# Patient Record
Sex: Female | Born: 1960 | Race: White | Hispanic: No | Smoking: Former smoker
Health system: Southern US, Community
[De-identification: ages and names within clinical notes are randomized; demographics above are authoritative.]

## PROBLEM LIST (undated history)

## (undated) DIAGNOSIS — F419 Anxiety disorder, unspecified: Secondary | ICD-10-CM

## (undated) DIAGNOSIS — E7800 Pure hypercholesterolemia, unspecified: Secondary | ICD-10-CM

## (undated) DIAGNOSIS — I10 Essential (primary) hypertension: Secondary | ICD-10-CM

## (undated) DIAGNOSIS — C801 Malignant (primary) neoplasm, unspecified: Secondary | ICD-10-CM

## (undated) HISTORY — PX: ABDOMINAL HYSTERECTOMY: SHX81

---

## 2001-09-23 ENCOUNTER — Other Ambulatory Visit: Admission: RE | Admit: 2001-09-23 | Discharge: 2001-09-23 | Payer: Self-pay | Admitting: Obstetrics and Gynecology

## 2003-06-29 ENCOUNTER — Encounter: Payer: Self-pay | Admitting: Family Medicine

## 2003-06-29 ENCOUNTER — Encounter: Admission: RE | Admit: 2003-06-29 | Discharge: 2003-06-29 | Payer: Self-pay | Admitting: Family Medicine

## 2004-08-22 ENCOUNTER — Other Ambulatory Visit: Admission: RE | Admit: 2004-08-22 | Discharge: 2004-08-22 | Payer: Self-pay | Admitting: Family Medicine

## 2005-02-26 ENCOUNTER — Ambulatory Visit (HOSPITAL_COMMUNITY): Admission: RE | Admit: 2005-02-26 | Discharge: 2005-02-26 | Payer: Self-pay | Admitting: Family Medicine

## 2005-09-06 ENCOUNTER — Other Ambulatory Visit: Admission: RE | Admit: 2005-09-06 | Discharge: 2005-09-06 | Payer: Self-pay | Admitting: Family Medicine

## 2007-01-08 ENCOUNTER — Other Ambulatory Visit: Admission: RE | Admit: 2007-01-08 | Discharge: 2007-01-08 | Payer: Self-pay | Admitting: Family Medicine

## 2007-12-18 ENCOUNTER — Other Ambulatory Visit: Admission: RE | Admit: 2007-12-18 | Discharge: 2007-12-18 | Payer: Self-pay | Admitting: Family Medicine

## 2008-12-26 ENCOUNTER — Other Ambulatory Visit: Admission: RE | Admit: 2008-12-26 | Discharge: 2008-12-26 | Payer: Self-pay | Admitting: Family Medicine

## 2008-12-27 ENCOUNTER — Encounter: Admission: RE | Admit: 2008-12-27 | Discharge: 2008-12-27 | Payer: Self-pay | Admitting: Family Medicine

## 2010-01-08 ENCOUNTER — Other Ambulatory Visit: Admission: RE | Admit: 2010-01-08 | Discharge: 2010-01-08 | Payer: Self-pay | Admitting: Family Medicine

## 2010-05-22 ENCOUNTER — Encounter: Admission: RE | Admit: 2010-05-22 | Discharge: 2010-05-22 | Payer: Self-pay | Admitting: Family Medicine

## 2011-01-11 ENCOUNTER — Other Ambulatory Visit: Payer: Self-pay | Admitting: Family Medicine

## 2011-01-11 ENCOUNTER — Other Ambulatory Visit (HOSPITAL_COMMUNITY)
Admission: RE | Admit: 2011-01-11 | Discharge: 2011-01-11 | Disposition: A | Discharge status: Home / Self Care | Payer: BC Managed Care – PPO | Source: Ambulatory Visit | Attending: Family Medicine | Admitting: Family Medicine

## 2011-01-11 DIAGNOSIS — Z124 Encounter for screening for malignant neoplasm of cervix: Secondary | ICD-10-CM | POA: Insufficient documentation

## 2011-01-14 ENCOUNTER — Other Ambulatory Visit: Payer: Self-pay | Admitting: Family Medicine

## 2011-01-24 ENCOUNTER — Other Ambulatory Visit: Payer: Self-pay | Admitting: Family Medicine

## 2011-11-21 ENCOUNTER — Emergency Department (HOSPITAL_COMMUNITY)
Admission: EM | Admit: 2011-11-21 | Discharge: 2011-11-21 | Disposition: A | Discharge status: Home / Self Care | Payer: BC Managed Care – PPO | Source: Home / Self Care

## 2011-11-21 ENCOUNTER — Encounter: Payer: Self-pay | Admitting: Emergency Medicine

## 2011-11-21 DIAGNOSIS — B9789 Other viral agents as the cause of diseases classified elsewhere: Secondary | ICD-10-CM

## 2011-11-21 DIAGNOSIS — B349 Viral infection, unspecified: Secondary | ICD-10-CM

## 2011-11-21 HISTORY — DX: Essential (primary) hypertension: I10

## 2011-11-21 HISTORY — DX: Pure hypercholesterolemia, unspecified: E78.00

## 2011-11-21 MED ORDER — GUAIFENESIN-CODEINE 100-10 MG/5ML PO SYRP: ORAL_SOLUTION | ORAL | Status: AC

## 2011-11-21 NOTE — ED Provider Notes (Signed)
Medical screening examination/treatment/procedure(s) were performed by non-physician practitioner and as supervising physician I was immediately available for consultation/collaboration.  Luiz Blare MD   Luiz Blare, MD 11/21/11 [phone]

## 2011-11-21 NOTE — ED Provider Notes (Signed)
History     CSN: [id number] Arrival date & time: 11/21/2011  8:31 AM   None     Chief Complaint  Patient presents with  . Fever    (Consider location/radiation/quality/duration/timing/severity/associated sxs/prior treatment) HPI Comments: Onset of symptoms 3 days ago. C/o body aches, chills, nasal congestion, cough, nausea and decreased appetite. Does not have a thermometer at home to check temp but has felt feverish. Has been taking Alka Seltzer Cold for her symptoms - "does not help."  Patient is a 50 y.o. female presenting with fever. The history is provided by the patient.  Fever Primary symptoms of the febrile illness include fever, fatigue, headaches, cough, nausea and myalgias. Primary symptoms do not include wheezing, shortness of breath, abdominal pain, vomiting, diarrhea or dysuria. The current episode started 3 to 5 days ago. This is a new problem. The problem has been gradually worsening.  The fever began 3 to 5 days ago. The fever has been unchanged since its onset. The maximum temperature recorded prior to her arrival was unknown.  The headache began more than 2 days ago. The headache developed suddenly. Headache is a new problem. The headache is present continuously. The headache is not associated with weakness.  The cough began 3 to 5 days ago. The cough is new. The cough is non-productive.  Myalgias began 3 to 5 days ago. The myalgias have been unchanged since their onset. The myalgias are generalized. The myalgias are aching. The myalgias are not associated with weakness, tenderness or swelling.    Past Medical History  Diagnosis Date  . Hypertension   . Elevated cholesterol     Past Surgical History  Procedure Date  . Abdominal hysterectomy     History reviewed. No pertinent family history.  History  Substance Use Topics  . Smoking status: Current Everyday Smoker -- 0.5 packs/day  . Smokeless tobacco: Not on file  . Alcohol Use: Yes    OB History    Grav Para Term Preterm Abortions TAB SAB Ect Mult Living                  Review of Systems  Constitutional: Positive for fever, chills, appetite change and fatigue.  HENT: Positive for congestion. Negative for ear pain, sore throat, rhinorrhea, postnasal drip and sinus pressure.   Respiratory: Positive for cough. Negative for shortness of breath and wheezing.   Gastrointestinal: Positive for nausea. Negative for vomiting, abdominal pain and diarrhea.  Genitourinary: Negative for dysuria, frequency and decreased urine volume.  Musculoskeletal: Positive for myalgias.  Neurological: Positive for headaches. Negative for weakness.    Allergies  Review of patient's allergies indicates no known allergies.  Home Medications   Current Outpatient Rx  Name Route Sig Dispense Refill  . PRAVASTATIN SODIUM 20 MG PO TABS Oral Take 20 mg by mouth daily.      . TRIAMTERENE-HCTZ 37.5-25 MG PO TABS Oral Take 1 tablet by mouth daily.      . GUAIFENESIN-CODEINE 100-10 MG/5ML PO SYRP  1-2 tsp every 6 hrs prn cough 120 mL 0    BP 137/92  Pulse 109  Temp(Src) 99.1 F (37.3 C) (Oral)  Resp 18  SpO2 96%  Physical Exam  Nursing note and vitals reviewed. Constitutional: She appears well-developed and well-nourished. No distress.  HENT:  Head: Normocephalic and atraumatic.  Right Ear: Tympanic membrane, external ear and ear canal normal.  Left Ear: Tympanic membrane, external ear and ear canal normal.  Nose: Nose normal.  Mouth/Throat: Uvula is  midline, oropharynx is clear and moist and mucous membranes are normal. No oropharyngeal exudate, posterior oropharyngeal edema or posterior oropharyngeal erythema.  Neck: Neck supple.  Cardiovascular: Normal rate, regular rhythm and normal heart sounds.   Pulmonary/Chest: Effort normal and breath sounds normal. No respiratory distress.  Abdominal: Soft. Bowel sounds are normal. She exhibits no distension and no mass. There is no tenderness. There is no  guarding.  Lymphadenopathy:    She has no cervical adenopathy.  Neurological: She is alert.  Skin: Skin is warm and dry.  Psychiatric: She has a normal mood and affect.    ED Course  Procedures (including critical care time)  Labs Reviewed - No data to display No results found.   1. Viral infection       MDM  Flu-like-symptoms w/o documented fever of 100.4 or higher. Discussed with pt viral illness.        Melody Comas, [place] 11/21/11 [phone]

## 2011-11-21 NOTE — ED Notes (Signed)
C/o fever, unsure or degree, not checked at home.  Reports chest congestion, generalized aching, denies n/v, but does not have an appetite.  Denies sore throat, denies ear pain.  C/o headache.  Onset Monday of symptoms.

## 2012-09-07 ENCOUNTER — Other Ambulatory Visit (HOSPITAL_COMMUNITY): Payer: Self-pay | Admitting: Family Medicine

## 2012-09-07 DIAGNOSIS — R0602 Shortness of breath: Secondary | ICD-10-CM

## 2012-09-14 ENCOUNTER — Ambulatory Visit (HOSPITAL_COMMUNITY)
Admission: RE | Admit: 2012-09-14 | Discharge: 2012-09-14 | Disposition: A | Discharge status: Home / Self Care | Payer: BC Managed Care – PPO | Source: Ambulatory Visit | Attending: Family Medicine | Admitting: Family Medicine

## 2012-09-14 DIAGNOSIS — R0602 Shortness of breath: Secondary | ICD-10-CM | POA: Insufficient documentation

## 2012-09-14 MED ORDER — ALBUTEROL SULFATE (5 MG/ML) 0.5% IN NEBU
2.5000 mg | INHALATION_SOLUTION | Freq: Once | RESPIRATORY_TRACT | Status: AC
  Administered 2012-09-14: 2.5 mg via RESPIRATORY_TRACT

## 2014-09-28 ENCOUNTER — Other Ambulatory Visit (HOSPITAL_COMMUNITY)
Admission: RE | Admit: 2014-09-28 | Discharge: 2014-09-28 | Disposition: A | Discharge status: Home / Self Care | Payer: BC Managed Care – PPO | Source: Ambulatory Visit | Attending: Family Medicine | Admitting: Family Medicine

## 2014-09-28 ENCOUNTER — Other Ambulatory Visit: Payer: Self-pay | Admitting: Family Medicine

## 2014-09-28 DIAGNOSIS — Z1272 Encounter for screening for malignant neoplasm of vagina: Secondary | ICD-10-CM | POA: Diagnosis present

## 2014-09-30 LAB — CYTOLOGY - PAP

## 2015-05-30 ENCOUNTER — Other Ambulatory Visit: Payer: Self-pay | Admitting: Radiology

## 2018-09-02 ENCOUNTER — Other Ambulatory Visit: Payer: Self-pay | Admitting: Family Medicine

## 2018-09-02 ENCOUNTER — Other Ambulatory Visit (HOSPITAL_COMMUNITY)
Admission: RE | Admit: 2018-09-02 | Discharge: 2018-09-02 | Disposition: A | Discharge status: Home / Self Care | Payer: 59 | Source: Ambulatory Visit | Attending: Family Medicine | Admitting: Family Medicine

## 2018-09-02 DIAGNOSIS — Z01411 Encounter for gynecological examination (general) (routine) with abnormal findings: Secondary | ICD-10-CM | POA: Diagnosis not present

## 2018-09-04 LAB — CYTOLOGY - PAP
DIAGNOSIS: NEGATIVE
HPV: NOT DETECTED

## 2019-10-20 ENCOUNTER — Other Ambulatory Visit: Payer: Self-pay

## 2019-10-20 DIAGNOSIS — Z20822 Contact with and (suspected) exposure to covid-19: Secondary | ICD-10-CM

## 2019-10-22 LAB — NOVEL CORONAVIRUS, NAA: SARS-CoV-2, NAA: NOT DETECTED

## 2020-02-24 ENCOUNTER — Ambulatory Visit: Payer: 59 | Attending: Internal Medicine

## 2020-02-24 DIAGNOSIS — Z23 Encounter for immunization: Secondary | ICD-10-CM

## 2020-02-24 NOTE — Progress Notes (Signed)
   Covid-19 Vaccination Clinic  Name:  ANZAL BARTNICK    MRN: [id number] DOB: Mar 13, 1961  02/24/2020  Ms. Mathes was observed post Covid-19 immunization for 15 minutes without incident. She was provided with Vaccine Information Sheet and instruction to access the V-Safe system.   Ms. Panella was instructed to call 911 with any severe reactions post vaccine: Marland Kitchen Difficulty breathing  . Swelling of face and throat  . A fast heartbeat  . A bad rash all over body  . Dizziness and weakness   Immunizations Administered    Name Date Dose VIS Date Route   Pfizer COVID-19 Vaccine 02/24/2020  4:42 PM 0.3 mL 11/19/2019 Intramuscular   Manufacturer: [employer], [employer]   Lot: [id number]   NDC: [address]-2

## 2020-03-21 ENCOUNTER — Ambulatory Visit: Payer: 59 | Attending: Internal Medicine

## 2020-03-21 DIAGNOSIS — Z23 Encounter for immunization: Secondary | ICD-10-CM

## 2020-03-21 NOTE — Progress Notes (Signed)
   Covid-19 Vaccination Clinic  Name:  Kristina Howe    MRN: [id number] DOB: [birth year]  03/21/2020  [name] was observed post Covid-19 immunization for 15 minutes without incident. She was provided with Vaccine Information Sheet and instruction to access the V-Safe system.   [name] was instructed to call 911 with any severe reactions post vaccine: Marland Kitchen Difficulty breathing  . Swelling of face and throat  . A fast heartbeat  . A bad rash all over body  . Dizziness and weakness   Immunizations Administered    Name Date Dose VIS Date Route   Pfizer COVID-19 Vaccine 03/21/2020  8:27 AM 0.3 mL 11/19/2019 Intramuscular   Manufacturer: [employer], [employer]   Lot: [id number]   NDC: [address]-4

## 2020-09-07 ENCOUNTER — Other Ambulatory Visit: Payer: Self-pay | Admitting: Family Medicine

## 2020-09-07 ENCOUNTER — Other Ambulatory Visit: Payer: 59

## 2020-09-07 DIAGNOSIS — R1084 Generalized abdominal pain: Secondary | ICD-10-CM

## 2020-09-08 ENCOUNTER — Other Ambulatory Visit: Payer: Self-pay | Admitting: Family Medicine

## 2020-09-08 ENCOUNTER — Ambulatory Visit
Admission: RE | Admit: 2020-09-08 | Discharge: 2020-09-08 | Disposition: A | Discharge status: Home / Self Care | Payer: BC Managed Care – PPO | Source: Ambulatory Visit | Attending: Family Medicine | Admitting: Family Medicine

## 2020-09-08 DIAGNOSIS — N281 Cyst of kidney, acquired: Secondary | ICD-10-CM | POA: Diagnosis not present

## 2020-09-08 DIAGNOSIS — I700 Atherosclerosis of aorta: Secondary | ICD-10-CM | POA: Diagnosis not present

## 2020-09-08 DIAGNOSIS — R1084 Generalized abdominal pain: Secondary | ICD-10-CM

## 2020-09-08 DIAGNOSIS — J439 Emphysema, unspecified: Secondary | ICD-10-CM | POA: Diagnosis not present

## 2020-09-08 DIAGNOSIS — Z90710 Acquired absence of both cervix and uterus: Secondary | ICD-10-CM | POA: Diagnosis not present

## 2020-09-08 MED ORDER — IOPAMIDOL (ISOVUE-300) INJECTION 61%
100.0000 mL | Freq: Once | INTRAVENOUS | Status: AC | PRN
  Administered 2020-09-08: 100 mL via INTRAVENOUS

## 2020-10-05 DIAGNOSIS — Z0000 Encounter for general adult medical examination without abnormal findings: Secondary | ICD-10-CM | POA: Diagnosis not present

## 2020-10-05 DIAGNOSIS — E7800 Pure hypercholesterolemia, unspecified: Secondary | ICD-10-CM | POA: Diagnosis not present

## 2020-10-05 DIAGNOSIS — Z23 Encounter for immunization: Secondary | ICD-10-CM | POA: Diagnosis not present

## 2020-10-05 DIAGNOSIS — I10 Essential (primary) hypertension: Secondary | ICD-10-CM | POA: Diagnosis not present

## 2020-10-05 DIAGNOSIS — M859 Disorder of bone density and structure, unspecified: Secondary | ICD-10-CM | POA: Diagnosis not present

## 2020-12-09 HISTORY — PX: UPPER GI ENDOSCOPY: SHX6162

## 2021-03-01 DIAGNOSIS — H52203 Unspecified astigmatism, bilateral: Secondary | ICD-10-CM | POA: Diagnosis not present

## 2021-03-01 DIAGNOSIS — H524 Presbyopia: Secondary | ICD-10-CM | POA: Diagnosis not present

## 2021-03-01 DIAGNOSIS — H2513 Age-related nuclear cataract, bilateral: Secondary | ICD-10-CM | POA: Diagnosis not present

## 2021-04-05 DIAGNOSIS — E7800 Pure hypercholesterolemia, unspecified: Secondary | ICD-10-CM | POA: Diagnosis not present

## 2021-04-05 DIAGNOSIS — R454 Irritability and anger: Secondary | ICD-10-CM | POA: Diagnosis not present

## 2021-04-05 DIAGNOSIS — F411 Generalized anxiety disorder: Secondary | ICD-10-CM | POA: Diagnosis not present

## 2021-04-05 DIAGNOSIS — E559 Vitamin D deficiency, unspecified: Secondary | ICD-10-CM | POA: Diagnosis not present

## 2021-04-05 DIAGNOSIS — I10 Essential (primary) hypertension: Secondary | ICD-10-CM | POA: Diagnosis not present

## 2021-04-13 DIAGNOSIS — Z23 Encounter for immunization: Secondary | ICD-10-CM | POA: Diagnosis not present

## 2021-06-15 DIAGNOSIS — Z1231 Encounter for screening mammogram for malignant neoplasm of breast: Secondary | ICD-10-CM | POA: Diagnosis not present

## 2021-06-15 DIAGNOSIS — Z780 Asymptomatic menopausal state: Secondary | ICD-10-CM | POA: Diagnosis not present

## 2021-06-15 DIAGNOSIS — M8589 Other specified disorders of bone density and structure, multiple sites: Secondary | ICD-10-CM | POA: Diagnosis not present

## 2021-12-14 ENCOUNTER — Other Ambulatory Visit: Payer: Self-pay | Admitting: Family Medicine

## 2021-12-14 ENCOUNTER — Ambulatory Visit
Admission: RE | Admit: 2021-12-14 | Discharge: 2021-12-14 | Disposition: A | Discharge status: Home / Self Care | Payer: Self-pay | Source: Ambulatory Visit | Attending: Family Medicine | Admitting: Family Medicine

## 2021-12-14 DIAGNOSIS — R079 Chest pain, unspecified: Secondary | ICD-10-CM

## 2021-12-14 DIAGNOSIS — R059 Cough, unspecified: Secondary | ICD-10-CM

## 2022-04-12 ENCOUNTER — Emergency Department (HOSPITAL_BASED_OUTPATIENT_CLINIC_OR_DEPARTMENT_OTHER): Payer: No Typology Code available for payment source

## 2022-04-12 ENCOUNTER — Encounter (HOSPITAL_BASED_OUTPATIENT_CLINIC_OR_DEPARTMENT_OTHER): Payer: Self-pay

## 2022-04-12 ENCOUNTER — Emergency Department (HOSPITAL_BASED_OUTPATIENT_CLINIC_OR_DEPARTMENT_OTHER)
Admission: EM | Admit: 2022-04-12 | Discharge: 2022-04-12 | Disposition: A | Discharge status: Home / Self Care | Payer: No Typology Code available for payment source | Attending: Emergency Medicine | Admitting: Emergency Medicine

## 2022-04-12 ENCOUNTER — Other Ambulatory Visit: Payer: Self-pay

## 2022-04-12 DIAGNOSIS — R7989 Other specified abnormal findings of blood chemistry: Secondary | ICD-10-CM | POA: Insufficient documentation

## 2022-04-12 DIAGNOSIS — R1084 Generalized abdominal pain: Secondary | ICD-10-CM | POA: Insufficient documentation

## 2022-04-12 DIAGNOSIS — R110 Nausea: Secondary | ICD-10-CM | POA: Insufficient documentation

## 2022-04-12 DIAGNOSIS — R1032 Left lower quadrant pain: Secondary | ICD-10-CM | POA: Diagnosis not present

## 2022-04-12 LAB — COMPREHENSIVE METABOLIC PANEL
ALT: 8 U/L (ref 0–44)
AST: 15 U/L (ref 15–41)
Albumin: 4.8 g/dL (ref 3.5–5.0)
Alkaline Phosphatase: 62 U/L (ref 38–126)
Anion gap: 11 (ref 5–15)
BUN: 21 mg/dL — ABNORMAL HIGH (ref 6–20)
CO2: 27 mmol/L (ref 22–32)
Calcium: 10.6 mg/dL — ABNORMAL HIGH (ref 8.9–10.3)
Chloride: 102 mmol/L (ref 98–111)
Creatinine, Ser: 1.21 mg/dL — ABNORMAL HIGH (ref 0.44–1.00)
GFR, Estimated: 52 mL/min — ABNORMAL LOW (ref >60)
Glucose, Bld: 85 mg/dL (ref 70–99)
Potassium: 3.2 mmol/L — ABNORMAL LOW (ref 3.5–5.1)
Sodium: 140 mmol/L (ref 135–145)
Total Bilirubin: 1.1 mg/dL (ref 0.3–1.2)
Total Protein: 7.3 g/dL (ref 6.5–8.1)

## 2022-04-12 LAB — URINALYSIS, ROUTINE W REFLEX MICROSCOPIC
Bilirubin Urine: NEGATIVE
Glucose, UA: NEGATIVE mg/dL
Hgb urine dipstick: NEGATIVE
Ketones, ur: NEGATIVE mg/dL
Leukocytes,Ua: NEGATIVE
Nitrite: NEGATIVE
Protein, ur: NEGATIVE mg/dL
Specific Gravity, Urine: 1.011 (ref 1.005–1.030)
pH: 7 (ref 5.0–8.0)

## 2022-04-12 LAB — LIPASE, BLOOD: Lipase: 11 U/L (ref 11–51)

## 2022-04-12 LAB — CBC
HCT: 38.8 % (ref 36.0–46.0)
Hemoglobin: 13.1 g/dL (ref 12.0–15.0)
MCH: 32.2 pg (ref 26.0–34.0)
MCHC: 33.8 g/dL (ref 30.0–36.0)
MCV: 95.3 fL (ref 80.0–100.0)
Platelets: 290 10*3/uL (ref 150–400)
RBC: 4.07 MIL/uL (ref 3.87–5.11)
RDW: 13.2 % (ref 11.5–15.5)
WBC: 6.5 10*3/uL (ref 4.0–10.5)
nRBC: 0 % (ref 0.0–0.2)

## 2022-04-12 MED ORDER — IOHEXOL 300 MG/ML  SOLN
100.0000 mL | Freq: Once | INTRAMUSCULAR | Status: AC | PRN
  Administered 2022-04-12: 100 mL via INTRAVENOUS

## 2022-04-12 MED ORDER — POTASSIUM CHLORIDE CRYS ER 20 MEQ PO TBCR
40.0000 meq | EXTENDED_RELEASE_TABLET | Freq: Once | ORAL | Status: AC
  Administered 2022-04-12: 40 meq via ORAL
  Filled 2022-04-12: qty 2

## 2022-04-12 MED ORDER — ALUM & MAG HYDROXIDE-SIMETH 200-200-20 MG/5ML PO SUSP
30.0000 mL | Freq: Once | ORAL | Status: AC
  Administered 2022-04-12: 30 mL via ORAL
  Filled 2022-04-12: qty 30

## 2022-04-12 MED ORDER — LIDOCAINE VISCOUS HCL 2 % MT SOLN
15.0000 mL | Freq: Once | OROMUCOSAL | Status: AC
  Administered 2022-04-12: 15 mL via ORAL
  Filled 2022-04-12: qty 15

## 2022-04-12 MED ORDER — OMEPRAZOLE 20 MG PO CPDR: 20.0000 mg | DELAYED_RELEASE_CAPSULE | Freq: Every day | ORAL | 0 refills | Status: DC

## 2022-04-12 NOTE — ED Triage Notes (Signed)
Patient here POV from Home. ? ?Endorses ABD Pain for approximately 1 Month Intermittently. Seen by PCP approximately 1 Week PTA and Lab Studies were normal. Instructed to Seek ED Evaluation if worsened.  ? ?Pain is Mostly oriented to Left ABD. Moderate Nausea with Pain. No Emesis. No Changes in BM. No Known Fevers. No Urinary Symptoms.  ? ?NAD Noted during Triage. A&Ox4. GCS 15. Ambulatory.  ?

## 2022-04-12 NOTE — ED Provider Notes (Signed)
?[facility] ?Provider Note ? ? ?CSN: [id number] ?Arrival date & time: 04/12/22  1045 ? ?  ? ?History ? ?Chief Complaint  ?Patient presents with  ? Abdominal Pain  ? ? ?Kristina Howe is a 61 y.o. female. ? ?Patient with history of melanoma and hypertension presents today with complaints of abdominal pain. She states that same has been present over the past few months but has worsened in the past week. She saw her PCP for same last week and had labs drawn which were normal, was told her pain could be stress related. States that since then her pain has become more frequent and severe. Pain is localized to the LLQ and is associated with nausea and 'feeling a need to vomit' without being able to do so. She denies any diarrhea, is having regular nonbloody bowel movements. Also denies hematuria, dysuria, hematochezia, or melena. Does state that her appetite has been poor recently due to pain. States that she has had a hysterectomy in the past, no other history of abdominal surgeries.  ? ?The history is provided by the patient. No language interpreter was used.  ?Abdominal Pain ?Associated symptoms: nausea   ?Associated symptoms: no chills, no constipation, no diarrhea, no dysuria, no fever and no vomiting   ? ?  ? ?Home Medications ?Prior to Admission medications   ?Medication Sig Start Date End Date Taking? Authorizing Provider  ?pravastatin (PRAVACHOL) 20 MG tablet Take 20 mg by mouth daily.      [provider]  ?triamterene-hydrochlorothiazide (MAXZIDE-25) 37.5-25 MG per tablet Take 1 tablet by mouth daily.      [provider]  ?   ? ?Allergies    ?Patient has no known allergies.   ? ?Review of Systems   ?Review of Systems  ?Constitutional:  Negative for chills and fever.  ?Gastrointestinal:  Positive for abdominal pain and nausea. Negative for constipation, diarrhea and vomiting.  ?Genitourinary:  Negative for dysuria.  ?All other systems reviewed and are  negative. ? ?Physical Exam ?Updated Vital Signs ?BP 137/81   Pulse (!) 59   Temp 97.9 ?F (36.6 ?C) (Oral)   Resp 17   Ht 5\' 9"  (1.753 m)   Wt 52.2 kg   SpO2 99%   BMI 16.98 kg/m?  ?Physical Exam ?Vitals and nursing note reviewed.  ?Constitutional:   ?   General: She is not in acute distress. ?   Appearance: Normal appearance. She is well-developed and normal weight. She is not ill-appearing, toxic-appearing or diaphoretic.  ?HENT:  ?   Head: Normocephalic and atraumatic.  ?Cardiovascular:  ?   Rate and Rhythm: Normal rate.  ?Pulmonary:  ?   Effort: Pulmonary effort is normal. No respiratory distress.  ?Abdominal:  ?   General: Abdomen is flat.  ?   Palpations: Abdomen is soft.  ?   Tenderness: There is abdominal tenderness in the left lower quadrant.  ?Musculoskeletal:     ?   General: Normal range of motion.  ?   Cervical back: Normal range of motion.  ?Skin: ?   General: Skin is warm and dry.  ?Neurological:  ?   General: No focal deficit present.  ?   Mental Status: She is alert.  ?Psychiatric:     ?   Mood and Affect: Mood normal.     ?   Behavior: Behavior normal.  ? ? ?ED Results / Procedures / Treatments   ?Labs ?(all labs ordered are listed, but only abnormal results are  displayed) ?Labs Reviewed  ?COMPREHENSIVE METABOLIC PANEL - Abnormal; Notable for the following components:  ?    Result Value  ? Potassium 3.2 (*)   ? BUN 21 (*)   ? Creatinine, Ser 1.21 (*)   ? Calcium 10.6 (*)   ? GFR, Estimated 52 (*)   ? All other components within normal limits  ?LIPASE, BLOOD  ?CBC  ?URINALYSIS, ROUTINE W REFLEX MICROSCOPIC  ? ? ?EKG ?None ? ?Radiology ?CT ABDOMEN PELVIS W CONTRAST ? ?Result Date: 04/12/2022 ?CLINICAL DATA:  Left lower quadrant pain for several days. EXAM: CT ABDOMEN AND PELVIS WITH CONTRAST TECHNIQUE: Multidetector CT imaging of the abdomen and pelvis was performed using the standard protocol following bolus administration of intravenous contrast. RADIATION DOSE REDUCTION: This exam was  performed according to the departmental dose-optimization program which includes automated exposure control, adjustment of the mA and/or kV according to patient size and/or use of iterative reconstruction technique. CONTRAST:  OMNIPAQUE IOHEXOL 300 MG/ML  SOLN COMPARISON:  09/08/2020 FINDINGS: Lower Chest: No acute findings. Hepatobiliary: A 1.5 cm low-attenuation lesion is seen in the anterior dome of the left hepatic lobe which was not seen on prior study. This has nonspecific characteristics. No other liver lesions identified. Gallbladder is unremarkable. No evidence of biliary ductal dilatation. Pancreas:  No mass or inflammatory changes. Spleen: Within normal limits in size and appearance. Adrenals/Urinary Tract: No masses identified. Tiny benign left renal cysts again noted (no followup imaging is recommended). No evidence of ureteral calculi or hydronephrosis. Stomach/Bowel: Mild gastric wall thickening is seen with a focal outpouching from the lesser curvature of the proximal gastric body measuring approximately 1 cm on image 51/5, which could represent a small ulcer or diverticulum. No evidence of bowel obstruction or other inflammatory process. No abnormal fluid collections. Vascular/Lymphatic: No pathologically enlarged lymph nodes. No acute vascular findings. Aortic atherosclerotic calcification noted. Reproductive: Prior hysterectomy noted. Adnexal regions are unremarkable in appearance. Other:  None. Musculoskeletal:  No suspicious bone lesions identified. IMPRESSION: 1 cm focal outpouching from the lesser curvature of the proximal gastric body, which could represent a gastric ulcer or diverticulum. Consider upper endoscopy for further evaluation. 1.5 cm indeterminate low-attenuation lesion in the anterior dome of the left hepatic lobe, not seen on prior exam. Abdomen MRI without and with contrast is recommended for further characterization. Aortic Atherosclerosis (ICD10-I70.0). Electronically  Signed   By: Danae Orleans M.D.   On: 04/12/2022 13:18   ? ?Procedures ?Procedures  ? ? ?Medications Ordered in ED ?Medications  ?potassium chloride SA (KLOR-CON M) CR tablet 40 mEq (has no administration in time range)  ? ? ?ED Course/ Medical Decision Making/ A&P ?  ?                        ?Medical Decision Making ?Amount and/or Complexity of Data Reviewed ?Labs: ordered. ?Radiology: ordered. ? ?Risk ?OTC drugs. ?Prescription drug management. ? ? ?This patient presents to the ED for concern of abdominal pain, this involves an extensive number of treatment options, and is a complaint that carries with it a high risk of complications and morbidity. ? ? ?Co morbidities that complicate the patient evaluation ? ?Hypertension, hyperlipidemia ? ? ?Lab Tests: ? ?I Ordered, and personally interpreted labs.  The pertinent results include:  K 3.2, mild creatinine elevation at 1.21. No leukocytosis or anemia. No other acute laboratory findings ? ?Imaging Studies ordered: ? ?I ordered imaging studies including CT abdomen pelvis with contrast  ?I  independently visualized and interpreted imaging which showed  ?1 cm focal outpouching from the lesser curvature of the proximal gastric body, which could represent a gastric ulcer or diverticulum. Consider upper endoscopy for further evaluation. ?1.5 cm indeterminate low-attenuation lesion in the anterior dome of the left hepatic lobe, not seen on prior exam. Abdomen MRI without and with contrast is recommended for further characterization.  ?I agree with the radiologist interpretation ? ? ?Problem List / ED Course / Critical interventions / Medication management ? ?I ordered medication including GI cocktail and oral potassium  for reflux and hypokalemia  ?Reevaluation of the patient after these medicines showed that the patient improved ?I have reviewed the patients home medicines and have made adjustments as needed ? ?Patient presents today with complaints of abdominal pain and  nausea for the past several months. She is afebrile, non-toxic appearing, and in no acute distress with reassuring vital signs. Patient's pain and other symptoms adequately managed in emergency department. Labs, imaging and vitals review

## 2022-04-12 NOTE — Discharge Instructions (Addendum)
As we discussed, your work-up in the ER today was reassuring for acute abnormalities.  Given that you did have some relief with the liquid suspension that you were given in the ER today I have some suspicion that your symptoms may be related to reflux.  I have therefore given you a prescription for Prilosec which is an acid reducing medication for you to take as prescribed 30 minutes before your biggest meal of the day.  I have also given you a referral to a gastroenterologist with a number to call for you to schedule an appointment with for further evaluation and management. ? ?Return if development of any new or worsening symptoms. ?

## 2022-12-09 HISTORY — PX: COLONOSCOPY: SHX174

## 2023-02-17 DIAGNOSIS — Z1211 Encounter for screening for malignant neoplasm of colon: Secondary | ICD-10-CM | POA: Diagnosis not present

## 2023-02-17 DIAGNOSIS — K648 Other hemorrhoids: Secondary | ICD-10-CM | POA: Diagnosis not present

## 2023-03-22 DIAGNOSIS — D485 Neoplasm of uncertain behavior of skin: Secondary | ICD-10-CM | POA: Diagnosis not present

## 2023-03-22 DIAGNOSIS — C44529 Squamous cell carcinoma of skin of other part of trunk: Secondary | ICD-10-CM | POA: Diagnosis not present

## 2023-03-22 DIAGNOSIS — Z08 Encounter for follow-up examination after completed treatment for malignant neoplasm: Secondary | ICD-10-CM | POA: Diagnosis not present

## 2023-03-22 DIAGNOSIS — C44729 Squamous cell carcinoma of skin of left lower limb, including hip: Secondary | ICD-10-CM | POA: Diagnosis not present

## 2023-03-22 DIAGNOSIS — R229 Localized swelling, mass and lump, unspecified: Secondary | ICD-10-CM | POA: Diagnosis not present

## 2023-03-22 DIAGNOSIS — D229 Melanocytic nevi, unspecified: Secondary | ICD-10-CM | POA: Diagnosis not present

## 2023-03-22 DIAGNOSIS — Z85828 Personal history of other malignant neoplasm of skin: Secondary | ICD-10-CM | POA: Diagnosis not present

## 2023-03-22 DIAGNOSIS — L821 Other seborrheic keratosis: Secondary | ICD-10-CM | POA: Diagnosis not present

## 2023-03-22 DIAGNOSIS — C44519 Basal cell carcinoma of skin of other part of trunk: Secondary | ICD-10-CM | POA: Diagnosis not present

## 2023-03-22 DIAGNOSIS — C44521 Squamous cell carcinoma of skin of breast: Secondary | ICD-10-CM | POA: Diagnosis not present

## 2023-03-22 DIAGNOSIS — L814 Other melanin hyperpigmentation: Secondary | ICD-10-CM | POA: Diagnosis not present

## 2023-04-29 DIAGNOSIS — F17200 Nicotine dependence, unspecified, uncomplicated: Secondary | ICD-10-CM | POA: Diagnosis not present

## 2023-04-29 DIAGNOSIS — E7800 Pure hypercholesterolemia, unspecified: Secondary | ICD-10-CM | POA: Diagnosis not present

## 2023-04-29 DIAGNOSIS — R454 Irritability and anger: Secondary | ICD-10-CM | POA: Diagnosis not present

## 2023-04-29 DIAGNOSIS — F411 Generalized anxiety disorder: Secondary | ICD-10-CM | POA: Diagnosis not present

## 2023-04-29 DIAGNOSIS — M8580 Other specified disorders of bone density and structure, unspecified site: Secondary | ICD-10-CM | POA: Diagnosis not present

## 2023-04-29 DIAGNOSIS — I10 Essential (primary) hypertension: Secondary | ICD-10-CM | POA: Diagnosis not present

## 2023-04-29 DIAGNOSIS — E559 Vitamin D deficiency, unspecified: Secondary | ICD-10-CM | POA: Diagnosis not present

## 2023-05-16 DIAGNOSIS — L905 Scar conditions and fibrosis of skin: Secondary | ICD-10-CM | POA: Diagnosis not present

## 2023-05-16 DIAGNOSIS — C44519 Basal cell carcinoma of skin of other part of trunk: Secondary | ICD-10-CM | POA: Diagnosis not present

## 2023-05-16 DIAGNOSIS — D045 Carcinoma in situ of skin of trunk: Secondary | ICD-10-CM | POA: Diagnosis not present

## 2023-05-16 DIAGNOSIS — D0472 Carcinoma in situ of skin of left lower limb, including hip: Secondary | ICD-10-CM | POA: Diagnosis not present

## 2023-06-20 IMAGING — CT CT ABD-PELV W/ CM
2 of 5 series · 16 of 46 positions shown, 18 images · IV contrast (APPLIED)
Comparison: 09/08/2020

CLINICAL DATA: Left lower quadrant pain for several days.

EXAM:
CT ABDOMEN AND PELVIS WITH CONTRAST
TECHNIQUE: Multidetector CT imaging of the abdomen and pelvis was performed
using the standard protocol following bolus administration of
intravenous contrast.

[Series 2: abd pel w · axial · 0.64mm/px · z∈[+656,+1026]mm · 13 of 84 slices shown, 15 images]
[im 5/84  soft-tissue]
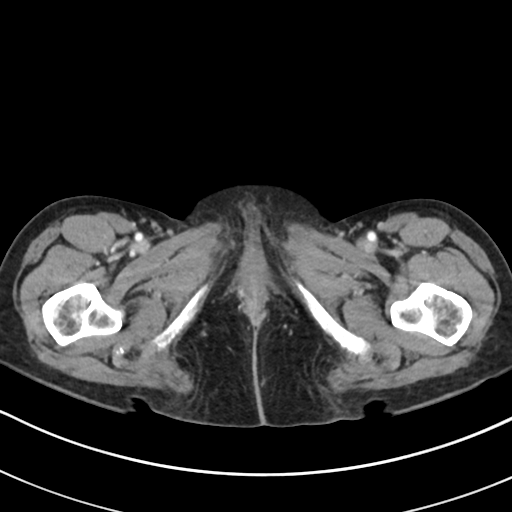
[im 5/84  bone]
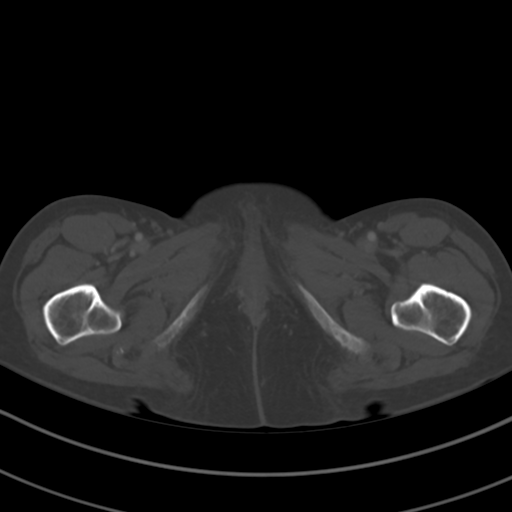
[im 10/84  soft-tissue]
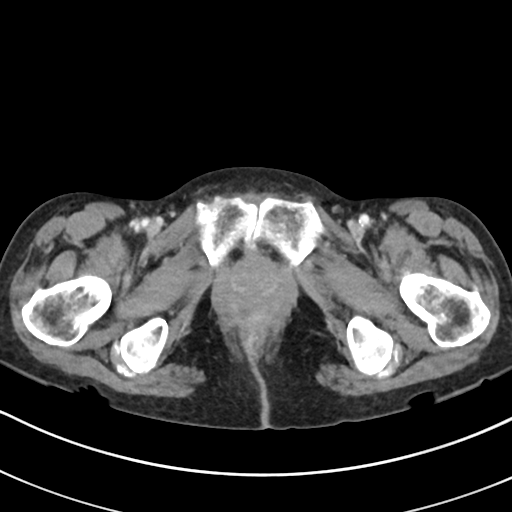
[im 19/84  soft-tissue]
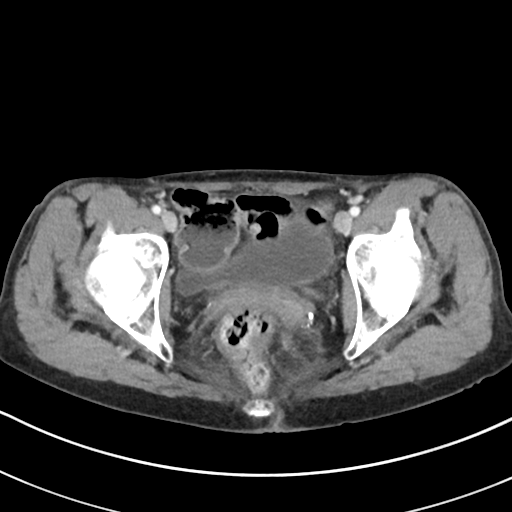
[im 24/84  soft-tissue]
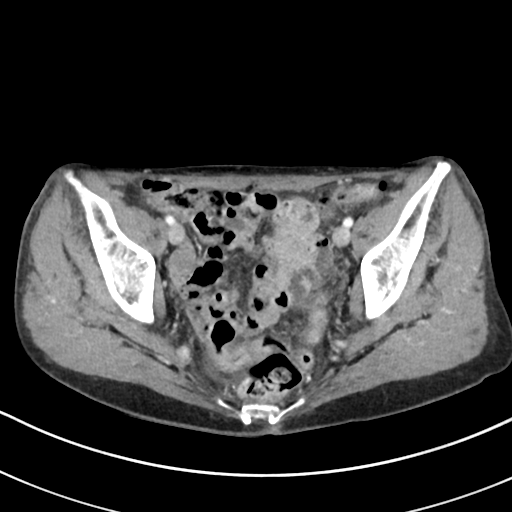
[im 28/84  soft-tissue]
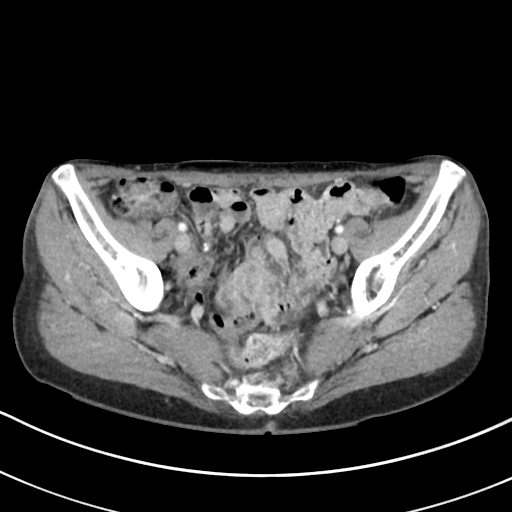
[im 37/84  soft-tissue]
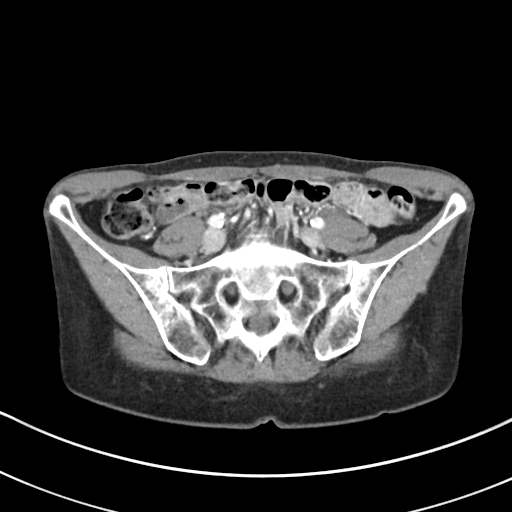
[im 42/84  soft-tissue]
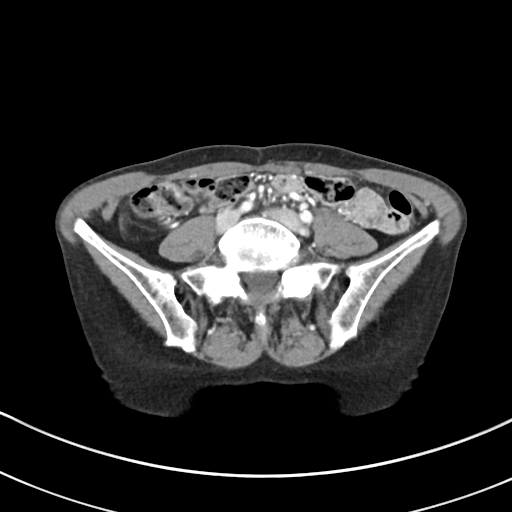
[im 47/84  soft-tissue]
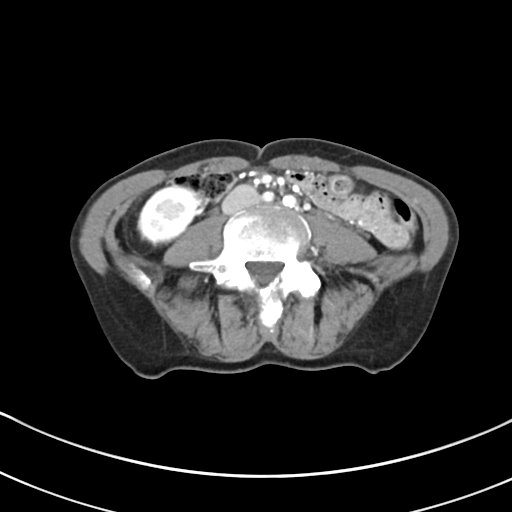
[im 56/84  soft-tissue]
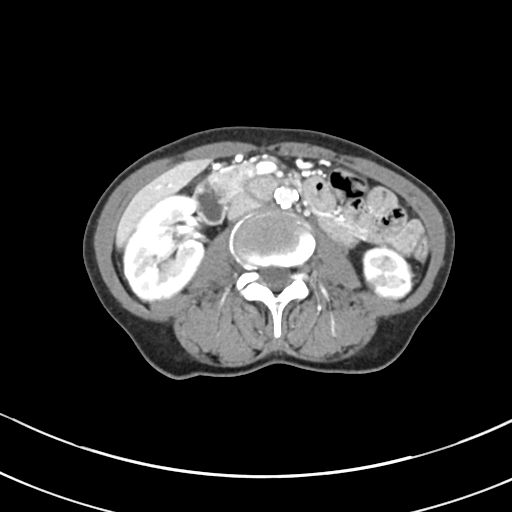
[im 56/84  bone]
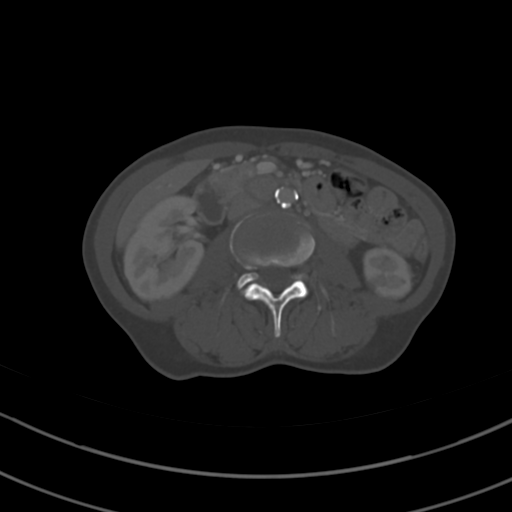
[im 60/84  soft-tissue]
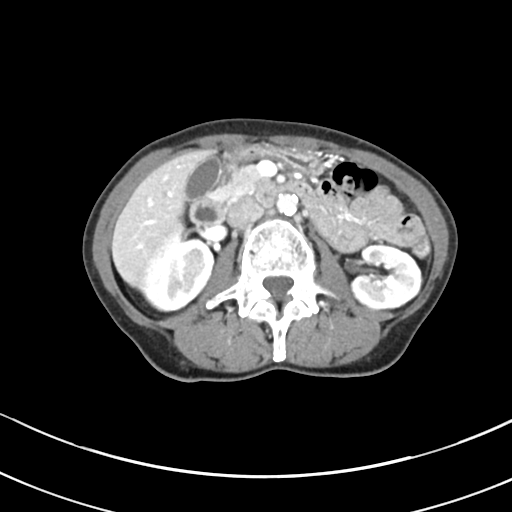
[im 65/84  soft-tissue]
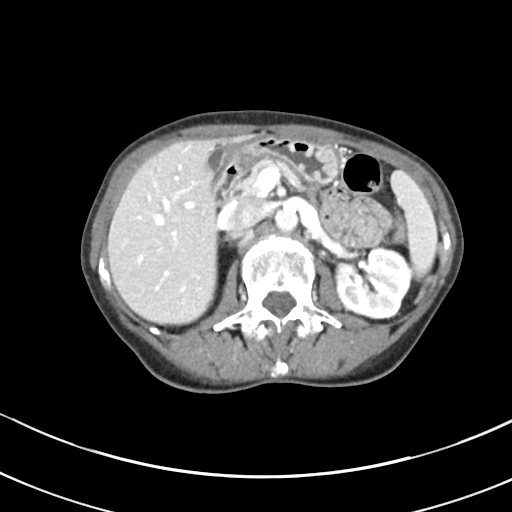
[im 74/84  soft-tissue]
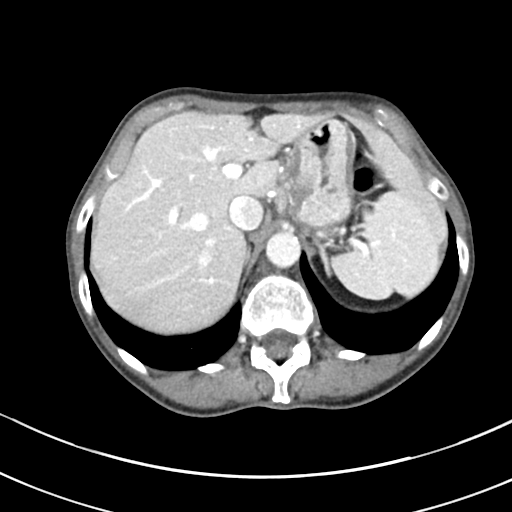
[im 79/84  soft-tissue]
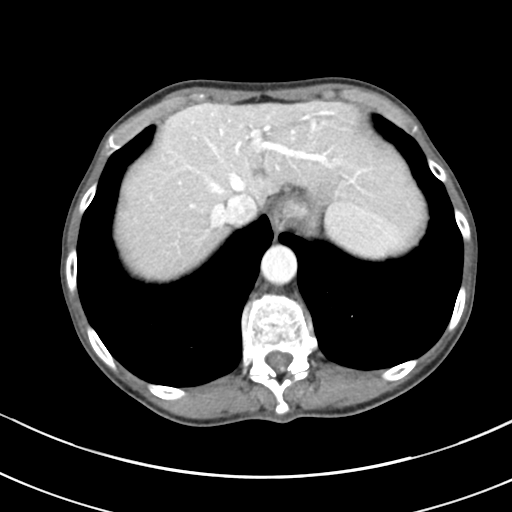

[Series 5: coronal · coronal · 0.70mm/px · 3 of 68 slices shown]
[im 23/68  soft-tissue]
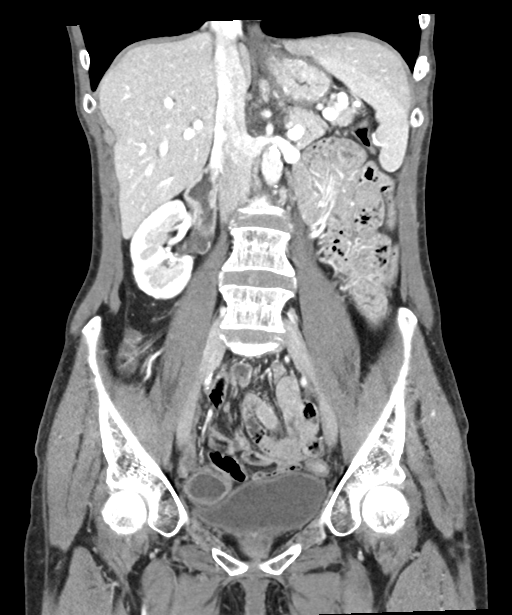
[im 30/68  soft-tissue]
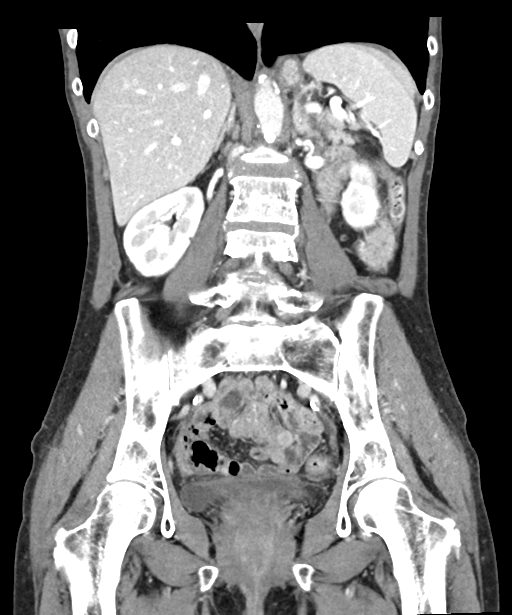
[im 38/68  soft-tissue]
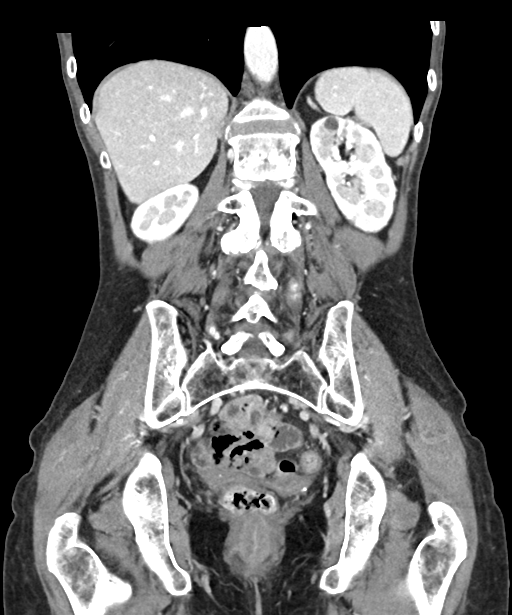

[16 of 46 positions shown; findings below may reference images not displayed]

RADIATION DOSE REDUCTION: This exam was performed according to the
departmental dose-optimization program which includes automated
exposure control, adjustment of the mA and/or kV according to
patient size and/or use of iterative reconstruction technique.

CONTRAST:  100mL OMNIPAQUE IOHEXOL 300 MG/ML  SOLN
FINDINGS: Lower Chest: No acute findings.

Hepatobiliary: A 1.5 cm low-attenuation lesion is seen in the
anterior dome of the left hepatic lobe which was not seen on prior
study. This has nonspecific characteristics. No other liver lesions
identified. Gallbladder is unremarkable. No evidence of biliary
ductal dilatation.

Pancreas:  No mass or inflammatory changes.

Spleen: Within normal limits in size and appearance.

Adrenals/Urinary Tract: No masses identified. Tiny benign left renal
cysts again noted (no followup imaging is recommended). No evidence
of ureteral calculi or hydronephrosis.

Stomach/Bowel: Mild gastric wall thickening is seen with a focal
outpouching from the lesser curvature of the proximal gastric body
measuring approximately 1 cm on image 51/5, which could represent a
small ulcer or diverticulum. No evidence of bowel obstruction or
other inflammatory process. No abnormal fluid collections.

Vascular/Lymphatic: No pathologically enlarged lymph nodes. No acute
vascular findings. Aortic atherosclerotic calcification noted.

Reproductive: Prior hysterectomy noted. Adnexal regions are
unremarkable in appearance.

Other:  None.

Musculoskeletal:  No suspicious bone lesions identified.
IMPRESSION: 1 cm focal outpouching from the lesser curvature of the proximal
gastric body, which could represent a gastric ulcer or diverticulum.
Consider upper endoscopy for further evaluation.

1.5 cm indeterminate low-attenuation lesion in the anterior dome of
the left hepatic lobe, not seen on prior exam. Abdomen MRI without
and with contrast is recommended for further characterization.

Aortic Atherosclerosis ([id number]-TN4.4).

## 2023-07-04 DIAGNOSIS — Z1231 Encounter for screening mammogram for malignant neoplasm of breast: Secondary | ICD-10-CM | POA: Diagnosis not present

## 2023-07-10 DIAGNOSIS — L821 Other seborrheic keratosis: Secondary | ICD-10-CM | POA: Diagnosis not present

## 2023-07-10 DIAGNOSIS — R229 Localized swelling, mass and lump, unspecified: Secondary | ICD-10-CM | POA: Diagnosis not present

## 2023-07-10 DIAGNOSIS — C44519 Basal cell carcinoma of skin of other part of trunk: Secondary | ICD-10-CM | POA: Diagnosis not present

## 2023-07-10 DIAGNOSIS — D2372 Other benign neoplasm of skin of left lower limb, including hip: Secondary | ICD-10-CM | POA: Diagnosis not present

## 2023-07-10 DIAGNOSIS — Z08 Encounter for follow-up examination after completed treatment for malignant neoplasm: Secondary | ICD-10-CM | POA: Diagnosis not present

## 2023-07-10 DIAGNOSIS — D485 Neoplasm of uncertain behavior of skin: Secondary | ICD-10-CM | POA: Diagnosis not present

## 2023-07-10 DIAGNOSIS — Z85828 Personal history of other malignant neoplasm of skin: Secondary | ICD-10-CM | POA: Diagnosis not present

## 2023-07-10 DIAGNOSIS — L570 Actinic keratosis: Secondary | ICD-10-CM | POA: Diagnosis not present

## 2023-07-10 DIAGNOSIS — C44529 Squamous cell carcinoma of skin of other part of trunk: Secondary | ICD-10-CM | POA: Diagnosis not present

## 2023-07-10 DIAGNOSIS — C44521 Squamous cell carcinoma of skin of breast: Secondary | ICD-10-CM | POA: Diagnosis not present

## 2023-08-08 DIAGNOSIS — D045 Carcinoma in situ of skin of trunk: Secondary | ICD-10-CM | POA: Diagnosis not present

## 2023-08-22 DIAGNOSIS — D045 Carcinoma in situ of skin of trunk: Secondary | ICD-10-CM | POA: Diagnosis not present

## 2023-10-16 DIAGNOSIS — M62830 Muscle spasm of back: Secondary | ICD-10-CM | POA: Diagnosis not present

## 2023-11-04 DIAGNOSIS — E118 Type 2 diabetes mellitus with unspecified complications: Secondary | ICD-10-CM | POA: Diagnosis not present

## 2023-11-04 DIAGNOSIS — N1831 Chronic kidney disease, stage 3a: Secondary | ICD-10-CM | POA: Diagnosis not present

## 2023-11-04 DIAGNOSIS — F411 Generalized anxiety disorder: Secondary | ICD-10-CM | POA: Diagnosis not present

## 2023-11-20 DIAGNOSIS — L821 Other seborrheic keratosis: Secondary | ICD-10-CM | POA: Diagnosis not present

## 2023-11-20 DIAGNOSIS — D2372 Other benign neoplasm of skin of left lower limb, including hip: Secondary | ICD-10-CM | POA: Diagnosis not present

## 2023-11-20 DIAGNOSIS — D0471 Carcinoma in situ of skin of right lower limb, including hip: Secondary | ICD-10-CM | POA: Diagnosis not present

## 2023-11-20 DIAGNOSIS — R229 Localized swelling, mass and lump, unspecified: Secondary | ICD-10-CM | POA: Diagnosis not present

## 2023-11-20 DIAGNOSIS — Z85828 Personal history of other malignant neoplasm of skin: Secondary | ICD-10-CM | POA: Diagnosis not present

## 2023-11-20 DIAGNOSIS — Z08 Encounter for follow-up examination after completed treatment for malignant neoplasm: Secondary | ICD-10-CM | POA: Diagnosis not present

## 2023-11-20 DIAGNOSIS — C44519 Basal cell carcinoma of skin of other part of trunk: Secondary | ICD-10-CM | POA: Diagnosis not present

## 2023-11-20 DIAGNOSIS — D485 Neoplasm of uncertain behavior of skin: Secondary | ICD-10-CM | POA: Diagnosis not present

## 2023-11-20 DIAGNOSIS — C44529 Squamous cell carcinoma of skin of other part of trunk: Secondary | ICD-10-CM | POA: Diagnosis not present

## 2023-11-20 DIAGNOSIS — D0472 Carcinoma in situ of skin of left lower limb, including hip: Secondary | ICD-10-CM | POA: Diagnosis not present

## 2023-11-20 DIAGNOSIS — D0461 Carcinoma in situ of skin of right upper limb, including shoulder: Secondary | ICD-10-CM | POA: Diagnosis not present

## 2023-11-20 DIAGNOSIS — L570 Actinic keratosis: Secondary | ICD-10-CM | POA: Diagnosis not present

## 2023-12-11 DIAGNOSIS — R1033 Periumbilical pain: Secondary | ICD-10-CM | POA: Diagnosis not present

## 2023-12-16 DIAGNOSIS — D0461 Carcinoma in situ of skin of right upper limb, including shoulder: Secondary | ICD-10-CM | POA: Diagnosis not present

## 2023-12-19 ENCOUNTER — Other Ambulatory Visit (HOSPITAL_BASED_OUTPATIENT_CLINIC_OR_DEPARTMENT_OTHER): Payer: Self-pay | Admitting: Gastroenterology

## 2023-12-19 DIAGNOSIS — R1084 Generalized abdominal pain: Secondary | ICD-10-CM | POA: Diagnosis not present

## 2023-12-19 DIAGNOSIS — R634 Abnormal weight loss: Secondary | ICD-10-CM | POA: Diagnosis not present

## 2023-12-23 ENCOUNTER — Other Ambulatory Visit: Payer: Self-pay | Admitting: Gastroenterology

## 2023-12-23 DIAGNOSIS — R634 Abnormal weight loss: Secondary | ICD-10-CM

## 2023-12-23 DIAGNOSIS — D0472 Carcinoma in situ of skin of left lower limb, including hip: Secondary | ICD-10-CM | POA: Diagnosis not present

## 2023-12-23 DIAGNOSIS — R1084 Generalized abdominal pain: Secondary | ICD-10-CM

## 2023-12-24 ENCOUNTER — Ambulatory Visit (HOSPITAL_BASED_OUTPATIENT_CLINIC_OR_DEPARTMENT_OTHER): Payer: No Typology Code available for payment source

## 2023-12-24 ENCOUNTER — Encounter: Payer: Self-pay | Admitting: Gastroenterology

## 2023-12-24 ENCOUNTER — Encounter (HOSPITAL_BASED_OUTPATIENT_CLINIC_OR_DEPARTMENT_OTHER): Payer: Self-pay

## 2023-12-25 ENCOUNTER — Ambulatory Visit
Admission: RE | Admit: 2023-12-25 | Discharge: 2023-12-25 | Disposition: A | Discharge status: Home / Self Care | Payer: 59 | Source: Ambulatory Visit | Attending: Gastroenterology | Admitting: Gastroenterology

## 2023-12-25 DIAGNOSIS — R634 Abnormal weight loss: Secondary | ICD-10-CM

## 2023-12-25 DIAGNOSIS — R109 Unspecified abdominal pain: Secondary | ICD-10-CM | POA: Diagnosis not present

## 2023-12-25 DIAGNOSIS — N2889 Other specified disorders of kidney and ureter: Secondary | ICD-10-CM | POA: Diagnosis not present

## 2023-12-25 DIAGNOSIS — R1084 Generalized abdominal pain: Secondary | ICD-10-CM

## 2023-12-25 DIAGNOSIS — I700 Atherosclerosis of aorta: Secondary | ICD-10-CM | POA: Diagnosis not present

## 2023-12-25 MED ORDER — IOPAMIDOL (ISOVUE-300) INJECTION 61%
100.0000 mL | Freq: Once | INTRAVENOUS | Status: AC | PRN
  Administered 2023-12-25: 100 mL via INTRAVENOUS

## 2023-12-31 ENCOUNTER — Other Ambulatory Visit: Payer: 59

## 2024-01-16 DIAGNOSIS — Z85828 Personal history of other malignant neoplasm of skin: Secondary | ICD-10-CM | POA: Diagnosis not present

## 2024-01-16 DIAGNOSIS — Z634 Disappearance and death of family member: Secondary | ICD-10-CM | POA: Diagnosis not present

## 2024-01-16 DIAGNOSIS — R634 Abnormal weight loss: Secondary | ICD-10-CM | POA: Diagnosis not present

## 2024-01-16 DIAGNOSIS — Z79899 Other long term (current) drug therapy: Secondary | ICD-10-CM | POA: Diagnosis not present

## 2024-01-16 DIAGNOSIS — F419 Anxiety disorder, unspecified: Secondary | ICD-10-CM | POA: Diagnosis not present

## 2024-01-16 DIAGNOSIS — N289 Disorder of kidney and ureter, unspecified: Secondary | ICD-10-CM | POA: Diagnosis not present

## 2024-01-16 DIAGNOSIS — M545 Low back pain, unspecified: Secondary | ICD-10-CM | POA: Diagnosis not present

## 2024-01-16 DIAGNOSIS — G8929 Other chronic pain: Secondary | ICD-10-CM | POA: Diagnosis not present

## 2024-01-16 DIAGNOSIS — E785 Hyperlipidemia, unspecified: Secondary | ICD-10-CM | POA: Diagnosis not present

## 2024-01-16 DIAGNOSIS — I10 Essential (primary) hypertension: Secondary | ICD-10-CM | POA: Diagnosis not present

## 2024-01-19 ENCOUNTER — Other Ambulatory Visit: Payer: Self-pay | Admitting: Nurse Practitioner

## 2024-01-19 DIAGNOSIS — N289 Disorder of kidney and ureter, unspecified: Secondary | ICD-10-CM

## 2024-01-28 DIAGNOSIS — D0471 Carcinoma in situ of skin of right lower limb, including hip: Secondary | ICD-10-CM | POA: Diagnosis not present

## 2024-02-04 ENCOUNTER — Other Ambulatory Visit: Payer: 59

## 2024-02-04 ENCOUNTER — Ambulatory Visit
Admission: RE | Admit: 2024-02-04 | Discharge: 2024-02-04 | Disposition: A | Discharge status: Home / Self Care | Payer: 59 | Source: Ambulatory Visit | Attending: Nurse Practitioner | Admitting: Nurse Practitioner

## 2024-02-04 DIAGNOSIS — N281 Cyst of kidney, acquired: Secondary | ICD-10-CM | POA: Diagnosis not present

## 2024-02-04 DIAGNOSIS — N289 Disorder of kidney and ureter, unspecified: Secondary | ICD-10-CM

## 2024-02-04 MED ORDER — GADOPICLENOL 0.5 MMOL/ML IV SOLN
5.0000 mL | Freq: Once | INTRAVENOUS | Status: AC | PRN
  Administered 2024-02-04: 5 mL via INTRAVENOUS

## 2024-02-20 DIAGNOSIS — M62830 Muscle spasm of back: Secondary | ICD-10-CM | POA: Diagnosis not present

## 2024-03-07 ENCOUNTER — Inpatient Hospital Stay (HOSPITAL_BASED_OUTPATIENT_CLINIC_OR_DEPARTMENT_OTHER)
Admission: EM | Admit: 2024-03-07 | Discharge: 2024-03-12 | DRG: 252 | Disposition: A | Discharge status: Home / Self Care | Attending: Internal Medicine | Admitting: Internal Medicine

## 2024-03-07 ENCOUNTER — Encounter (HOSPITAL_BASED_OUTPATIENT_CLINIC_OR_DEPARTMENT_OTHER): Payer: Self-pay

## 2024-03-07 ENCOUNTER — Emergency Department (HOSPITAL_BASED_OUTPATIENT_CLINIC_OR_DEPARTMENT_OTHER)

## 2024-03-07 DIAGNOSIS — I708 Atherosclerosis of other arteries: Secondary | ICD-10-CM | POA: Diagnosis not present

## 2024-03-07 DIAGNOSIS — E7800 Pure hypercholesterolemia, unspecified: Secondary | ICD-10-CM | POA: Diagnosis present

## 2024-03-07 DIAGNOSIS — M79644 Pain in right finger(s): Secondary | ICD-10-CM | POA: Diagnosis not present

## 2024-03-07 DIAGNOSIS — I10 Essential (primary) hypertension: Secondary | ICD-10-CM | POA: Diagnosis not present

## 2024-03-07 DIAGNOSIS — R230 Cyanosis: Secondary | ICD-10-CM | POA: Diagnosis present

## 2024-03-07 DIAGNOSIS — R531 Weakness: Secondary | ICD-10-CM | POA: Diagnosis present

## 2024-03-07 DIAGNOSIS — I700 Atherosclerosis of aorta: Secondary | ICD-10-CM | POA: Diagnosis not present

## 2024-03-07 DIAGNOSIS — Z90710 Acquired absence of both cervix and uterus: Secondary | ICD-10-CM

## 2024-03-07 DIAGNOSIS — D62 Acute posthemorrhagic anemia: Secondary | ICD-10-CM | POA: Diagnosis not present

## 2024-03-07 DIAGNOSIS — I2699 Other pulmonary embolism without acute cor pulmonale: Secondary | ICD-10-CM | POA: Diagnosis present

## 2024-03-07 DIAGNOSIS — R202 Paresthesia of skin: Secondary | ICD-10-CM | POA: Diagnosis present

## 2024-03-07 DIAGNOSIS — F419 Anxiety disorder, unspecified: Secondary | ICD-10-CM | POA: Diagnosis not present

## 2024-03-07 DIAGNOSIS — R590 Localized enlarged lymph nodes: Secondary | ICD-10-CM | POA: Diagnosis not present

## 2024-03-07 DIAGNOSIS — I6501 Occlusion and stenosis of right vertebral artery: Secondary | ICD-10-CM | POA: Diagnosis present

## 2024-03-07 DIAGNOSIS — R6252 Short stature (child): Secondary | ICD-10-CM | POA: Diagnosis present

## 2024-03-07 DIAGNOSIS — R918 Other nonspecific abnormal finding of lung field: Secondary | ICD-10-CM | POA: Diagnosis not present

## 2024-03-07 DIAGNOSIS — F17210 Nicotine dependence, cigarettes, uncomplicated: Secondary | ICD-10-CM | POA: Diagnosis not present

## 2024-03-07 DIAGNOSIS — J439 Emphysema, unspecified: Secondary | ICD-10-CM | POA: Diagnosis not present

## 2024-03-07 DIAGNOSIS — M7989 Other specified soft tissue disorders: Secondary | ICD-10-CM | POA: Diagnosis not present

## 2024-03-07 DIAGNOSIS — Z79899 Other long term (current) drug therapy: Secondary | ICD-10-CM | POA: Diagnosis not present

## 2024-03-07 DIAGNOSIS — I998 Other disorder of circulatory system: Secondary | ICD-10-CM | POA: Diagnosis not present

## 2024-03-07 DIAGNOSIS — Z716 Tobacco abuse counseling: Secondary | ICD-10-CM | POA: Diagnosis not present

## 2024-03-07 DIAGNOSIS — L819 Disorder of pigmentation, unspecified: Secondary | ICD-10-CM

## 2024-03-07 LAB — COMPREHENSIVE METABOLIC PANEL WITH GFR
ALT: 11 U/L (ref 0–44)
AST: 19 U/L (ref 15–41)
Albumin: 4.1 g/dL (ref 3.5–5.0)
Alkaline Phosphatase: 64 U/L (ref 38–126)
Anion gap: 11 (ref 5–15)
BUN: 29 mg/dL — ABNORMAL HIGH (ref 8–23)
CO2: 23 mmol/L (ref 22–32)
Calcium: 9.7 mg/dL (ref 8.9–10.3)
Chloride: 105 mmol/L (ref 98–111)
Creatinine, Ser: 0.71 mg/dL (ref 0.44–1.00)
GFR, Estimated: >60 mL/min (ref >60)
Glucose, Bld: 81 mg/dL (ref 70–99)
Potassium: 3.5 mmol/L (ref 3.5–5.1)
Sodium: 139 mmol/L (ref 135–145)
Total Bilirubin: 0.3 mg/dL (ref 0.0–1.2)
Total Protein: 7 g/dL (ref 6.5–8.1)

## 2024-03-07 LAB — CBC WITH DIFFERENTIAL/PLATELET
Abs Immature Granulocytes: 0.02 10*3/uL (ref 0.00–0.07)
Basophils Absolute: 0 10*3/uL (ref 0.0–0.1)
Basophils Relative: 1 %
Eosinophils Absolute: 0.1 10*3/uL (ref 0.0–0.5)
Eosinophils Relative: 2 %
HCT: 35.7 % — ABNORMAL LOW (ref 36.0–46.0)
Hemoglobin: 11.7 g/dL — ABNORMAL LOW (ref 12.0–15.0)
Immature Granulocytes: 0 %
Lymphocytes Relative: 15 %
Lymphs Abs: 1 10*3/uL (ref 0.7–4.0)
MCH: 30.6 pg (ref 26.0–34.0)
MCHC: 32.8 g/dL (ref 30.0–36.0)
MCV: 93.5 fL (ref 80.0–100.0)
Monocytes Absolute: 0.4 10*3/uL (ref 0.1–1.0)
Monocytes Relative: 6 %
Neutro Abs: 4.9 10*3/uL (ref 1.7–7.7)
Neutrophils Relative %: 76 %
Platelets: 241 10*3/uL (ref 150–400)
RBC: 3.82 MIL/uL — ABNORMAL LOW (ref 3.87–5.11)
RDW: 14.6 % (ref 11.5–15.5)
WBC: 6.5 10*3/uL (ref 4.0–10.5)
nRBC: 0 % (ref 0.0–0.2)

## 2024-03-07 LAB — LACTIC ACID, PLASMA
Lactic Acid, Venous: 0.7 mmol/L (ref 0.5–1.9)
Lactic Acid, Venous: 0.9 mmol/L (ref 0.5–1.9)

## 2024-03-07 LAB — PROTIME-INR
INR: 1.2 (ref 0.8–1.2)
Prothrombin Time: 15.4 s — ABNORMAL HIGH (ref 11.4–15.2)

## 2024-03-07 LAB — SEDIMENTATION RATE: Sed Rate: 27 mm/h — ABNORMAL HIGH (ref 0–22)

## 2024-03-07 LAB — C-REACTIVE PROTEIN: CRP: 0.9 mg/dL (ref <1.0)

## 2024-03-07 LAB — APTT: aPTT: 27 s (ref 24–36)

## 2024-03-07 MED ORDER — OXYCODONE HCL 5 MG PO TABS
5.0000 mg | ORAL_TABLET | ORAL | Status: DC | PRN
  Administered 2024-03-08 – 2024-03-09 (×6): 5 mg via ORAL
  Filled 2024-03-07 (×6): qty 1

## 2024-03-07 MED ORDER — ONDANSETRON HCL 4 MG/2ML IJ SOLN
4.0000 mg | Freq: Once | INTRAMUSCULAR | Status: AC
  Administered 2024-03-07: 4 mg via INTRAVENOUS
  Filled 2024-03-07: qty 2

## 2024-03-07 MED ORDER — POLYETHYLENE GLYCOL 3350 17 G PO PACK: 17.0000 g | PACK | Freq: Every day | ORAL | Status: DC | PRN

## 2024-03-07 MED ORDER — IOHEXOL 350 MG/ML SOLN
75.0000 mL | Freq: Once | INTRAVENOUS | Status: AC | PRN
  Administered 2024-03-07: 75 mL via INTRAVENOUS

## 2024-03-07 MED ORDER — ATORVASTATIN CALCIUM 40 MG PO TABS
40.0000 mg | ORAL_TABLET | Freq: Every day | ORAL | Status: DC
  Administered 2024-03-08 – 2024-03-12 (×4): 40 mg via ORAL
  Filled 2024-03-07 (×4): qty 1

## 2024-03-07 MED ORDER — MELATONIN 3 MG PO TABS
6.0000 mg | ORAL_TABLET | Freq: Every evening | ORAL | Status: DC | PRN
  Filled 2024-03-07: qty 2

## 2024-03-07 MED ORDER — ENOXAPARIN SODIUM 40 MG/0.4ML IJ SOSY
40.0000 mg | PREFILLED_SYRINGE | INTRAMUSCULAR | Status: DC
  Administered 2024-03-07 – 2024-03-09 (×3): 40 mg via SUBCUTANEOUS
  Filled 2024-03-07 (×3): qty 0.4

## 2024-03-07 MED ORDER — HYDROMORPHONE HCL 1 MG/ML IJ SOLN
1.0000 mg | Freq: Once | INTRAMUSCULAR | Status: AC
  Administered 2024-03-07: 1 mg via INTRAVENOUS
  Filled 2024-03-07: qty 1

## 2024-03-07 MED ORDER — NIFEDIPINE 10 MG PO CAPS
10.0000 mg | ORAL_CAPSULE | Freq: Three times a day (TID) | ORAL | Status: DC
  Administered 2024-03-07: 10 mg via ORAL
  Filled 2024-03-07 (×3): qty 1

## 2024-03-07 MED ORDER — ACETAMINOPHEN 500 MG PO TABS
1000.0000 mg | ORAL_TABLET | Freq: Four times a day (QID) | ORAL | Status: DC | PRN
  Administered 2024-03-09 – 2024-03-11 (×2): 1000 mg via ORAL
  Filled 2024-03-07 (×2): qty 2

## 2024-03-07 MED ORDER — CITALOPRAM HYDROBROMIDE 20 MG PO TABS: 20.0000 mg | ORAL_TABLET | Freq: Every day | ORAL | Status: DC

## 2024-03-07 MED ORDER — ALBUTEROL SULFATE (2.5 MG/3ML) 0.083% IN NEBU: 2.5000 mg | INHALATION_SOLUTION | RESPIRATORY_TRACT | Status: DC | PRN

## 2024-03-07 MED ORDER — NITROGLYCERIN 2 % TD OINT
0.7500 [in_us] | TOPICAL_OINTMENT | Freq: Four times a day (QID) | TRANSDERMAL | Status: DC
  Administered 2024-03-07 – 2024-03-12 (×17): 0.75 [in_us] via TOPICAL
  Filled 2024-03-07 (×2): qty 30

## 2024-03-07 MED ORDER — OXYCODONE HCL 5 MG PO TABS
2.5000 mg | ORAL_TABLET | ORAL | Status: DC | PRN
  Administered 2024-03-07: 2.5 mg via ORAL
  Filled 2024-03-07: qty 1

## 2024-03-07 MED ORDER — ONDANSETRON HCL 4 MG/2ML IJ SOLN
4.0000 mg | Freq: Four times a day (QID) | INTRAMUSCULAR | Status: DC | PRN
  Administered 2024-03-08: 4 mg via INTRAVENOUS
  Filled 2024-03-07: qty 2

## 2024-03-07 MED ORDER — IRBESARTAN 150 MG PO TABS: 150.0000 mg | ORAL_TABLET | Freq: Every day | ORAL | Status: DC

## 2024-03-07 MED ORDER — HYDROMORPHONE HCL 1 MG/ML IJ SOLN
0.5000 mg | INTRAMUSCULAR | Status: DC | PRN
  Administered 2024-03-08 – 2024-03-12 (×13): 0.5 mg via INTRAVENOUS
  Filled 2024-03-07 (×13): qty 0.5

## 2024-03-07 NOTE — ED Triage Notes (Signed)
 She reports acute cyanosis of right index finger with edema of same. She tells [place] that she "noticed a couple of paper cuts on my finger 4 or 5 days ago". She denies fever, and is in no distress.

## 2024-03-07 NOTE — ED Provider Notes (Signed)
 [place] EMERGENCY DEPARTMENT AT [facility] Provider Note   CSN: [id number] Arrival date & time: 03/07/24  1157     History  Chief Complaint  Patient presents with   Hand Pain    Kristina Howe is a 63 y.o. female.  The history is provided by the patient and medical records. No language interpreter was used.  Hand Pain This is a new problem. The current episode started more than 2 days ago. The problem occurs constantly. The problem has been gradually worsening. Pertinent negatives include no chest pain, no abdominal pain, no headaches and no shortness of breath. Nothing aggravates the symptoms. Nothing relieves the symptoms. She has tried nothing for the symptoms. The treatment provided no relief.       Home Medications Prior to Admission medications   Medication Sig Start Date End Date Taking? Authorizing Provider  omeprazole (PRILOSEC) 20 MG capsule Take 1 capsule (20 mg total) by mouth daily. 04/12/22 05/12/22  Smoot, Shawn Route, PA-C  pravastatin (PRAVACHOL) 20 MG tablet Take 20 mg by mouth daily.      [provider]  triamterene-hydrochlorothiazide (MAXZIDE-25) 37.5-25 MG per tablet Take 1 tablet by mouth daily.      [provider]      Allergies    Patient has no known allergies.    Review of Systems   Review of Systems  Constitutional:  Negative for chills, fatigue and fever.  HENT:  Negative for congestion.   Respiratory:  Negative for cough, chest tightness, shortness of breath and wheezing.   Cardiovascular:  Negative for chest pain.  Gastrointestinal:  Negative for abdominal pain, constipation, diarrhea, nausea and vomiting.  Genitourinary:  Negative for dysuria, flank pain and frequency.  Musculoskeletal:  Negative for back pain, neck pain and neck stiffness.  Skin:  Positive for color change. Negative for wound.  Neurological:  Positive for numbness. Negative for weakness and headaches.  Psychiatric/Behavioral:  Negative for  agitation.   All other systems reviewed and are negative.   Physical Exam Updated Vital Signs BP (!) 147/85 (BP Location: Right Arm)   Pulse 73   Temp 97.8 F (36.6 C)   Resp 18   Ht 5\' 9"  (1.753 m)   Wt 49 kg   SpO2 98%   BMI 15.95 kg/m  Physical Exam Vitals and nursing note reviewed.  Constitutional:      General: She is not in acute distress.    Appearance: She is well-developed. She is not ill-appearing, toxic-appearing or diaphoretic.  HENT:     Head: Normocephalic and atraumatic.     Nose: No congestion or rhinorrhea.     Mouth/Throat:     Pharynx: No oropharyngeal exudate or posterior oropharyngeal erythema.  Eyes:     Extraocular Movements: Extraocular movements intact.     Conjunctiva/sclera: Conjunctivae normal.     Pupils: Pupils are equal, round, and reactive to light.  Cardiovascular:     Rate and Rhythm: Normal rate and regular rhythm.     Heart sounds: No murmur heard. Pulmonary:     Effort: Pulmonary effort is normal. No respiratory distress.     Breath sounds: Normal breath sounds.  Abdominal:     Palpations: Abdomen is soft.     Tenderness: There is no abdominal tenderness.  Musculoskeletal:        General: No swelling.     Cervical back: Neck supple.  Skin:    General: Skin is cool.     Capillary Refill: Capillary refill  takes less than 2 seconds.     Coloration: Skin is cyanotic.     Findings: No laceration.     Comments: Patient has cyanosis and dusky appearance of index finger, middle finger, and to a small degree the right hand has ring finger.  Increased capillary refill in all fingers on the right hand and absent capillary refill in the index and middle finger.  She did have palpable radial and ulnar pulse on my exam in the right arm.  Her right hand was slightly cooler to the touch than the left.  She is right-handed.  Neurological:     General: No focal deficit present.     Mental Status: She is alert.  Psychiatric:        Mood and  Affect: Mood normal.            ED Results / Procedures / Treatments   Labs (all labs ordered are listed, but only abnormal results are displayed) Labs Reviewed  CBC WITH DIFFERENTIAL/PLATELET - Abnormal; Notable for the following components:      Result Value   RBC 3.82 (*)    Hemoglobin 11.7 (*)    HCT 35.7 (*)    All other components within normal limits  COMPREHENSIVE METABOLIC PANEL WITH GFR - Abnormal; Notable for the following components:   BUN 29 (*)    All other components within normal limits  CULTURE, BLOOD (ROUTINE X 2)  CULTURE, BLOOD (ROUTINE X 2)  LACTIC ACID, PLASMA  LACTIC ACID, PLASMA    EKG EKG Interpretation Date/Time:  Sunday March 07 2024 15:34:26 EDT Ventricular Rate:  54 PR Interval:  171 QRS Duration:  91 QT Interval:  420 QTC Calculation: 398 R Axis:   -25  Text Interpretation: Sinus rhythm Probable left atrial enlargement Borderline left axis deviation Anteroseptal infarct, age indeterminate ST elevation, consider inferior injury no prior ECG for comparison No STEMI Confirmed by Theda Belfast ([id number]) on 03/07/2024 3:48:55 PM  Radiology No results found.  Procedures Procedures    Medications Ordered in ED Medications  HYDROmorphone (DILAUDID) injection 1 mg (1 mg Intravenous Given 03/07/24 1447)  iohexol (OMNIPAQUE) 350 MG/ML injection 75 mL (75 mLs Intravenous Contrast Given 03/07/24 1517)    ED Course/ Medical Decision Making/ A&P                                 Medical Decision Making   Kristina Howe is a 63 y.o. female with a past medical history significant for hypertension hypercholesterolemia who presents with right hand problem.  According to patient, for the last week or so she has had pain developing in her right hand specifically in her index middle and ring fingers.  She also has some pain in her thumb but is not discolored like the other fingers have been changing.  She reports the last few days they have turned blue  and dark and are dusky.  She reports no history of blood clots and does have a family history of Raynaud's but is never had that in the past herself.  She is a smoker but has cut back on her smoking down to 10 cigarettes a day she reports.  She denies any fevers or chills but does think that her hand is cool to the touch compared to the left side.  No history of other clotting issues.  No history of arm trauma.  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  Denies any new lacerations but says she may have had a small paper cut on her index finger recently.  Is not open now.  Denies any other fevers, chills, congestion, cough, nausea, vomiting, constipation, diarrhea, or urinary changes.  On exam, lungs clear.  Chest nontender.  Abdomen nontender.  EKG shows no A-fib or arrhythmia.  No STEMI.  Her right arm had intact radial and ulnar pulse and she had intact sensation and strength in the arm and forearm and elbow.  Her right hand did have some tingling at the fingers but was very tender to the touch.  She could not move her finger in all directions with her index and middle finger.  There was no capillary refill and was quite dusky.  There was no crepitance or fluctuance appreciated initially and there was no laceration or drainage seen.  Grooves were cool to the touch compared to left side.  Otherwise exam unremarkable.  Clinically I am concerned about ischemia to the fingers.  We discussed possibly being Buerger's syndrome given her smoking history.  I called and spoke with vascular surgery who did recommend getting a CTA of the arm to look for obstruction.  Anticipate patient may end up needing admission for pain control although I am uncertain if there will be a blockage that vascular could intervene on or not.  Care be transferred oncoming team to wait for CT to return and then discussion with vascular surgery about disposition.  Care transferred in stable  condition.         Final Clinical Impression(s) / ED Diagnoses Final diagnoses:  Finger pain, right  Discoloration of skin of finger    Clinical Impression: 1. Finger pain, right   2. Discoloration of skin of finger     Disposition: Care transferred oncoming team to await CT results and reassessment and discussion with vascular surgery.  This note was prepared with assistance of Conservation officer, historic buildings. Occasional wrong-word or sound-a-like substitutions may have occurred due to the inherent limitations of voice recognition software.      Lyndall Bellot, Canary Brim, MD 03/07/24 [phone]

## 2024-03-07 NOTE — ED Notes (Signed)
 Called [facility] for transport, pt bed assignment is ready

## 2024-03-07 NOTE — ED Notes (Signed)
 Transported to ct

## 2024-03-07 NOTE — ED Provider Notes (Signed)
 Care was taken over from Dr. Stephanie Coup.  Patient has some ischemic fingers of her right hand that are painful.  She did get some pain relief with the pain medication that was administered.  CTA of the extremity does not show any obvious vascular abnormality.  I did reconsult Dr. Hetty Blend with vascular surgery who reviewed the images and feels that they look normal.  Discussed with him admission for echocardiogram to rule out an embolic etiology.  I spoke with Dr. Jerral Ralph with the hospitalist service he will admit the patient for further treatment.  Dr. Hetty Blend also recommended possibly starting some nifedipine for symptomatic control given this is possibly resulting from vasospasm.  Will give a dose of nifedipine now.   Rolan Bucco, MD 03/07/24 [phone]

## 2024-03-08 ENCOUNTER — Observation Stay (HOSPITAL_BASED_OUTPATIENT_CLINIC_OR_DEPARTMENT_OTHER)

## 2024-03-08 DIAGNOSIS — Z716 Tobacco abuse counseling: Secondary | ICD-10-CM | POA: Diagnosis not present

## 2024-03-08 DIAGNOSIS — I2699 Other pulmonary embolism without acute cor pulmonale: Secondary | ICD-10-CM | POA: Diagnosis not present

## 2024-03-08 DIAGNOSIS — R6252 Short stature (child): Secondary | ICD-10-CM | POA: Diagnosis not present

## 2024-03-08 DIAGNOSIS — R918 Other nonspecific abnormal finding of lung field: Secondary | ICD-10-CM | POA: Diagnosis not present

## 2024-03-08 DIAGNOSIS — I2510 Atherosclerotic heart disease of native coronary artery without angina pectoris: Secondary | ICD-10-CM | POA: Diagnosis not present

## 2024-03-08 DIAGNOSIS — I700 Atherosclerosis of aorta: Secondary | ICD-10-CM | POA: Diagnosis not present

## 2024-03-08 DIAGNOSIS — I70228 Atherosclerosis of native arteries of extremities with rest pain, other extremity: Secondary | ICD-10-CM | POA: Diagnosis not present

## 2024-03-08 DIAGNOSIS — I38 Endocarditis, valve unspecified: Secondary | ICD-10-CM | POA: Diagnosis not present

## 2024-03-08 DIAGNOSIS — R222 Localized swelling, mass and lump, trunk: Secondary | ICD-10-CM | POA: Diagnosis not present

## 2024-03-08 DIAGNOSIS — M79644 Pain in right finger(s): Secondary | ICD-10-CM | POA: Diagnosis not present

## 2024-03-08 DIAGNOSIS — E7800 Pure hypercholesterolemia, unspecified: Secondary | ICD-10-CM | POA: Diagnosis not present

## 2024-03-08 DIAGNOSIS — I708 Atherosclerosis of other arteries: Secondary | ICD-10-CM | POA: Diagnosis not present

## 2024-03-08 DIAGNOSIS — R590 Localized enlarged lymph nodes: Secondary | ICD-10-CM | POA: Diagnosis not present

## 2024-03-08 DIAGNOSIS — I10 Essential (primary) hypertension: Secondary | ICD-10-CM | POA: Diagnosis not present

## 2024-03-08 DIAGNOSIS — F419 Anxiety disorder, unspecified: Secondary | ICD-10-CM | POA: Diagnosis not present

## 2024-03-08 DIAGNOSIS — J439 Emphysema, unspecified: Secondary | ICD-10-CM | POA: Diagnosis not present

## 2024-03-08 DIAGNOSIS — I998 Other disorder of circulatory system: Secondary | ICD-10-CM | POA: Diagnosis not present

## 2024-03-08 DIAGNOSIS — Z79899 Other long term (current) drug therapy: Secondary | ICD-10-CM | POA: Diagnosis not present

## 2024-03-08 DIAGNOSIS — R531 Weakness: Secondary | ICD-10-CM | POA: Diagnosis not present

## 2024-03-08 DIAGNOSIS — D62 Acute posthemorrhagic anemia: Secondary | ICD-10-CM | POA: Diagnosis not present

## 2024-03-08 DIAGNOSIS — R202 Paresthesia of skin: Secondary | ICD-10-CM | POA: Diagnosis not present

## 2024-03-08 DIAGNOSIS — I771 Stricture of artery: Secondary | ICD-10-CM | POA: Diagnosis not present

## 2024-03-08 DIAGNOSIS — Z90710 Acquired absence of both cervix and uterus: Secondary | ICD-10-CM | POA: Diagnosis not present

## 2024-03-08 DIAGNOSIS — I748 Embolism and thrombosis of other arteries: Secondary | ICD-10-CM | POA: Diagnosis not present

## 2024-03-08 DIAGNOSIS — F17210 Nicotine dependence, cigarettes, uncomplicated: Secondary | ICD-10-CM | POA: Diagnosis not present

## 2024-03-08 DIAGNOSIS — R230 Cyanosis: Secondary | ICD-10-CM | POA: Diagnosis not present

## 2024-03-08 DIAGNOSIS — I6501 Occlusion and stenosis of right vertebral artery: Secondary | ICD-10-CM | POA: Diagnosis not present

## 2024-03-08 LAB — CBC
HCT: 34.8 % — ABNORMAL LOW (ref 36.0–46.0)
Hemoglobin: 11.3 g/dL — ABNORMAL LOW (ref 12.0–15.0)
MCH: 30.2 pg (ref 26.0–34.0)
MCHC: 32.5 g/dL (ref 30.0–36.0)
MCV: 93 fL (ref 80.0–100.0)
Platelets: 253 10*3/uL (ref 150–400)
RBC: 3.74 MIL/uL — ABNORMAL LOW (ref 3.87–5.11)
RDW: 14.5 % (ref 11.5–15.5)
WBC: 6.2 10*3/uL (ref 4.0–10.5)
nRBC: 0 % (ref 0.0–0.2)

## 2024-03-08 LAB — LIPID PANEL
Cholesterol: 181 mg/dL (ref 0–200)
HDL: 46 mg/dL (ref >40)
LDL Cholesterol: 105 mg/dL — ABNORMAL HIGH (ref 0–99)
Total CHOL/HDL Ratio: 3.9 ratio
Triglycerides: 151 mg/dL — ABNORMAL HIGH (ref <150)
VLDL: 30 mg/dL (ref 0–40)

## 2024-03-08 LAB — BASIC METABOLIC PANEL WITH GFR
Anion gap: 13 (ref 5–15)
BUN: 21 mg/dL (ref 8–23)
CO2: 22 mmol/L (ref 22–32)
Calcium: 9.7 mg/dL (ref 8.9–10.3)
Chloride: 102 mmol/L (ref 98–111)
Creatinine, Ser: 0.77 mg/dL (ref 0.44–1.00)
GFR, Estimated: >60 mL/min (ref >60)
Glucose, Bld: 79 mg/dL (ref 70–99)
Potassium: 3.6 mmol/L (ref 3.5–5.1)
Sodium: 137 mmol/L (ref 135–145)

## 2024-03-08 LAB — RAPID URINE DRUG SCREEN, HOSP PERFORMED
Amphetamines: NOT DETECTED
Barbiturates: NOT DETECTED
Benzodiazepines: NOT DETECTED
Cocaine: NOT DETECTED
Opiates: POSITIVE — AB
Tetrahydrocannabinol: POSITIVE — AB

## 2024-03-08 LAB — HEMOGLOBIN A1C
Hgb A1c MFr Bld: 6.1 % — ABNORMAL HIGH (ref 4.8–5.6)
Mean Plasma Glucose: 128.37 mg/dL

## 2024-03-08 LAB — ECHOCARDIOGRAM COMPLETE
Area-P 1/2: 3.17 cm2
Height: 69 in
S' Lateral: 3.1 cm
Weight: 1728 [oz_av]

## 2024-03-08 LAB — PHOSPHORUS: Phosphorus: 2.6 mg/dL (ref 2.5–4.6)

## 2024-03-08 LAB — MAGNESIUM: Magnesium: 1.9 mg/dL (ref 1.7–2.4)

## 2024-03-08 LAB — HIV ANTIBODY (ROUTINE TESTING W REFLEX): HIV Screen 4th Generation wRfx: NONREACTIVE

## 2024-03-08 MED ORDER — NIFEDIPINE ER OSMOTIC RELEASE 30 MG PO TB24
30.0000 mg | ORAL_TABLET | Freq: Every day | ORAL | Status: DC
  Administered 2024-03-08 – 2024-03-12 (×4): 30 mg via ORAL
  Filled 2024-03-08 (×5): qty 1

## 2024-03-08 MED ORDER — ALPRAZOLAM 0.5 MG PO TABS: 0.5000 mg | ORAL_TABLET | Freq: Every evening | ORAL | Status: DC | PRN

## 2024-03-08 NOTE — Progress Notes (Signed)
 PROGRESS NOTE  Kristina Howe ZOX:[id number] DOB: 11-Dec-1960 DOA: 03/07/2024 PCP: Blair Heys, MD (Inactive)  HPI/Recap of past 24 hours: Kristina Howe is a 63 y.o. female with hx of HTN, HLD, anxiety, current smoker, who was transferred from [facility] ED for digital ischemia X 5 days. Pt noted aching in her right 2nd through 4th fingers and then the fingers became progressively blue discolored and cold.  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  She denies any numbness in the affected fingers.  Does have weakness with flexion especially at the right second digit/index finger.  No history of similar episodes in the past.  No other affected limbs.  No prior history of clots or clots in the family.  Denies any history of Raynaud's. No known history of autoimmune disease.  No IV drug use.     Today, patient reports some slight improvement, right index finger still cold/blue, unable to flex with intermittent significant pain.  Denies any other new complaints.  Once again advised to quit smoking cigarettes, verbalized understanding, reports no cravings.    Assessment/Plan: Principal Problem:   Ischemic finger   Digital ischemia of right 2nd through 4th digits Improving, still persistent on the right index finger, denies any sensory loss Unclear etiology at this time, possibly Buerger's disease/Raynaud's, versus cardioembolic/hypercoagulability, history of smoking cigarettes Acute onset with pain followed by cyanotic changes since 3/26 sensory loss CTA with patent vasculature through the pulm artery branches, digital branches not well-visualized possibly technical Lab evaluation including ESR, CRP, ANA, C3, C4, cryoglobulins, SCL 70, anticentromere, coags, lupus anticoagulant panel, tox screen Echo with no evidence of thrombus or valvular vegetations, EF 60 to 65%, no regional wall motion abnormality, left  ventricular diastolic parameters were normal EDP and admitting physician discussed with vascular surgery Dr. Hetty Blend recommending for CTA reported as above and starting on nifedipine, no planned intervention from vascular surgery, suspect vasospastic cause of her symptoms. Does not recommend for empiric anticoagulation unless embolic source is identified and no plan for angiography Continue nifedipine, Nitroglycerin paste 0.25 inches on the right second, third, fourth digit Heat to the right hand Smoking cessation per below  Hypertension BP soft Continue only nifedipine for now, continue to hold triamterene-hydrochlorothiazide, valsartan   Hyperlipidemia LDL 105 Continue statins   Incidental findings on imaging Lung nodules right upper lobe up to 7 mm (imaged on CTA of right upper extremity) Recommend outpatient CT chest in the short term to evaluate for additional lung nodule and follow-up CT depending on findings on the initial study in 3-6 mo for the RUL lesions PCP to follow-up   Emphysema by imaging Denies any shortness of breath, on room air PCP follow-up Advised to quit tobacco   Anxiety Xanax nightly as needed  Smoking cessation Current smoker 1/2 pack/day, advised to quit, verbalized understanding Hold off on nicotine replacement if possible Would offer for bupropion if she is interested       Estimated body mass index is 15.95 kg/m as calculated from the following:   Height as of this encounter: 5\' 9"  (1.753 m).   Weight as of this encounter: 49 kg.     Code Status: Full  Family Communication: None at bedside  Disposition Plan: Status is: Observation The patient will require care spanning > 2 midnights and should be moved to inpatient because: Level of care      Consultants:  None  Procedures: None  Antimicrobials: None  DVT prophylaxis: Lovenox   Objective: Vitals:   03/08/24 0135 03/08/24 0211 03/08/24 0428 03/08/24 0749  BP: 107/65  105/69 108/65 97/69  Pulse: 77  61 72  Resp:   16 17  Temp:   98.4 F (36.9 C) 98.7 F (37.1 C)  TempSrc:    Oral  SpO2: 96%  96% 96%  Weight:      Height:        Intake/Output Summary (Last 24 hours) at 03/08/2024 1458 Last data filed at 03/08/2024 0900 Gross per 24 hour  Intake 240 ml  Output --  Net 240 ml   Filed Weights   03/07/24 1206  Weight: 49 kg    Exam: General: NAD  Cardiovascular: S1, S2 present Respiratory: CTAB Abdomen: Soft, nontender, nondistended, bowel sounds present Musculoskeletal: No bilateral pedal edema noted, noted right index finger cool, blue/dusky, sensation intact Skin: Noted as above Psychiatry: Normal mood     Data Reviewed: CBC: Recent Labs  Lab 03/07/24 1434 03/08/24 0754  WBC 6.5 6.2  NEUTROABS 4.9  --   HGB 11.7* 11.3*  HCT 35.7* 34.8*  MCV 93.5 93.0  PLT 241 253   Basic Metabolic Panel: Recent Labs  Lab 03/07/24 1434 03/08/24 0754  NA 139 137  K 3.5 3.6  CL 105 102  CO2 23 22  GLUCOSE 81 79  BUN 29* 21  CREATININE 0.71 0.77  CALCIUM 9.7 9.7  MG  --  1.9  PHOS  --  2.6   GFR: Estimated Creatinine Clearance: 57.1 mL/min (by C-G formula based on SCr of 0.77 mg/dL). Liver Function Tests: Recent Labs  Lab 03/07/24 1434  AST 19  ALT 11  ALKPHOS 64  BILITOT 0.3  PROT 7.0  ALBUMIN 4.1   No results for input(s): "LIPASE", "AMYLASE" in the last 168 hours. No results for input(s): "AMMONIA" in the last 168 hours. Coagulation Profile: Recent Labs  Lab 03/07/24 2153  INR 1.2   Cardiac Enzymes: No results for input(s): "CKTOTAL", "CKMB", "CKMBINDEX", "TROPONINI" in the last 168 hours. BNP (last 3 results) No results for input(s): "PROBNP" in the last 8760 hours. HbA1C: Recent Labs    03/08/24 0754  HGBA1C 6.1*   CBG: No results for input(s): "GLUCAP" in the last 168 hours. Lipid Profile: Recent Labs    03/08/24 0754  CHOL 181  HDL 46  LDLCALC 105*  TRIG 151*  CHOLHDL 3.9   Thyroid Function  Tests: No results for input(s): "TSH", "T4TOTAL", "FREET4", "T3FREE", "THYROIDAB" in the last 72 hours. Anemia Panel: No results for input(s): "VITAMINB12", "FOLATE", "FERRITIN", "TIBC", "IRON", "RETICCTPCT" in the last 72 hours. Urine analysis:    Component Value Date/Time   COLORURINE YELLOW 04/12/2022 1117   APPEARANCEUR CLEAR 04/12/2022 1117   LABSPEC 1.011 04/12/2022 1117   PHURINE 7.0 04/12/2022 1117   GLUCOSEU NEGATIVE 04/12/2022 1117   HGBUR NEGATIVE 04/12/2022 1117   BILIRUBINUR NEGATIVE 04/12/2022 1117   KETONESUR NEGATIVE 04/12/2022 1117   PROTEINUR NEGATIVE 04/12/2022 1117   NITRITE NEGATIVE 04/12/2022 1117   LEUKOCYTESUR NEGATIVE 04/12/2022 1117   Sepsis Labs: @LABRCNTIP (procalcitonin:4,lacticidven:4)  ) Recent Results (from the past 240 hours)  Blood culture (routine x 2)     Status: None (Preliminary result)   Collection Time: 03/07/24  2:33 PM   Specimen: BLOOD  Result Value Ref Range Status   Specimen Description   Final    BLOOD BLOOD LEFT FOREARM Performed at [facility], [address],  [place], [place] [address]    Special Requests   Final    BOTTLES DRAWN AEROBIC AND ANAEROBIC Blood Culture adequate volume Performed at [facility], [address], [place], [place] [address]    Culture   Final    NO GROWTH < 24 HOURS Performed at [facility], [address], [place], [place] [address]    Report Status PENDING  Incomplete  Blood culture (routine x 2)     Status: None (Preliminary result)   Collection Time: 03/07/24  2:46 PM   Specimen: BLOOD  Result Value Ref Range Status   Specimen Description   Final    BLOOD RIGHT ANTECUBITAL Performed at [facility], [address] [place], [place] [address]    Special Requests   Final    BOTTLES DRAWN AEROBIC AND ANAEROBIC Blood Culture adequate volume Performed at [facility], [address], [place], [place]  [address]    Culture   Final    NO GROWTH < 24 HOURS Performed at [facility], [address], [place], [place] [address]    Report Status PENDING  Incomplete      Studies: ECHOCARDIOGRAM COMPLETE Result Date: 03/08/2024    ECHOCARDIOGRAM REPORT   Patient Name:   Kristina Howe Date of Exam: 03/08/2024 Medical Rec #:  [id number]      Height:       69.0 in Accession #:    [id number]     Weight:       108.0 lb Date of Birth:  07-19-1961      BSA:          1.590 m Patient Age:    62 years       BP:           108/65 mmHg Patient Gender: F              HR:           55 bpm. Exam Location:  Inpatient Procedure: 2D Echo, Color Doppler and Cardiac Doppler (Both Spectral and Color            Flow Doppler were utilized during procedure). Indications:    Endocarditis I38  History:        Patient has no prior history of Echocardiogram examinations.  Sonographer:    Harriette Bouillon RDCS Referring Phys: [id number] Dolly Rias IMPRESSIONS  1. Left ventricular ejection fraction, by estimation, is 60 to 65%. The left ventricle has normal function. The left ventricle has no regional wall motion abnormalities. Left ventricular diastolic parameters were normal.  2. Right ventricular systolic function is normal. The right ventricular size is normal. There is normal pulmonary artery systolic pressure.  3. The mitral valve is normal in structure. No evidence of mitral valve regurgitation. No evidence of mitral stenosis.  4. The aortic valve is normal in structure. Aortic valve regurgitation is not visualized. No aortic stenosis is present.  5. The inferior vena cava is normal in size with greater than 50% respiratory variability, suggesting right atrial pressure of 3 mmHg. Conclusion(s)/Recommendation(s): No evidence of valvular vegetations on this transthoracic echocardiogram. Consider a transesophageal echocardiogram to exclude infective endocarditis if clinically indicated. FINDINGS  Left Ventricle: Left ventricular  ejection fraction, by estimation, is 60 to 65%. The left ventricle has normal function. The left ventricle has no regional wall motion abnormalities. The left ventricular internal cavity size was normal in size. There is  no left ventricular hypertrophy. Left ventricular diastolic parameters  were normal. Right Ventricle: The right ventricular size is normal. No increase in right ventricular wall thickness. Right ventricular systolic function is normal. There is normal pulmonary artery systolic pressure. The tricuspid regurgitant velocity is 2.30 m/s, and  with an assumed right atrial pressure of 3 mmHg, the estimated right ventricular systolic pressure is 24.2 mmHg. Left Atrium: Left atrial size was normal in size. Right Atrium: Right atrial size was normal in size. Pericardium: There is no evidence of pericardial effusion. Mitral Valve: The mitral valve is normal in structure. No evidence of mitral valve regurgitation. No evidence of mitral valve stenosis. Tricuspid Valve: The tricuspid valve is normal in structure. Tricuspid valve regurgitation is trivial. No evidence of tricuspid stenosis. Aortic Valve: The aortic valve is normal in structure. Aortic valve regurgitation is not visualized. No aortic stenosis is present. Pulmonic Valve: The pulmonic valve was normal in structure. Pulmonic valve regurgitation is not visualized. No evidence of pulmonic stenosis. Aorta: The aortic root is normal in size and structure. Venous: The inferior vena cava is normal in size with greater than 50% respiratory variability, suggesting right atrial pressure of 3 mmHg. IAS/Shunts: No atrial level shunt detected by color flow Doppler.  LEFT VENTRICLE PLAX 2D LVIDd:         4.20 cm   Diastology LVIDs:         3.10 cm   LV e' medial:    7.62 cm/s LV PW:         1.00 cm   LV E/e' medial:  13.1 LV IVS:        1.10 cm   LV e' lateral:   10.60 cm/s LVOT diam:     1.90 cm   LV E/e' lateral: 9.4 LV SV:         76 LV SV Index:   48 LVOT  Area:     2.84 cm  RIGHT VENTRICLE             IVC RV S prime:     11.40 cm/s  IVC diam: 1.40 cm TAPSE (M-mode): 2.4 cm LEFT ATRIUM             Index        RIGHT ATRIUM           Index LA diam:        3.00 cm 1.89 cm/m   RA Area:     11.50 cm LA Vol (A2C):   24.9 ml 15.66 ml/m  RA Volume:   22.80 ml  14.34 ml/m LA Vol (A4C):   38.9 ml 24.47 ml/m LA Biplane Vol: 32.9 ml 20.69 ml/m  AORTIC VALVE LVOT Vmax:   117.00 cm/s LVOT Vmean:  77.000 cm/s LVOT VTI:    0.267 m  AORTA Ao Root diam: 2.70 cm Ao Asc diam:  3.00 cm MITRAL VALVE                TRICUSPID VALVE MV Area (PHT): 3.17 cm     TR Peak grad:   21.2 mmHg MV Decel Time: 239 msec     TR Vmax:        230.00 cm/s MV E velocity: 100.00 cm/s MV A velocity: 77.60 cm/s   SHUNTS MV E/A ratio:  1.29         Systemic VTI:  0.27 m                             Systemic  Diam: 1.90 cm Thurmon Fair MD Electronically signed by Thurmon Fair MD Signature Date/Time: 03/08/2024/2:17:42 PM    Final    CT ANGIO UP EXTREM RIGHT W &/OR WO CONTRAST Result Date: 03/07/2024 CLINICAL DATA:  Acute onset right index finger discoloration and swelling. EXAM: CT ANGIOGRAPHY OF THE RIGHT UPPEREXTREMITY TECHNIQUE: Multidetector CT imaging of the right upperwas performed using the standard protocol during bolus administration of intravenous contrast. Multiplanar CT image reconstructions and MIPs were obtained to evaluate the vascular anatomy. RADIATION DOSE REDUCTION: This exam was performed according to the departmental dose-optimization program which includes automated exposure control, adjustment of the mA and/or kV according to patient size and/or use of iterative reconstruction technique. CONTRAST:  75mL OMNIPAQUE IOHEXOL 350 MG/ML SOLN COMPARISON:  None Available. FINDINGS: Vascular The right subclavian, axillary, brachial, radial, and ulnar arteries are unremarkable. The deep and superficial palmar arches are unremarkable. The digital arteries are poorly evaluated.  Bones/Joint/Cartilage No fracture or dislocation. Degenerative changes of the radiocarpal joint. No joint effusion. Ligaments Ligaments are suboptimally evaluated by CT. Muscles and Tendons Grossly intact. Soft tissue No fluid collection or subcutaneous emphysema.  No soft tissue mass. Mild lobular simple emphysema. Multiple nodules in the right upper lobe measuring up to 7 mm (series 4, image 76). Review of the MIP images confirms the above findings. IMPRESSION: 1. No acute vascular abnormality. Poor evaluation of the digital arteries is likely technical. Consider dedicated upper extremity angiogram for better evaluation as clinically indicated. 2. Multiple nodules in the right upper lobe measuring up to 7 mm. Non-contrast chest CT at 3-6 months is recommended. 3.  Emphysema (ICD10-J43.9). Electronically Signed   By: Obie Dredge M.D.   On: 03/07/2024 15:53    Scheduled Meds:  atorvastatin  40 mg Oral Daily   enoxaparin (LOVENOX) injection  40 mg Subcutaneous Q24H   NIFEdipine  30 mg Oral Daily   nitroGLYCERIN  0.75 inch Topical Q6H    Continuous Infusions:   LOS: 0 days     Briant Cedar, MD [facility]  If 7PM-7AM, please contact night-coverage [email or web address] 03/08/2024, 2:58 PM

## 2024-03-08 NOTE — H&P (Addendum)
 History and Physical    Kristina Howe DOB: 10-16-1961 DOA: 03/07/2024  PCP: Blair Heys, MD (Inactive)   Patient coming from: Home   Chief Complaint:  Chief Complaint  Patient presents with   Hand Pain    HPI:  Kristina Howe is a 63 y.o. female with hx of HTN, HLD, anxiety, current smoker, who was transferred from [facility] ED for digital ischemia.  She reports that on Wednesday she began to have aching in her right 2nd through 4th fingers and then the fingers became progressively blue discolored and cold.  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  She denies any numbness in the affected fingers.  Does have weakness with flexion especially at the right second digit.  No history of similar episodes in the past.  No other affected limbs.  No prior history of clots or clots in the family.  Denies any history of Raynaud's. No known history of autoimmune disease.  No IV drug use.    Review of Systems:  ROS complete and negative except as marked above   No Known Allergies  Prior to Admission medications   Medication Sig Start Date End Date Taking? Authorizing Provider  atorvastatin (LIPITOR) 40 MG tablet Take 40 mg by mouth daily. 02/15/24  Yes [provider]  tiZANidine (ZANAFLEX) 2 MG tablet Take 2 mg by mouth every 6 (six) hours as needed. 02/20/24  Yes [provider]  triamterene-hydrochlorothiazide (DYAZIDE) 37.5-25 MG capsule Take 1 capsule by mouth daily. 06/09/17  Yes [provider]  valsartan (DIOVAN) 320 MG tablet Take 320 mg by mouth daily. 12/31/23  Yes [provider]  citalopram (CELEXA) 20 MG tablet Take 20 mg by mouth daily.    [provider]  omeprazole (PRILOSEC) 20 MG capsule Take 1 capsule (20 mg total) by mouth daily. 04/12/22 05/12/22  Smoot, Shawn Route, PA-C  pravastatin (PRAVACHOL) 20 MG tablet Take 20 mg by mouth daily.       [provider]  triamterene-hydrochlorothiazide (MAXZIDE-25) 37.5-25 MG per tablet Take 1 tablet by mouth daily.      [provider]    Past Medical History:  Diagnosis Date   Elevated cholesterol    Hypertension     Past Surgical History:  Procedure Laterality Date   ABDOMINAL HYSTERECTOMY       reports that she has been smoking. She does not have any smokeless tobacco history on file. She reports current alcohol use. She reports that she does not use drugs.  History reviewed. No pertinent family history.   Physical Exam: Vitals:   03/07/24 1600 03/07/24 1800 03/07/24 2009 03/07/24 2134  BP: 135/60 (!) 156/75 124/69 128/62  Pulse: (!) 55 (!) 59 (!) 51 66  Resp: 15 16 16    Temp: 97.7 F (36.5 C) 97.9 F (36.6 C) (!) 97.4 F (36.3 C)   TempSrc: Oral Oral    SpO2: 99% 98% 99% 100%  Weight:      Height:        Gen: Awake, alert, NAD   CV: Regular, normal S1, S2, no murmurs, radial and ulnar pulses are 2+ by early. Resp: Normal WOB, slightly decreased air movement, rales in the right base which clear with deep breathing. Abd: Flat, normoactive, nontender MSK: Symmetric, no edema  Skin: The right 2nd through 4th digits have cyanotic changes distally, worst involving the right second  finger extending cyanotic changes from PIP distally.  Cool to the touch with decreased capillary refill.  There are no splinter hemorrhages or other lesions on the hands.  No other affected limbs.  No other rashes or lesions.  No tightening of the skin of the hands. Neuro: Alert and interactive  Psych: Anxious but appropriate    Data review:   Labs reviewed, notable for:   Lactate 0.9 Unremarkable chemistries and blood counts   Micro:  Results for orders placed or performed in visit on 10/20/19  Novel Coronavirus, NAA ([facility])     Status: None   Collection Time: 10/20/19 12:00 AM   Specimen: Nasopharyngeal(NP) swabs in vial transport medium   NASOPHARYNGE  TESTING   Result Value Ref Range Status   SARS-CoV-2, NAA Not Detected Not Detected Final    Comment: This nucleic acid amplification test was developed and its performance characteristics determined by [employer]. Nucleic acid amplification tests include PCR and TMA. This test has not been FDA cleared or approved. This test has been authorized by FDA under an Emergency Use Authorization (EUA). This test is only authorized for the duration of time the declaration that circumstances exist justifying the authorization of the emergency use of in vitro diagnostic tests for detection of SARS-CoV-2 virus and/or diagnosis of COVID-19 infection under section 564(b)(1) of the Act, 21 U.S.C. 098JXB-1(Y) (1), unless the authorization is terminated or revoked sooner. When diagnostic testing is negative, the possibility of a false negative result should be considered in the context of a patient's recent exposures and the presence of clinical signs and symptoms consistent with COVID-19. An individual without symptoms of COVID-19 and who is not shedding SARS-CoV-2 virus would  expect to have a negative (not detected) result in this assay.     Imaging reviewed:  CT ANGIO UP EXTREM RIGHT W &/OR WO CONTRAST Result Date: 03/07/2024 CLINICAL DATA:  Acute onset right index finger discoloration and swelling. EXAM: CT ANGIOGRAPHY OF THE RIGHT UPPEREXTREMITY TECHNIQUE: Multidetector CT imaging of the right upperwas performed using the standard protocol during bolus administration of intravenous contrast. Multiplanar CT image reconstructions and MIPs were obtained to evaluate the vascular anatomy. RADIATION DOSE REDUCTION: This exam was performed according to the departmental dose-optimization program which includes automated exposure control, adjustment of the mA and/or kV according to patient size and/or use of iterative reconstruction technique. CONTRAST:  75mL OMNIPAQUE IOHEXOL 350 MG/ML SOLN COMPARISON:  None  Available. FINDINGS: Vascular The right subclavian, axillary, brachial, radial, and ulnar arteries are unremarkable. The deep and superficial palmar arches are unremarkable. The digital arteries are poorly evaluated. Bones/Joint/Cartilage No fracture or dislocation. Degenerative changes of the radiocarpal joint. No joint effusion. Ligaments Ligaments are suboptimally evaluated by CT. Muscles and Tendons Grossly intact. Soft tissue No fluid collection or subcutaneous emphysema.  No soft tissue mass. Mild lobular simple emphysema. Multiple nodules in the right upper lobe measuring up to 7 mm (series 4, image 76). Review of the MIP images confirms the above findings. IMPRESSION: 1. No acute vascular abnormality. Poor evaluation of the digital arteries is likely technical. Consider dedicated upper extremity angiogram for better evaluation as clinically indicated. 2. Multiple nodules in the right upper lobe measuring up to 7 mm. Non-contrast chest CT at 3-6 months is recommended. 3.  Emphysema (ICD10-J43.9). Electronically Signed   By: Obie Dredge M.D.   On: 03/07/2024 15:53    EKG:  Personally reviewed sinus bradycardia 54, no acute ischemic changes.  ED Course:  EDP consulted with  vascular surgery who recommended CTA which shows normal vasculature through the palmar branches with digital branches not well-visualized.  Recommended for nifedipine.   Assessment/Plan:  63 y.o. female with hx HTN, HLD, anxiety, current smoker, who was transferred from [facility] ED for digital ischemia.  Digital ischemia of right 2nd through 4th digits Acute onset with pain followed by cyanotic changes since 3/26. VS wnl. Exam with R 2nd digit worse than 3-4; does have associated weakness with flexion in the right second digit but no sensory loss.  Labs unremarkable.  CTA with patent vasculature through the pulm artery branches, digital branches not well-visualized possibly technical.  No clear etiology at this time,  considerations vasospastic felt most likely, otherwise evaluated cardioembolic, hypercoagulability, autoimmune.  History of smoking as her strongest risk factor and will need to stop, see additional discussion cessation below. -EDP consulted with vascular surgery Dr. Hetty Blend recommending for CTA reported as above and starting on nifedipine.  I also discussed with vascular surgery, due to distal nature involving digital branches there is no planned intervention from vascular surgery. Suspect vasospastic cause of her symptoms. Do not recommend for empiric anticoagulation unless embolic source is identified and no plan for angiography.  -Continue nifedipine 10 mg every 8 hour for now, can convert to 30 mg daily as long as tolerating. May uptitrate further as outpatient in 1 week.  -Nitroglycerin paste 0.25 inches on the right second, third, fourth digit every 6 hour -Heat to the right hand q4hour -Lab evaluation including ESR, CRP, ANA, C3, C4, cryoglobulins, SCL 70, anticentromere, coags, lupus anticoagulant panel, tox screen. Consider heme input re: need for additional hypercoagulability workup.  -TTE to eval for cardioembolic source -Serial NV checks right hand -Smoking cessation per below  Smoking cessation Current smoker 1/2 pack/day.  Discussed the possible relationship between her smoking history and vasospasm leading to digital ischemia.  Discussed that there is no safe amount of cigarettes to smoke and that in this situation we would also want avoid nicotine replacement if at all possible.  Discussed that in the most severe cases of for ischemia is prolonged may result in tissue loss or require amputation. - Hold off on nicotine replacement if possible - Would offer for bupropion if she is interested - Continue discussions about smoking cessation  Incidental findings on imaging: Lung nodules right upper lobe up to 7 mm (imaged on CTA of right upper extremity): Should have outpatient CT chest  in the short term to evaluate for additional lung nodules; once this is completed will also need follow-up CT depending on findings on the initial study. Was recommended for 3-6 mo for the RUL lesions.  This finding will need to be discussed with the patient, majority of initial encounter discussing her digital ischemia and smoking cessation and due to her anxiety around these subjects we did not discuss this additional finding during my initial interview  Emphysema by imaging: Noted, outpatient PCP follow-up.  Smoking cessation above.  Chronic medical problems: Hypertension: Hold her home triamterene-HCTZ to allow for additional blood pressure rhythm with nifedipine.  Also reduce home valsartan equivalent irbesartan to 150 mg again to allow for blood pressure room. Hyperlipidemia: Continue home atorvastatin, check lipids Anxiety: Xanax nightly as needed   Body mass index is 15.95 kg/m.    DVT prophylaxis:  Lovenox Code Status:  Full Code Diet:  Diet Orders (From admission, onward)     Start     Ordered   03/07/24 1957  Diet Heart Room service appropriate?  Yes; Fluid consistency: Thin  Diet effective now       Question Answer Comment  Room service appropriate? Yes   Fluid consistency: Thin      03/07/24 2006           Family Communication:  No   Consults:  Vascular surgery   Admission status:   Observation, Telemetry bed  Severity of Illness: The appropriate patient status for this patient is OBSERVATION. Observation status is judged to be reasonable and necessary in order to provide the required intensity of service to ensure the patient's safety. The patient's presenting symptoms, physical exam findings, and initial radiographic and laboratory data in the context of their medical condition is felt to place them at decreased risk for further clinical deterioration. Furthermore, it is anticipated that the patient will be medically stable for discharge from the hospital within 2  midnights of admission.    Dolly Rias, MD [facility]  How to contact the [facility] Attending or Consulting provider 7A - 7P or covering provider during after hours 7P -7A, for this patient.  Check the care team in [facility] and look for a) attending/consulting TRH provider listed and b) the [facility] team listed Log into [email or web address] and use [facility] universal password to access. If you do not have the password, please contact the hospital operator. Locate the [facility] provider you are looking for under [facility] and page to a number that you can be directly reached. If you still have difficulty reaching the provider, please page the [facility] (Director on Call) for the Hospitalists listed on amion for assistance.  03/08/2024, 12:09 AM

## 2024-03-08 NOTE — TOC Initial Note (Signed)
 Transition of Care [facility]) - Initial/Assessment Note    Patient Details  Name: Kristina Howe MRN: [id number] Date of Birth: [birth year]  [facility]) CM/SW Contact:    Ronny Bacon, RN Phone Number: 03/08/2024, 1:27 PM  Clinical Narrative:  Patient from home with boyfriend. Patient does not have any DME at home and has not used [facility] services in the past. Boyfriend able to transport patient home at discharge.                  Expected Discharge Plan: Home/Self Care Barriers to Discharge: Continued Medical Work up   Patient Goals and CMS Choice            Expected Discharge Plan and Services       Living arrangements for the past 2 months: Single Family Home                                      Prior Living Arrangements/Services Living arrangements for the past 2 months: Single Family Home Lives with:: Significant Other (Boyfriend) Patient language and need for interpreter reviewed:: Yes Do you feel safe going back to the place where you live?: Yes      Need for Family Participation in Patient Care: No (Comment) Care giver support system in place?: Yes (comment)   Criminal Activity/Legal Involvement Pertinent to Current Situation/Hospitalization: No - Comment as needed  Activities of Daily Living      Permission Sought/Granted                  Emotional Assessment Appearance:: Appears stated age Attitude/Demeanor/Rapport: Engaged Affect (typically observed): Appropriate Orientation: : Oriented to Self, Oriented to Place, Oriented to  Time, Oriented to Situation Alcohol / Substance Use: Not Applicable Psych Involvement: No (comment)  Admission diagnosis:  Finger pain, right [M79.644] Discoloration of skin of finger [L81.9] Ischemic finger [I99.8] Patient Active Problem List   Diagnosis Date Noted   Ischemic finger 03/07/2024   PCP:  Blair Heys, MD (Inactive) Pharmacy:   [facility] [address] [place] [address] Phone: [phone] Fax: [phone]  [id number] - [place], [place] - [address] [place] [address] Phone: [phone] Fax: [phone]  [id number] - [place], [place] - [address] [place] [address] Phone: [phone] Fax: [phone]     Social Drivers of Health (SDOH) Social History: SDOH Screenings   Food Insecurity: No Food Insecurity (03/08/2024)  Housing: Low Risk  (03/08/2024)  Transportation Needs: No Transportation Needs (03/08/2024)  Utilities: Not At Risk (03/08/2024)  Tobacco Use: High Risk (03/07/2024)   SDOH Interventions:     Readmission Risk Interventions     No data to display

## 2024-03-08 NOTE — Progress Notes (Signed)
  Echocardiogram 2D Echocardiogram has been performed.  Leda Roys RDCS 03/08/2024, 10:42 AM

## 2024-03-09 ENCOUNTER — Other Ambulatory Visit: Payer: Self-pay

## 2024-03-09 ENCOUNTER — Inpatient Hospital Stay (HOSPITAL_COMMUNITY)

## 2024-03-09 DIAGNOSIS — I998 Other disorder of circulatory system: Secondary | ICD-10-CM

## 2024-03-09 DIAGNOSIS — F17210 Nicotine dependence, cigarettes, uncomplicated: Secondary | ICD-10-CM

## 2024-03-09 DIAGNOSIS — I748 Embolism and thrombosis of other arteries: Secondary | ICD-10-CM

## 2024-03-09 DIAGNOSIS — Z79899 Other long term (current) drug therapy: Secondary | ICD-10-CM

## 2024-03-09 DIAGNOSIS — I2699 Other pulmonary embolism without acute cor pulmonale: Secondary | ICD-10-CM

## 2024-03-09 LAB — CBC WITH DIFFERENTIAL/PLATELET
Abs Immature Granulocytes: 0.01 10*3/uL (ref 0.00–0.07)
Basophils Absolute: 0.1 10*3/uL (ref 0.0–0.1)
Basophils Relative: 1 %
Eosinophils Absolute: 0.2 10*3/uL (ref 0.0–0.5)
Eosinophils Relative: 3 %
HCT: 33 % — ABNORMAL LOW (ref 36.0–46.0)
Hemoglobin: 10.9 g/dL — ABNORMAL LOW (ref 12.0–15.0)
Immature Granulocytes: 0 %
Lymphocytes Relative: 19 %
Lymphs Abs: 0.9 10*3/uL (ref 0.7–4.0)
MCH: 30.8 pg (ref 26.0–34.0)
MCHC: 33 g/dL (ref 30.0–36.0)
MCV: 93.2 fL (ref 80.0–100.0)
Monocytes Absolute: 0.5 10*3/uL (ref 0.1–1.0)
Monocytes Relative: 11 %
Neutro Abs: 3.1 10*3/uL (ref 1.7–7.7)
Neutrophils Relative %: 66 %
Platelets: 218 10*3/uL (ref 150–400)
RBC: 3.54 MIL/uL — ABNORMAL LOW (ref 3.87–5.11)
RDW: 14.1 % (ref 11.5–15.5)
WBC: 4.7 10*3/uL (ref 4.0–10.5)
nRBC: 0 % (ref 0.0–0.2)

## 2024-03-09 LAB — BASIC METABOLIC PANEL WITH GFR
Anion gap: 13 (ref 5–15)
BUN: 16 mg/dL (ref 8–23)
CO2: 26 mmol/L (ref 22–32)
Calcium: 9.7 mg/dL (ref 8.9–10.3)
Chloride: 100 mmol/L (ref 98–111)
Creatinine, Ser: 0.86 mg/dL (ref 0.44–1.00)
GFR, Estimated: >60 mL/min (ref >60)
Glucose, Bld: 93 mg/dL (ref 70–99)
Potassium: 3.7 mmol/L (ref 3.5–5.1)
Sodium: 139 mmol/L (ref 135–145)

## 2024-03-09 LAB — CENTROMERE ANTIBODIES: Centromere Ab Screen: <0.2 AI (ref 0.0–0.9)

## 2024-03-09 LAB — LUPUS ANTICOAGULANT PANEL
DRVVT: 47.1 s — ABNORMAL HIGH (ref 0.0–47.0)
PTT Lupus Anticoagulant: 30.3 s (ref 0.0–43.5)

## 2024-03-09 LAB — DRVVT MIX: dRVVT Mix: 42.7 s — ABNORMAL HIGH (ref 0.0–40.4)

## 2024-03-09 LAB — ANA W/REFLEX IF POSITIVE: Anti Nuclear Antibody (ANA): NEGATIVE

## 2024-03-09 LAB — DRVVT CONFIRM: dRVVT Confirm: 1.1 ratio (ref 0.8–1.2)

## 2024-03-09 LAB — ANTI-SCLERODERMA ANTIBODY: Scleroderma (Scl-70) (ENA) Antibody, IgG: <0.2 AI (ref 0.0–0.9)

## 2024-03-09 MED ORDER — IOHEXOL 350 MG/ML SOLN
75.0000 mL | Freq: Once | INTRAVENOUS | Status: AC | PRN
  Administered 2024-03-09: 75 mL via INTRAVENOUS

## 2024-03-09 MED ORDER — OXYCODONE HCL 5 MG PO TABS
5.0000 mg | ORAL_TABLET | ORAL | Status: DC | PRN
  Administered 2024-03-09 – 2024-03-11 (×3): 10 mg via ORAL
  Filled 2024-03-09 (×3): qty 2

## 2024-03-09 MED ORDER — HEPARIN (PORCINE) 25000 UT/250ML-% IV SOLN
800.0000 [IU]/h | INTRAVENOUS | Status: DC
  Administered 2024-03-09: 800 [IU]/h via INTRAVENOUS
  Administered 2024-03-11: 700 [IU]/h via INTRAVENOUS
  Filled 2024-03-09 (×2): qty 250

## 2024-03-09 MED ORDER — HEPARIN BOLUS VIA INFUSION
3000.0000 [IU] | Freq: Once | INTRAVENOUS | Status: AC
  Administered 2024-03-09: 3000 [IU] via INTRAVENOUS
  Filled 2024-03-09: qty 3000

## 2024-03-09 MED ORDER — HEPARIN BOLUS VIA INFUSION
3500.0000 [IU] | Freq: Once | INTRAVENOUS | Status: DC
  Filled 2024-03-09: qty 3500

## 2024-03-09 NOTE — Progress Notes (Signed)
 PROGRESS NOTE  Kristina Howe ZOX:[id number] DOB: 11/23/1961 DOA: 03/07/2024 PCP: Blair Heys, MD (Inactive)  HPI/Recap of past 24 hours: Kristina Howe is a 63 y.o. female with hx of HTN, HLD, anxiety, current smoker, who was transferred from [facility] ED for digital ischemia X 5 days. Pt noted aching in her right 2nd through 4th fingers and then the fingers became progressively blue discolored and cold.  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  She denies any numbness in the affected fingers.  Does have weakness with flexion especially at the right second digit/index finger.  No history of similar episodes in the past.  No other affected limbs.  No prior history of clots or clots in the family.  Denies any history of Raynaud's. No known history of autoimmune disease.  No IV drug use.     Today, patient still complaining of pain in the index finger, still blue.  Vascular surgery consulted    Assessment/Plan: Principal Problem:   Ischemic finger   Digital ischemia of right 2nd through 4th digits Improving, still persistent on the right index finger, denies any sensory loss Unclear etiology at this time, possibly Buerger's disease/Raynaud's, versus cardioembolic/hypercoagulability, history of smoking cigarettes Acute onset with pain followed by cyanotic changes since 3/26 sensory loss CTA with patent vasculature through the pulm artery branches, digital branches not well-visualized possibly technical Lab evaluation including ESR, CRP, ANA, C3, C4, cryoglobulins, SCL 70, anticentromere, coags, lupus anticoagulant panel, tox screen Echo with no evidence of thrombus or valvular vegetations, EF 60 to 65%, no regional wall motion abnormality, left ventricular diastolic parameters were normal EDP and admitting physician discussed with vascular surgery Dr. Hetty Blend recommending for CTA reported as above and  starting on nifedipine, no planned intervention from vascular surgery, suspect vasospastic cause of her symptoms. Does not recommend for empiric anticoagulation unless embolic source is identified and no plan for angiography Due to persistent index finger pain and blue discoloration, vascular surgery Dr. Randie Heinz consulted on 03/09/2024, recommend CTA chest Continue nifedipine, Nitroglycerin paste 0.25 inches on the right second, third, fourth digit Pain management Heat to the right hand Smoking cessation per below  Hypertension BP soft Continue only nifedipine for now, continue to hold triamterene-hydrochlorothiazide, valsartan   Hyperlipidemia LDL 105 Continue statins   Incidental findings on imaging Lung nodules right upper lobe up to 7 mm (imaged on CTA of right upper extremity) Recommend outpatient CT chest in the short term to evaluate for additional lung nodule and follow-up CT depending on findings on the initial study in 3-6 mo for the RUL lesions PCP to follow-up   Emphysema by imaging Denies any shortness of breath, on room air PCP follow-up Advised to quit tobacco   Anxiety Xanax nightly as needed  Smoking cessation Current smoker 1/2 pack/day, advised to quit, verbalized understanding Hold off on nicotine replacement if possible Would offer for bupropion if she is interested       Estimated body mass index is 15.95 kg/m as calculated from the following:   Height as of this encounter: 5\' 9"  (1.753 m).   Weight as of this encounter: 49 kg.     Code Status: Full  Family Communication: None at bedside  Disposition Plan: Status is: Inpatient The patient will require care spanning > 2 midnights and should be moved to inpatient because: Level of care      Consultants:  Vascular surgery  Procedures: None  Antimicrobials: None  DVT prophylaxis: Lovenox   Objective: Vitals:   03/09/24 0551 03/09/24 0836 03/09/24 1233 03/09/24 1531  BP: 118/70 103/77  115/82 102/82  Pulse: 62 64 (!) 59 (!) 59  Resp: 18 17 17 17   Temp: 98.7 F (37.1 C) 98.7 F (37.1 C) 98.7 F (37.1 C) 98.5 F (36.9 C)  TempSrc: Oral Oral Oral   SpO2: 99% 97% 97% 98%  Weight:      Height:       No intake or output data in the 24 hours ending 03/09/24 1757  Filed Weights   03/07/24 1206  Weight: 49 kg    Exam: General: NAD  Cardiovascular: S1, S2 present Respiratory: CTAB Abdomen: Soft, nontender, nondistended, bowel sounds present Musculoskeletal: No bilateral pedal edema noted, noted right index finger cool, blue/dusky, sensation intact Skin: Noted as above Psychiatry: Normal mood     Data Reviewed: CBC: Recent Labs  Lab 03/07/24 1434 03/08/24 0754 03/09/24 0618  WBC 6.5 6.2 4.7  NEUTROABS 4.9  --  3.1  HGB 11.7* 11.3* 10.9*  HCT 35.7* 34.8* 33.0*  MCV 93.5 93.0 93.2  PLT 241 253 218   Basic Metabolic Panel: Recent Labs  Lab 03/07/24 1434 03/08/24 0754 03/09/24 0618  NA 139 137 139  K 3.5 3.6 3.7  CL 105 102 100  CO2 23 22 26   GLUCOSE 81 79 93  BUN 29* 21 16  CREATININE 0.71 0.77 0.86  CALCIUM 9.7 9.7 9.7  MG  --  1.9  --   PHOS  --  2.6  --    GFR: Estimated Creatinine Clearance: 53.1 mL/min (by C-G formula based on SCr of 0.86 mg/dL). Liver Function Tests: Recent Labs  Lab 03/07/24 1434  AST 19  ALT 11  ALKPHOS 64  BILITOT 0.3  PROT 7.0  ALBUMIN 4.1   No results for input(s): "LIPASE", "AMYLASE" in the last 168 hours. No results for input(s): "AMMONIA" in the last 168 hours. Coagulation Profile: Recent Labs  Lab 03/07/24 2153  INR 1.2   Cardiac Enzymes: No results for input(s): "CKTOTAL", "CKMB", "CKMBINDEX", "TROPONINI" in the last 168 hours. BNP (last 3 results) No results for input(s): "PROBNP" in the last 8760 hours. HbA1C: Recent Labs    03/08/24 0754  HGBA1C 6.1*   CBG: No results for input(s): "GLUCAP" in the last 168 hours. Lipid Profile: Recent Labs    03/08/24 0754  CHOL 181  HDL 46   LDLCALC 105*  TRIG 151*  CHOLHDL 3.9   Thyroid Function Tests: No results for input(s): "TSH", "T4TOTAL", "FREET4", "T3FREE", "THYROIDAB" in the last 72 hours. Anemia Panel: No results for input(s): "VITAMINB12", "FOLATE", "FERRITIN", "TIBC", "IRON", "RETICCTPCT" in the last 72 hours. Urine analysis:    Component Value Date/Time   COLORURINE YELLOW 04/12/2022 1117   APPEARANCEUR CLEAR 04/12/2022 1117   LABSPEC 1.011 04/12/2022 1117   PHURINE 7.0 04/12/2022 1117   GLUCOSEU NEGATIVE 04/12/2022 1117   HGBUR NEGATIVE 04/12/2022 1117   BILIRUBINUR NEGATIVE 04/12/2022 1117   KETONESUR NEGATIVE 04/12/2022 1117   PROTEINUR NEGATIVE 04/12/2022 1117   NITRITE NEGATIVE 04/12/2022 1117   LEUKOCYTESUR NEGATIVE 04/12/2022 1117   Sepsis Labs: @LABRCNTIP (procalcitonin:4,lacticidven:4)  ) Recent Results (from the past 240 hours)  Blood culture (routine x 2)     Status: None (Preliminary result)   Collection Time: 03/07/24  2:33 PM   Specimen: BLOOD  Result Value Ref Range Status   Specimen Description   Final  BLOOD BLOOD LEFT FOREARM Performed at [facility], [address], [place], [place] [address]    Special Requests   Final    BOTTLES DRAWN AEROBIC AND ANAEROBIC Blood Culture adequate volume Performed at [facility], [address], [place], [place] [address]    Culture   Final    NO GROWTH 2 DAYS Performed at [facility], [address] [place], [place] [address]    Report Status PENDING  Incomplete  Blood culture (routine x 2)     Status: None (Preliminary result)   Collection Time: 03/07/24  2:46 PM   Specimen: BLOOD  Result Value Ref Range Status   Specimen Description   Final    BLOOD RIGHT ANTECUBITAL Performed at [facility], [address], [place], [place] [address]    Special Requests   Final    BOTTLES DRAWN AEROBIC AND ANAEROBIC Blood Culture adequate volume Performed at [facility], [address] [place], [place] [address]    Culture   Final    NO GROWTH 2 DAYS Performed at [facility], [address], [place], [place] [address]    Report Status PENDING  Incomplete      Studies: No results found.   Scheduled Meds:  atorvastatin  40 mg Oral Daily   enoxaparin (LOVENOX) injection  40 mg Subcutaneous Q24H   NIFEdipine  30 mg Oral Daily   nitroGLYCERIN  0.75 inch Topical Q6H    Continuous Infusions:   LOS: 1 day     Briant Cedar, MD [facility]  If 7PM-7AM, please contact night-coverage [email or web address] 03/09/2024, 5:57 PM

## 2024-03-09 NOTE — Plan of Care (Signed)

## 2024-03-09 NOTE — Consult Note (Addendum)
 Addendum: CT angio reviewed.  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  She will ultimately require surgical intervention with stenting of the subclavian artery proximally to prevent further embolization but this will not improve the flow to the distal ischemic digit.  Lemar Livings, MD     Hospital Consult    Reason for Consult: Ischemic right index finger Referring Physician: Dr. Sharolyn Douglas MRN #:  [id number]  History of Present Illness This is a 63 y.o. female without significant history of vascular disease.  She does have elevated cholesterol as well as hypertension and she does take statin daily.  She has a longtime smoker and works with her hands.  She had now a week history of index finger pain that she thought was initially secondary to mild trauma.  As the finger became colder and blue discolored she ultimately presented to [facility] and was admitted to [facility].  She has undergone CT angio of the right upper extremity as well as echocardiogram which were negative for any underlying findings.  She states that the pain is extreme at times and other times is controlled since she has been in the hospital.  She denies any history of clotting disorder or other autoimmune diseases.  She had a minor trauma but no significant trauma to the finger that she remembers.  She has a sister with a brain aneurysm but no personal or family history of aortic or other peripheral vascular aneurysms.  Past Medical History:  Diagnosis Date   Elevated cholesterol    Hypertension     Past Surgical History:  Procedure Laterality Date   ABDOMINAL HYSTERECTOMY      No Known Allergies  Prior to Admission medications   Medication Sig Start Date End Date Taking? Authorizing Provider  atorvastatin (LIPITOR) 20 MG tablet Take 20 mg by mouth daily. 02/15/24  Yes [provider]  omega-3 acid ethyl esters (LOVAZA) 1  g capsule Take 2 g by mouth daily.   Yes [provider]  tiZANidine (ZANAFLEX) 2 MG tablet Take 2 mg by mouth every 6 (six) hours as needed. 02/20/24  Yes [provider]  triamterene-hydrochlorothiazide (MAXZIDE-25) 37.5-25 MG per tablet Take 1 tablet by mouth daily.     Yes [provider]  valsartan (DIOVAN) 320 MG tablet Take 320 mg by mouth daily. 12/31/23  Yes [provider]  sucralfate (CARAFATE) 1 g tablet Take 1 g by mouth 2 (two) times daily. Patient not taking: Reported on 03/08/2024 12/11/23   [provider]    Social History   Socioeconomic History   Marital status: Significant Other    Spouse name: Not on file   Number of children: Not on file   Years of education: Not on file   Highest education level: Not on file  Occupational History   Not on file  Tobacco Use   Smoking status: Every Day    Current packs/day: 0.50    Types: Cigarettes   Smokeless tobacco: Not on file  Substance and Sexual Activity   Alcohol use: Yes   Drug use: No   Sexual activity: Not on file  Other Topics Concern   Not on file  Social History Narrative   Not on file   Social Drivers of Health   Financial Resource Strain: Not on file  Food Insecurity: No Food Insecurity (03/08/2024)   Hunger Vital Sign    Worried About  Running Out of Food in the Last Year: Never true    Ran Out of Food in the Last Year: Never true  Transportation Needs: No Transportation Needs (03/08/2024)   PRAPARE - Administrator, Civil Service (Medical): No    Lack of Transportation (Non-Medical): No  Physical Activity: Not on file  Stress: Not on file  Social Connections: Not on file  Intimate Partner Violence: Not At Risk (03/08/2024)   Humiliation, Afraid, Rape, and Kick questionnaire    Fear of Current or Ex-Partner: No    Emotionally Abused: No    Physically Abused: No    Sexually Abused: No    History reviewed. No pertinent family history.  Review  of Systems  Constitutional: Negative.   HENT: Negative.    Eyes: Negative.   Respiratory: Negative.    Cardiovascular: Negative.   Gastrointestinal: Negative.   Musculoskeletal:        Right index finger pain  Skin: Negative.   Neurological: Negative.   Psychiatric/Behavioral: Negative.        Physical Examination  Vitals:   03/09/24 1233 03/09/24 1531  BP: 115/82 102/82  Pulse: (!) 59 (!) 59  Resp: 17 17  Temp: 98.7 F (37.1 C) 98.5 F (36.9 C)  SpO2: 97% 98%   Body mass index is 15.95 kg/m.  Physical Exam HENT:     Head: Normocephalic.     Nose: Nose normal.  Cardiovascular:     Pulses:          Radial pulses are 2+ on the right side and 2+ on the left side.       Popliteal pulses are 2+ on the right side and 2+ on the left side.       Dorsalis pedis pulses are 2+ on the right side and 2+ on the left side.       Posterior tibial pulses are 2+ on the right side and 2+ on the left side.     Comments: Very strongly palpable right radial and ulnar pulses Pulmonary:     Effort: Pulmonary effort is normal.  Abdominal:     General: Abdomen is flat.  Skin:    Capillary Refill: Capillary refill takes less than 2 seconds.     Comments: Right index finger is dusky from the PIP distally  Neurological:     Mental Status: She is alert.  Psychiatric:        Mood and Affect: Mood normal.        Thought Content: Thought content normal.      CBC    Component Value Date/Time   WBC 4.7 03/09/2024 0618   RBC 3.54 (L) 03/09/2024 0618   HGB 10.9 (L) 03/09/2024 0618   HCT 33.0 (L) 03/09/2024 0618   PLT 218 03/09/2024 0618   MCV 93.2 03/09/2024 0618   MCH 30.8 03/09/2024 0618   MCHC 33.0 03/09/2024 0618   RDW 14.1 03/09/2024 0618   LYMPHSABS 0.9 03/09/2024 0618   MONOABS 0.5 03/09/2024 0618   EOSABS 0.2 03/09/2024 0618   BASOSABS 0.1 03/09/2024 0618    BMET    Component Value Date/Time   NA 139 03/09/2024 0618   K 3.7 03/09/2024 0618   CL 100 03/09/2024 0618    CO2 26 03/09/2024 0618   GLUCOSE 93 03/09/2024 0618   BUN 16 03/09/2024 0618   CREATININE 0.86 03/09/2024 0618   CALCIUM 9.7 03/09/2024 0618   GFRNONAA >60 03/09/2024 0618    COAGS: Lab Results  Component Value Date   INR 1.2 03/07/2024     Non-Invasive Vascular Imaging:   CT IMPRESSION: 1. No acute vascular abnormality. Poor evaluation of the digital arteries is likely technical. Consider dedicated upper extremity angiogram for better evaluation as clinically indicated. 2. Multiple nodules in the right upper lobe measuring up to 7 mm. Non-contrast chest CT at 3-6 months is recommended. 3.  Emphysema (ICD10-J43.9).    ASSESSMENT/PLAN: This is a 63 y.o. female admitted with dusky painful right index finger unfortunately she is right-hand dominant and works with her hands.  This appears to be similar to a blue toe phenomenon although right upper extremity CT angiography and echo have been negative for underlying source.  Full workup would include CTA of the chest to rule out embolizing source.  Regardless of source she may benefit from anticoagulation and hand surgery evaluation.  I did discuss with patient that if pain does not improve she is at risk of requiring index finger amputation and she demonstrates good understanding of this.  I will follow-up after CTA of chest is performed.  Laporsha Grealish C. Randie Heinz, MD [facility] Office: [phone] Pager: [phone]

## 2024-03-09 NOTE — Progress Notes (Addendum)
 PHARMACY - ANTICOAGULATION CONSULT NOTE  Pharmacy Consult for heparin Indication: pulmonary embolus  No Known Allergies  Patient Measurements: Height: 5\' 9"  (175.3 cm) Weight: 49 kg (108 lb) IBW/kg (Calculated) : 66.2 HEPARIN DW (KG): 49  Vital Signs: Temp: 98.1 F (36.7 C) (04/01 2045) Temp Source: Oral (04/01 2045) BP: 96/62 (04/01 2045) Pulse Rate: 63 (04/01 2045)  Labs: Recent Labs    03/07/24 1434 03/07/24 2153 03/08/24 0754 03/09/24 0618  HGB 11.7*  --  11.3* 10.9*  HCT 35.7*  --  34.8* 33.0*  PLT 241  --  253 218  APTT  --  27  --   --   LABPROT  --  15.4*  --   --   INR  --  1.2  --   --   CREATININE 0.71  --  0.77 0.86    Estimated Creatinine Clearance: 53.1 mL/min (by C-G formula based on SCr of 0.86 mg/dL).   Medical History: Past Medical History:  Diagnosis Date   Elevated cholesterol    Hypertension     Medications:  -No PTA anticoagulation  Assessment: 58 yoF presented to the [facility] with digital ischemia for 5 days with Cta showing acute to subacute PE with no evidence of RHS and evidence of thrombus of the R. Subclavian artery. Pharmacy consulted to [facility] heparin.  -Hgb 11s, plts 218 -Previously on Lovenox for DVT ppx (LD 4/1 ~2100)  Goal of Therapy:  Heparin level 0.3-0.7 units/ml Monitor platelets by anticoagulation protocol: Yes   Plan:  Give 3000 units bolus x 1 Start heparin infusion at 800 units/hr Check anti-Xa level in 6 hours and daily while on heparin Continue to monitor H&H and platelets  Arabella Merles, PharmD. Clinical Pharmacist 03/09/2024 10:31 PM

## 2024-03-09 NOTE — Progress Notes (Addendum)
 This patient has been admitted for right hand digital ischemia secondary to fourth digit unknown etiology. Initially Dr. Hetty Blend has been evaluated patient concern for vasospastic cause recommended conservative management and obtain CTA.   Currently patient is on nifedipine and Nitropaste applied to the fingers for pain management.   CT has been obtained findings are complicated and following: IMPRESSION: Complex examination.   Acute to subacute pulmonary emboli with small embolic burden. No CT evidence of right heart strain. Left lower lobe subsegmental pulmonary infarct.   Suspected atrial septal defect. Given the above findings, a paradoxical embolus is possible, but considered less likely within the right upper extremity given the high-grade stenosis described below the origin of the subclavian artery.   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 Associated free-floating thrombus with this plaque noted and is favored to represent embolic source for the reported right upper extremity peripheral embolic disease.   Pathologic mediastinal adenopathy, favored to represent nodal metastatic disease given the associated findings within the right lower lobe and left upper lobe. PET CT examination may be helpful for complete staging and to direct biopsy strategy.   Multiple pulmonary nodules, indeterminate, but suspicious for synchronous primary bronchogenic neoplasm within the right lower lobe and left upper lobe. This could be further assessed with PET CT examination.   Moderate emphysema   Aortic Atherosclerosis (ICD10-I70.0) and Emphysema (ICD10-J43.9).    Pulmonary embolism 2nd-4th digit digital ischemia Right subclavian artery stenosis Mediastinal lymphadenopathy concern for metastatic disease Multiple pulmonary nodule concern for bronchogenic carcinoma  Plan: - Starting  IV heparin drip for the management of PE And right upper extremity digital ischemia. - For the atrial septal defect however echo has been already done on 4/1 which showed EF 60 to 65% without any other abnormality.   - High-grade stenosis of the right subclavian artery origin calcified plaque and free-floating thrombus from the plaque.   - Patient has mediastinal lymphadenopathy favored nodal metastatic disease of the right upper lobe and lower lobe. Radiology recommended PET/CT exam to complete staging and direct biopsy. For this need to consult IR and and oncology.  - Multiple pulmonary nodule suspicious for primary bronchogenic neoplasm.  For this patient will need PET/CT scan.  Update. -Discussed the case with Dr. Randie Heinz who reviewed the CT imaging as well. 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 She will ultimately require surgical intervention with stenting of the subclavian artery proximally to prevent further embolization but this will not improve the flow to the distal ischemic digit.  - Tentative plan to take to the OR on Thursday 03/11/2024. -Please consult oncology and IR in the daytime regarding discussion about PET scan and lymph node biopsy.   Tereasa Coop, MD [facility] 03/09/2024, 10:22 PM

## 2024-03-10 ENCOUNTER — Telehealth: Payer: Self-pay | Admitting: Pulmonary Disease

## 2024-03-10 DIAGNOSIS — I998 Other disorder of circulatory system: Secondary | ICD-10-CM | POA: Diagnosis not present

## 2024-03-10 DIAGNOSIS — R222 Localized swelling, mass and lump, trunk: Secondary | ICD-10-CM

## 2024-03-10 LAB — CBC WITH DIFFERENTIAL/PLATELET
Abs Immature Granulocytes: 0.01 10*3/uL (ref 0.00–0.07)
Basophils Absolute: 0 10*3/uL (ref 0.0–0.1)
Basophils Relative: 1 %
Eosinophils Absolute: 0.2 10*3/uL (ref 0.0–0.5)
Eosinophils Relative: 4 %
HCT: 33.8 % — ABNORMAL LOW (ref 36.0–46.0)
Hemoglobin: 11 g/dL — ABNORMAL LOW (ref 12.0–15.0)
Immature Granulocytes: 0 %
Lymphocytes Relative: 20 %
Lymphs Abs: 0.9 10*3/uL (ref 0.7–4.0)
MCH: 30.4 pg (ref 26.0–34.0)
MCHC: 32.5 g/dL (ref 30.0–36.0)
MCV: 93.4 fL (ref 80.0–100.0)
Monocytes Absolute: 0.5 10*3/uL (ref 0.1–1.0)
Monocytes Relative: 11 %
Neutro Abs: 3 10*3/uL (ref 1.7–7.7)
Neutrophils Relative %: 64 %
Platelets: 218 10*3/uL (ref 150–400)
RBC: 3.62 MIL/uL — ABNORMAL LOW (ref 3.87–5.11)
RDW: 14.2 % (ref 11.5–15.5)
WBC: 4.7 10*3/uL (ref 4.0–10.5)
nRBC: 0 % (ref 0.0–0.2)

## 2024-03-10 LAB — C4 COMPLEMENT: Complement C4, Body Fluid: 36 mg/dL (ref 12–38)

## 2024-03-10 LAB — HEPARIN LEVEL (UNFRACTIONATED)
Heparin Unfractionated: 0.96 [IU]/mL — ABNORMAL HIGH (ref 0.30–0.70)
Heparin Unfractionated: 0.35 [IU]/mL (ref 0.30–0.70)

## 2024-03-10 LAB — BASIC METABOLIC PANEL WITH GFR
Anion gap: 11 (ref 5–15)
BUN: 15 mg/dL (ref 8–23)
CO2: 23 mmol/L (ref 22–32)
Calcium: 9.3 mg/dL (ref 8.9–10.3)
Chloride: 103 mmol/L (ref 98–111)
Creatinine, Ser: 0.72 mg/dL (ref 0.44–1.00)
GFR, Estimated: >60 mL/min (ref >60)
Glucose, Bld: 92 mg/dL (ref 70–99)
Potassium: 3.6 mmol/L (ref 3.5–5.1)
Sodium: 137 mmol/L (ref 135–145)

## 2024-03-10 LAB — C3 COMPLEMENT: C3 Complement: 185 mg/dL — ABNORMAL HIGH (ref 82–167)

## 2024-03-10 NOTE — Progress Notes (Addendum)
 PHARMACY - ANTICOAGULATION CONSULT NOTE  Pharmacy Consult for heparin Indication: pulmonary embolus  No Known Allergies  Patient Measurements: Height: 5\' 9"  (175.3 cm) Weight: 49 kg (108 lb) IBW/kg (Calculated) : 66.2 HEPARIN DW (KG): 49  Vital Signs: Temp: 98.4 F (36.9 C) (04/02 0515) Temp Source: Oral (04/02 0515) BP: 120/79 (04/02 0515) Pulse Rate: 67 (04/02 0515)  Labs: Recent Labs    03/07/24 2153 03/08/24 0754 03/09/24 0618 03/10/24 0535  HGB  --  11.3* 10.9* 11.0*  HCT  --  34.8* 33.0* 33.8*  PLT  --  253 218 218  APTT 27  --   --   --   LABPROT 15.4*  --   --   --   INR 1.2  --   --   --   HEPARINUNFRC  --   --   --  0.96*  CREATININE  --  0.77 0.86 0.72    Estimated Creatinine Clearance: 57.1 mL/min (by C-G formula based on SCr of 0.72 mg/dL).   Assessment: 37 yoF presented to the [facility] with digital ischemia for 5 days with CTA showing acute to subacute PE with no evidence of RHS and evidence of thrombus of the R subclavian artery. Pharmacy consulted to dose heparin.  Heparin level supra-therapeutic.  Confirmed with RN/patient that lab was obtained appropriately; no bleeding reported.  Goal of Therapy:  Heparin level 0.3-0.7 units/ml Monitor platelets by anticoagulation protocol: Yes   Plan:  Reduce heparin gtt to 650 units/hr Check 6 hr heparin level Daily heparin level and CBC  Doris Gruhn D. Laney Potash, PharmD, BCPS, BCCCP 03/10/2024, 7:06 AM  ==========================  Addendum: Repeat heparin level therapeutic at 0.35 units/mL Increase heparin slightly to 700 units/hr F/u AM labs  Trevante Tennell D. Laney Potash, PharmD, BCPS, BCCCP 03/10/2024, 1:42 PM

## 2024-03-10 NOTE — Telephone Encounter (Signed)
 Please schedule patient for office consult with Dr. Delton Coombes for pulmonary emboli and mediastinal adenopathy with lung nodules for evaluation of bronchoscopy.   Thanks, JD

## 2024-03-10 NOTE — Progress Notes (Signed)
 Interventional Radiology Brief Note:  Patient with history of COPD/emphysema, vascular disease admitted with shortness of breath.  She is found to have DVT/PE with ischemic right index finger.    CTA Chest with multiple findings including: Acute to subacute pulmonary emboli with small embolic burden. No CT evidence of right heart strain. Left lower lobe subsegmental pulmonary infarct.   Suspected atrial septal defect. Given the above findings, a paradoxical embolus is possible, but considered less likely within the right upper extremity given the high-grade stenosis described below the origin of the subclavian artery.   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 Associated free-floating thrombus with this plaque noted and is favored to represent embolic source for the reported right upper extremity peripheral embolic disease.   Pathologic mediastinal adenopathy, favored to represent nodal metastatic disease given the associated findings within the right lower lobe and left upper lobe. PET CT examination may be helpful for complete staging and to direct biopsy strategy.   Multiple pulmonary nodules, indeterminate, but suspicious for synchronous primary bronchogenic neoplasm within the right lower lobe and left upper lobe. This could be further assessed with PET CT examination.   IR consulted for biopsy of the mediastinal adenopathy.  Case reviewed by Dr. Loreta Ave who notes there is no percutaneous window for biopsy.  Additional review of chest, abdomen, pelvic imaging through January does not reveal and additional target for percutaneous biopsy.   Recommend consulting PCCM for possible bronchial biopsy of one of her lung nodules.   Order canceled.  No plans for procedure in IR at this time.   Loyce Dys, MS RD PA-C

## 2024-03-10 NOTE — Plan of Care (Signed)
   Problem: Education: Goal: Knowledge of General Education information will improve Description Including pain rating scale, medication(s)/side effects and non-pharmacologic comfort measures Outcome: Progressing

## 2024-03-10 NOTE — Progress Notes (Signed)
 PROGRESS NOTE    DELYLA SANDEEN  ZOX:[id number] DOB: 1961/10/22 DOA: 03/07/2024 PCP: Blair Heys, MD (Inactive)   Brief Narrative:  TANEKIA RYANS is a 63 y.o. female with hx of HTN, HLD, anxiety, current smoker, who was transferred from [facility] ED for digital ischemia X 5 days. Pt noted aching in her right 2nd through 4th fingers and then the fingers became progressively blue discolored and cold.  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  She denies any numbness in the affected fingers.  Does have weakness with flexion especially at the right second digit/index finger.  No history of similar episodes in the past.  No other affected limbs.  No prior history of clots or clots in the family.  Denies any history of Raynaud's. No known history of autoimmune disease.  No IV drug use.   Assessment & Plan:   Principal Problem:   Ischemic finger  Digital ischemia of right 2nd through 4th digits Acute versus subacute pulmonary emboli, POA Right subclavian artery high-grade stenosis, POA -Imaging overnight confirmed 90% stenosis of the right subclavian artery with free-floating thrombus as well as acute to subacute PE -Heparin drip initiated overnight - continue in the interim -Vascular surgery following, tentative plan for intervention 03/11/2024 -Smoking cessation per below   Incidental findings on imaging Lung nodules right upper lobe up to 7 mm (imaged on CTA of right upper extremity) IR asked to evaluate for possible biopsy- unfortunately there are no avenues for biopsy Recommendation for pulmonology evaluation and bronch/biopsy of the mediastinum - discussed with pulm patient will need 2 weeks of anticoagulation before bronch can be scheduled. Pulm office notified and will follow up with patient after discharge.  Hypertension Within normal limits on nifedipine only Hold HCTZ/triamterene/valsartan    Hyperlipidemia LDL 105 Continue statins   Anxiety Xanax nightly as needed   Tobacco abuse Rule out emphysema Discussed smoking cessation Current smoker 1/2 pack/day - patient wants to quit based on above Imaging consistent with emphysema but remains asymptomatic  DVT prophylaxis: Heparin drip Code Status:   Code Status: Full Code Family Communication: None present  Status is: Inpatient  Dispo: The patient is from: Home              Anticipated d/c is to: Home              Anticipated d/c date is: 48 to 72 hours              Patient currently not medically stable for discharge  Consultants:  Vascular surgery, IR, pulmonology  Procedures:  None, potential intervention 03/11/24 with vascular surgery  Antimicrobials:  None  Subjective: No acute issues or events overnight, reports hand is still somewhat painful but improved on current regimen.  Otherwise denies nausea vomiting diarrhea constipation headache fevers chills or shortness of breath  Objective: Vitals:   03/09/24 1233 03/09/24 1531 03/09/24 2045 03/10/24 0515  BP: 115/82 102/82 96/62 120/79  Pulse: (!) 59 (!) 59 63 67  Resp: 17 17 17 17   Temp: 98.7 F (37.1 C) 98.5 F (36.9 C) 98.1 F (36.7 C) 98.4 F (36.9 C)  TempSrc: Oral  Oral Oral  SpO2: 97% 98% 98% 95%  Weight:      Height:        Intake/Output Summary (Last 24 hours) at 03/10/2024 0751 Last data filed at 03/10/2024 0100 Gross per 24 hour  Intake 164.34 ml  Output --  Net 164.34 ml   Filed Weights   03/07/24 1206  Weight: 49 kg    Examination:  General:  Pleasantly resting in bed, No acute distress. HEENT:  Normocephalic atraumatic.  Sclerae nonicteric, noninjected.  Extraocular movements intact bilaterally. Neck:  Without mass or deformity.  Trachea is midline. Lungs:  Clear to auscultate bilaterally without rhonchi, wheeze, or rales. Heart:  Regular rate and rhythm.  Without murmurs, rubs, or gallops. Abdomen:  Soft, nontender,  nondistended.  Without guarding or rebound. Extremities: Palpable pulses bilaterally, right index finger and middle finger cool to touch, dusky Skin:  Warm and dry, no erythema.   Data Reviewed: I have personally reviewed following labs and imaging studies  CBC: Recent Labs  Lab 03/07/24 1434 03/08/24 0754 03/09/24 0618 03/10/24 0535  WBC 6.5 6.2 4.7 4.7  NEUTROABS 4.9  --  3.1 3.0  HGB 11.7* 11.3* 10.9* 11.0*  HCT 35.7* 34.8* 33.0* 33.8*  MCV 93.5 93.0 93.2 93.4  PLT 241 253 218 218   Basic Metabolic Panel: Recent Labs  Lab 03/07/24 1434 03/08/24 0754 03/09/24 0618 03/10/24 0535  NA 139 137 139 137  K 3.5 3.6 3.7 3.6  CL 105 102 100 103  CO2 23 22 26 23   GLUCOSE 81 79 93 92  BUN 29* 21 16 15   CREATININE 0.71 0.77 0.86 0.72  CALCIUM 9.7 9.7 9.7 9.3  MG  --  1.9  --   --   PHOS  --  2.6  --   --    GFR: Estimated Creatinine Clearance: 57.1 mL/min (by C-G formula based on SCr of 0.72 mg/dL). Liver Function Tests: Recent Labs  Lab 03/07/24 1434  AST 19  ALT 11  ALKPHOS 64  BILITOT 0.3  PROT 7.0  ALBUMIN 4.1   Coagulation Profile: Recent Labs  Lab 03/07/24 2153  INR 1.2   HbA1C: Recent Labs    03/08/24 0754  HGBA1C 6.1*   Lipid Profile: Recent Labs    03/08/24 0754  CHOL 181  HDL 46  LDLCALC 105*  TRIG 151*  CHOLHDL 3.9     Recent Results (from the past 240 hours)  Blood culture (routine x 2)     Status: None (Preliminary result)   Collection Time: 03/07/24  2:33 PM   Specimen: BLOOD  Result Value Ref Range Status   Specimen Description   Final    BLOOD BLOOD LEFT FOREARM Performed at [facility], [address], [place], [place] [address]    Special Requests   Final    BOTTLES DRAWN AEROBIC AND ANAEROBIC Blood Culture adequate volume Performed at [facility], [address] [place], [place] [address]    Culture   Final    NO GROWTH 2 DAYS Performed at [facility], [address] [place], [place] [address]    Report Status PENDING  Incomplete  Blood culture (routine x 2)     Status: None (Preliminary result)   Collection Time: 03/07/24  2:46 PM   Specimen: BLOOD  Result Value Ref Range Status   Specimen Description   Final    BLOOD RIGHT ANTECUBITAL Performed at [facility], [address] [place], [place] [address]    Special Requests   Final    BOTTLES DRAWN AEROBIC AND ANAEROBIC Blood Culture adequate volume Performed at [facility], [address], [place], [place] [address]    Culture   Final  NO GROWTH 2 DAYS Performed at [facility], [address], [place], [place] [address]    Report Status PENDING  Incomplete         Radiology Studies: CT ANGIO CHEST AORTA W/CM & OR WO/CM Result Date: 03/09/2024 CLINICAL DATA:  Upper extremity arterial embolization. EXAM: CT ANGIOGRAPHY CHEST WITH CONTRAST TECHNIQUE: Multidetector CT imaging of the chest was performed using the standard protocol during bolus administration of intravenous contrast. Multiplanar CT image reconstructions and MIPs were obtained to evaluate the vascular anatomy. RADIATION DOSE REDUCTION: This exam was performed according to the departmental dose-optimization program which includes automated exposure control, adjustment of the mA and/or kV according to patient size and/or use of iterative reconstruction technique. CONTRAST:  75mL OMNIPAQUE IOHEXOL 350 MG/ML SOLN COMPARISON:  None Available. FINDINGS: Cardiovascular: There is preferential opacification of the pulmonary arterial tree opacification of the systemic arterial structures is adequate. There are multiple branching intraluminal filling defects identified, some of which appears eccentric, within the segmental pulmonary arteries of the lower lobes bilaterally in keeping with acute to subacute pulmonary emboli. The embolic burden is small. The central pulmonary arteries are enlarged in  keeping with changes of pulmonary arterial hypertension. There is no CT evidence of right heart strain. Mild coronary artery calcification. Global cardiac size is within normal limits. Possible atrial septal defect passage of contrast across the interatrial septum at axial image # 93/10. No pericardial effusion. The thoracic aorta is of normal caliber. Mild atherosclerotic calcification within the thoracic aorta. 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 There is, however, bulky calcified irregular atherosclerotic plaque at the origin of the right subclavian artery which results in a 85-95% stenosis of the right subclavian artery at its origin. There is a small amount of associated free-floating intraluminal thrombus with this plaque best seen on image # 21/10, 79/13 and 77/14. Mediastinum/Nodes: There is pathologic right paratracheal and subcarinal adenopathy with the index lymph node measuring 3.8 x 4.0 cm at axial image # 30/10. This likely represents nodal metastatic disease unknown primary a or a lymphoproliferative process such as lymphoma. Visualized thyroid is unremarkable. The esophagus demonstrates circumferential wall thickening in its mid segment, not well characterized on this examination, possibly related to inflammatory process such as esophagitis, or infiltrative disease. Lungs/Pleura: There is moderate emphysema with marked pulmonary hyperinflation. A spiculated nodule is seen within the right lower lobe measuring 9 mm in mean diameter at axial image # 95/11, possibly representing a primary lesion as be seen in the setting small cell lung cancer. 5 mm indeterminate pulmonary nodule is seen within the right upper lobe, axial image # 71/11. A elongate peribronchial nodule is seen within the left upper lobe with a mean diameter 15 mm, indeterminate but also suspicious for a possible synchronous primary bronchogenic  neoplasm. More focal consolidation within the subpleural left lower lobe is in keeping with a subacute pulmonary infarct. No pneumothorax or pleural effusion. Upper Abdomen: No acute abnormality. Musculoskeletal: No acute bone abnormality. No suspicious lytic or blastic bone lesion. Remote superior endplate fracture of L1 with mild loss of height anteriorly. Review of the MIP images confirms the above findings. IMPRESSION: Complex examination. Acute to subacute pulmonary emboli with small embolic burden. No CT evidence of right heart strain. Left lower lobe subsegmental pulmonary infarct. Suspected atrial septal defect. Given the above findings, a paradoxical embolus is possible, but considered less likely within the right upper extremity given  the high-grade stenosis described below the origin of the subclavian artery. 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 Associated free-floating thrombus with this plaque noted and is favored to represent embolic source for the reported right upper extremity peripheral embolic disease. Pathologic mediastinal adenopathy, favored to represent nodal metastatic disease given the associated findings within the right lower lobe and left upper lobe. PET CT examination may be helpful for complete staging and to direct biopsy strategy. Multiple pulmonary nodules, indeterminate, but suspicious for synchronous primary bronchogenic neoplasm within the right lower lobe and left upper lobe. This could be further assessed with PET CT examination. Moderate emphysema Aortic Atherosclerosis (ICD10-I70.0) and Emphysema (ICD10-J43.9). Electronically Signed   By: Helyn Numbers M.D.   On: 03/09/2024 20:57   ECHOCARDIOGRAM COMPLETE Result Date: 03/08/2024    ECHOCARDIOGRAM REPORT   Patient Name:   MADGE THERRIEN Date of Exam: 03/08/2024 Medical Rec #:  [id number]      Height:       69.0 in Accession  #:    [id number]     Weight:       108.0 lb Date of Birth:  Jun 27, 1961      BSA:          1.590 m Patient Age:    62 years       BP:           108/65 mmHg Patient Gender: F              HR:           55 bpm. Exam Location:  Inpatient Procedure: 2D Echo, Color Doppler and Cardiac Doppler (Both Spectral and Color            Flow Doppler were utilized during procedure). Indications:    Endocarditis I38  History:        Patient has no prior history of Echocardiogram examinations.  Sonographer:    Harriette Bouillon RDCS Referring Phys: [id number] Dolly Rias IMPRESSIONS  1. Left ventricular ejection fraction, by estimation, is 60 to 65%. The left ventricle has normal function. The left ventricle has no regional wall motion abnormalities. Left ventricular diastolic parameters were normal.  2. Right ventricular systolic function is normal. The right ventricular size is normal. There is normal pulmonary artery systolic pressure.  3. The mitral valve is normal in structure. No evidence of mitral valve regurgitation. No evidence of mitral stenosis.  4. The aortic valve is normal in structure. Aortic valve regurgitation is not visualized. No aortic stenosis is present.  5. The inferior vena cava is normal in size with greater than 50% respiratory variability, suggesting right atrial pressure of 3 mmHg. Conclusion(s)/Recommendation(s): No evidence of valvular vegetations on this transthoracic echocardiogram. Consider a transesophageal echocardiogram to exclude infective endocarditis if clinically indicated. FINDINGS  Left Ventricle: Left ventricular ejection fraction, by estimation, is 60 to 65%. The left ventricle has normal function. The left ventricle has no regional wall motion abnormalities. The left ventricular internal cavity size was normal in size. There is  no left ventricular hypertrophy. Left ventricular diastolic parameters were normal. Right Ventricle: The right ventricular size is normal. No increase in right  ventricular wall thickness. Right ventricular systolic function is normal. There is normal pulmonary artery systolic pressure. The tricuspid regurgitant velocity is 2.30 m/s, and  with an assumed right atrial pressure of 3 mmHg, the estimated right ventricular systolic pressure is 24.2 mmHg. Left Atrium: Left atrial  size was normal in size. Right Atrium: Right atrial size was normal in size. Pericardium: There is no evidence of pericardial effusion. Mitral Valve: The mitral valve is normal in structure. No evidence of mitral valve regurgitation. No evidence of mitral valve stenosis. Tricuspid Valve: The tricuspid valve is normal in structure. Tricuspid valve regurgitation is trivial. No evidence of tricuspid stenosis. Aortic Valve: The aortic valve is normal in structure. Aortic valve regurgitation is not visualized. No aortic stenosis is present. Pulmonic Valve: The pulmonic valve was normal in structure. Pulmonic valve regurgitation is not visualized. No evidence of pulmonic stenosis. Aorta: The aortic root is normal in size and structure. Venous: The inferior vena cava is normal in size with greater than 50% respiratory variability, suggesting right atrial pressure of 3 mmHg. IAS/Shunts: No atrial level shunt detected by color flow Doppler.  LEFT VENTRICLE PLAX 2D LVIDd:         4.20 cm   Diastology LVIDs:         3.10 cm   LV e' medial:    7.62 cm/s LV PW:         1.00 cm   LV E/e' medial:  13.1 LV IVS:        1.10 cm   LV e' lateral:   10.60 cm/s LVOT diam:     1.90 cm   LV E/e' lateral: 9.4 LV SV:         76 LV SV Index:   48 LVOT Area:     2.84 cm  RIGHT VENTRICLE             IVC RV S prime:     11.40 cm/s  IVC diam: 1.40 cm TAPSE (M-mode): 2.4 cm LEFT ATRIUM             Index        RIGHT ATRIUM           Index LA diam:        3.00 cm 1.89 cm/m   RA Area:     11.50 cm LA Vol (A2C):   24.9 ml 15.66 ml/m  RA Volume:   22.80 ml  14.34 ml/m LA Vol (A4C):   38.9 ml 24.47 ml/m LA Biplane Vol: 32.9 ml 20.69  ml/m  AORTIC VALVE LVOT Vmax:   117.00 cm/s LVOT Vmean:  77.000 cm/s LVOT VTI:    0.267 m  AORTA Ao Root diam: 2.70 cm Ao Asc diam:  3.00 cm MITRAL VALVE                TRICUSPID VALVE MV Area (PHT): 3.17 cm     TR Peak grad:   21.2 mmHg MV Decel Time: 239 msec     TR Vmax:        230.00 cm/s MV E velocity: 100.00 cm/s MV A velocity: 77.60 cm/s   SHUNTS MV E/A ratio:  1.29         Systemic VTI:  0.27 m                             Systemic Diam: 1.90 cm Mihai Croitoru MD Electronically signed by Thurmon Fair MD Signature Date/Time: 03/08/2024/2:17:42 PM    Final         Scheduled Meds:  atorvastatin  40 mg Oral Daily   NIFEdipine  30 mg Oral Daily   nitroGLYCERIN  0.75 inch Topical Q6H   Continuous Infusions:  heparin 650 Units/hr (  03/10/24 0705)     LOS: 2 days   Time spent:  Azucena Fallen, DO [facility]  If 7PM-7AM, please contact night-coverage [email or web address]  03/10/2024, 7:51 AM

## 2024-03-10 NOTE — Progress Notes (Addendum)
  Progress Note    03/10/2024 8:39 AM Hospital Day 2  Subjective:  says her right index finger is still very painful.  afebrile  Vitals:   03/09/24 2045 03/10/24 0515  BP: 96/62 120/79  Pulse: 63 67  Resp: 17 17  Temp: 98.1 F (36.7 C) 98.4 F (36.9 C)  SpO2: 98% 95%    Physical Exam: General:  no distress Lungs:  non labored Extremities:  palpable bilateral radial and DP pulses  CBC    Component Value Date/Time   WBC 4.7 03/10/2024 0535   RBC 3.62 (L) 03/10/2024 0535   HGB 11.0 (L) 03/10/2024 0535   HCT 33.8 (L) 03/10/2024 0535   PLT 218 03/10/2024 0535   MCV 93.4 03/10/2024 0535   MCH 30.4 03/10/2024 0535   MCHC 32.5 03/10/2024 0535   RDW 14.2 03/10/2024 0535   LYMPHSABS 0.9 03/10/2024 0535   MONOABS 0.5 03/10/2024 0535   EOSABS 0.2 03/10/2024 0535   BASOSABS 0.0 03/10/2024 0535    BMET    Component Value Date/Time   NA 137 03/10/2024 0535   K 3.6 03/10/2024 0535   CL 103 03/10/2024 0535   CO2 23 03/10/2024 0535   GLUCOSE 92 03/10/2024 0535   BUN 15 03/10/2024 0535   CREATININE 0.72 03/10/2024 0535   CALCIUM 9.3 03/10/2024 0535   GFRNONAA >60 03/10/2024 0535    INR    Component Value Date/Time   INR 1.2 03/07/2024 2153     Intake/Output Summary (Last 24 hours) at 03/10/2024 0839 Last data filed at 03/10/2024 0100 Gross per 24 hour  Intake 164.34 ml  Output --  Net 164.34 ml     Assessment/Plan:  63 y.o. female with ischemic right index finger  Hospital Day 2  -CTA obtained and pt has been started on heparin gtt for PE.  There is evidence of thrombus at the takeoff of the right subclavian artery. She will ultimately require surgical intervention with stenting of the subclavian artery proximally to prevent further embolization but this will not improve the flow to the distal ischemic digit.  -Dr. Randie Howe to determine timing of the above.    Kristina Massed, PA-C [facility] [phone] 03/10/2024 8:39 AM  I have  independently interviewed and examined patient and agree with PA assessment and plan above.  Right index finger remains painful despite heparin.  She is now found to have chest mass and pulmonary emboli.  Plan will be for stent of right subclavian artery in OR tomorrow.  Okay to continue heparin she will be n.p.o. past midnight.  Kristina Schaffer C. Randie Heinz, MD [facility] [place] Office: [phone] Pager: [phone]

## 2024-03-11 ENCOUNTER — Encounter (HOSPITAL_COMMUNITY): Payer: Self-pay | Admitting: Internal Medicine

## 2024-03-11 ENCOUNTER — Encounter (HOSPITAL_COMMUNITY)
Admission: EM | Disposition: A | Discharge status: Home / Self Care | Payer: Self-pay | Source: Home / Self Care | Attending: Internal Medicine

## 2024-03-11 ENCOUNTER — Inpatient Hospital Stay (HOSPITAL_COMMUNITY): Payer: Self-pay | Admitting: Certified Registered"

## 2024-03-11 ENCOUNTER — Other Ambulatory Visit: Payer: Self-pay

## 2024-03-11 ENCOUNTER — Inpatient Hospital Stay (HOSPITAL_COMMUNITY)

## 2024-03-11 DIAGNOSIS — I10 Essential (primary) hypertension: Secondary | ICD-10-CM

## 2024-03-11 DIAGNOSIS — I998 Other disorder of circulatory system: Secondary | ICD-10-CM

## 2024-03-11 DIAGNOSIS — I70228 Atherosclerosis of native arteries of extremities with rest pain, other extremity: Secondary | ICD-10-CM

## 2024-03-11 DIAGNOSIS — F17210 Nicotine dependence, cigarettes, uncomplicated: Secondary | ICD-10-CM | POA: Diagnosis not present

## 2024-03-11 DIAGNOSIS — I6501 Occlusion and stenosis of right vertebral artery: Secondary | ICD-10-CM

## 2024-03-11 HISTORY — PX: THORACIC EXPOSURE: SHX6114

## 2024-03-11 HISTORY — PX: AORTOGRAM: SHX6300

## 2024-03-11 HISTORY — PX: INSERTION OF RETROGRADE CAROTID STENT: SHX5869

## 2024-03-11 HISTORY — PX: UPPER EXTREMITY ANGIOGRAM: SHX6310

## 2024-03-11 HISTORY — PX: ULTRASOUND GUIDANCE FOR VASCULAR ACCESS: SHX6516

## 2024-03-11 LAB — HEPARIN LEVEL (UNFRACTIONATED): Heparin Unfractionated: 0.2 [IU]/mL — ABNORMAL LOW (ref 0.30–0.70)

## 2024-03-11 LAB — BASIC METABOLIC PANEL WITH GFR
Anion gap: 9 (ref 5–15)
BUN: 19 mg/dL (ref 8–23)
CO2: 25 mmol/L (ref 22–32)
Calcium: 9.7 mg/dL (ref 8.9–10.3)
Chloride: 104 mmol/L (ref 98–111)
Creatinine, Ser: 0.8 mg/dL (ref 0.44–1.00)
GFR, Estimated: >60 mL/min (ref >60)
Glucose, Bld: 89 mg/dL (ref 70–99)
Potassium: 4 mmol/L (ref 3.5–5.1)
Sodium: 138 mmol/L (ref 135–145)

## 2024-03-11 LAB — POCT I-STAT 7, (LYTES, BLD GAS, ICA,H+H)
Acid-base deficit: 1 mmol/L (ref 0.0–2.0)
Bicarbonate: 25.5 mmol/L (ref 20.0–28.0)
Calcium, Ion: 1.29 mmol/L (ref 1.15–1.40)
HCT: 31 % — ABNORMAL LOW (ref 36.0–46.0)
Hemoglobin: 10.5 g/dL — ABNORMAL LOW (ref 12.0–15.0)
O2 Saturation: 100 %
Potassium: 4.1 mmol/L (ref 3.5–5.1)
Sodium: 138 mmol/L (ref 135–145)
TCO2: 27 mmol/L (ref 22–32)
pCO2 arterial: 47.1 mmHg (ref 32–48)
pH, Arterial: 7.341 — ABNORMAL LOW (ref 7.35–7.45)
pO2, Arterial: 195 mmHg — ABNORMAL HIGH (ref 83–108)

## 2024-03-11 LAB — ABO/RH: ABO/RH(D): O POS

## 2024-03-11 LAB — CBC
HCT: 34.1 % — ABNORMAL LOW (ref 36.0–46.0)
Hemoglobin: 11.2 g/dL — ABNORMAL LOW (ref 12.0–15.0)
MCH: 30.7 pg (ref 26.0–34.0)
MCHC: 32.8 g/dL (ref 30.0–36.0)
MCV: 93.4 fL (ref 80.0–100.0)
Platelets: 211 10*3/uL (ref 150–400)
RBC: 3.65 MIL/uL — ABNORMAL LOW (ref 3.87–5.11)
RDW: 14.1 % (ref 11.5–15.5)
WBC: 4.3 10*3/uL (ref 4.0–10.5)
nRBC: 0 % (ref 0.0–0.2)

## 2024-03-11 LAB — MRSA NEXT GEN BY PCR, NASAL: MRSA by PCR Next Gen: NOT DETECTED

## 2024-03-11 LAB — POCT ACTIVATED CLOTTING TIME
Activated Clotting Time: 204 s
Activated Clotting Time: 239 s
Activated Clotting Time: 256 s
Activated Clotting Time: 216 s
Activated Clotting Time: 239 s

## 2024-03-11 LAB — TYPE AND SCREEN
ABO/RH(D): O POS
Antibody Screen: NEGATIVE

## 2024-03-11 SURGERY — INSERTION, STENT, ARTERY, CAROTID, RETROGRADE
Anesthesia: General | Site: Neck | Laterality: Right

## 2024-03-11 MED ORDER — FENTANYL CITRATE (PF) 100 MCG/2ML IJ SOLN
INTRAMUSCULAR | Status: AC
  Filled 2024-03-11: qty 2

## 2024-03-11 MED ORDER — PROPOFOL 10 MG/ML IV BOLUS
INTRAVENOUS | Status: AC
  Filled 2024-03-11: qty 20

## 2024-03-11 MED ORDER — DOCUSATE SODIUM 100 MG PO CAPS
100.0000 mg | ORAL_CAPSULE | Freq: Every day | ORAL | Status: DC
  Administered 2024-03-12: 100 mg via ORAL
  Filled 2024-03-11: qty 1

## 2024-03-11 MED ORDER — ONDANSETRON HCL 4 MG/2ML IJ SOLN
INTRAMUSCULAR | Status: AC
  Filled 2024-03-11: qty 2

## 2024-03-11 MED ORDER — OXYCODONE-ACETAMINOPHEN 5-325 MG PO TABS
1.0000 | ORAL_TABLET | ORAL | Status: DC | PRN
  Administered 2024-03-12 (×2): 2 via ORAL
  Filled 2024-03-11 (×2): qty 2

## 2024-03-11 MED ORDER — MEPERIDINE HCL 25 MG/ML IJ SOLN: 6.2500 mg | INTRAMUSCULAR | Status: DC | PRN

## 2024-03-11 MED ORDER — CHLORHEXIDINE GLUCONATE 0.12 % MT SOLN: 15.0000 mL | Freq: Once | OROMUCOSAL | Status: AC

## 2024-03-11 MED ORDER — HEPARIN SODIUM (PORCINE) 1000 UNIT/ML IJ SOLN
INTRAMUSCULAR | Status: DC | PRN
  Administered 2024-03-11: 3000 [IU] via INTRAVENOUS
  Administered 2024-03-11: 2000 [IU] via INTRAVENOUS
  Administered 2024-03-11: 5000 [IU] via INTRAVENOUS
  Administered 2024-03-11: 2000 [IU] via INTRAVENOUS

## 2024-03-11 MED ORDER — PHENYLEPHRINE HCL-NACL 20-0.9 MG/250ML-% IV SOLN
INTRAVENOUS | Status: DC | PRN
  Administered 2024-03-11: 25 ug/min via INTRAVENOUS

## 2024-03-11 MED ORDER — PANTOPRAZOLE SODIUM 40 MG PO TBEC
40.0000 mg | DELAYED_RELEASE_TABLET | Freq: Every day | ORAL | Status: DC
  Administered 2024-03-11 – 2024-03-12 (×2): 40 mg via ORAL
  Filled 2024-03-11 (×2): qty 1

## 2024-03-11 MED ORDER — DEXAMETHASONE SODIUM PHOSPHATE 10 MG/ML IJ SOLN
INTRAMUSCULAR | Status: AC
  Filled 2024-03-11: qty 1

## 2024-03-11 MED ORDER — GUAIFENESIN-DM 100-10 MG/5ML PO SYRP: 15.0000 mL | ORAL_SOLUTION | ORAL | Status: DC | PRN

## 2024-03-11 MED ORDER — CEFAZOLIN SODIUM-DEXTROSE 2-3 GM-%(50ML) IV SOLR
INTRAVENOUS | Status: DC | PRN
  Administered 2024-03-11: 2 g via INTRAVENOUS

## 2024-03-11 MED ORDER — ASPIRIN 81 MG PO TBEC
81.0000 mg | DELAYED_RELEASE_TABLET | Freq: Every day | ORAL | Status: DC
  Administered 2024-03-12: 81 mg via ORAL
  Filled 2024-03-11: qty 1

## 2024-03-11 MED ORDER — PHENOL 1.4 % MT LIQD: 1.0000 | OROMUCOSAL | Status: DC | PRN

## 2024-03-11 MED ORDER — ALBUMIN HUMAN 5 % IV SOLN: INTRAVENOUS | Status: DC | PRN

## 2024-03-11 MED ORDER — LIDOCAINE 2% (20 MG/ML) 5 ML SYRINGE
INTRAMUSCULAR | Status: DC | PRN
  Administered 2024-03-11: 20 mg via INTRAVENOUS

## 2024-03-11 MED ORDER — CEFAZOLIN SODIUM-DEXTROSE 2-4 GM/100ML-% IV SOLN
2.0000 g | Freq: Three times a day (TID) | INTRAVENOUS | Status: AC
  Administered 2024-03-11 – 2024-03-12 (×2): 2 g via INTRAVENOUS
  Filled 2024-03-11 (×2): qty 100

## 2024-03-11 MED ORDER — FENTANYL CITRATE (PF) 250 MCG/5ML IJ SOLN
INTRAMUSCULAR | Status: AC
  Filled 2024-03-11: qty 5

## 2024-03-11 MED ORDER — NITROGLYCERIN 1 MG/10 ML FOR IR/CATH LAB
INTRA_ARTERIAL | Status: DC | PRN
  Administered 2024-03-11: 200 ug via INTRA_ARTERIAL

## 2024-03-11 MED ORDER — PHENYLEPHRINE HCL (PRESSORS) 10 MG/ML IV SOLN
INTRAVENOUS | Status: DC | PRN
  Administered 2024-03-11 (×3): 160 ug via INTRAVENOUS

## 2024-03-11 MED ORDER — PHENYLEPHRINE 80 MCG/ML (10ML) SYRINGE FOR IV PUSH (FOR BLOOD PRESSURE SUPPORT)
PREFILLED_SYRINGE | INTRAVENOUS | Status: AC
  Filled 2024-03-11: qty 10

## 2024-03-11 MED ORDER — CEFAZOLIN SODIUM 1 G IJ SOLR
INTRAMUSCULAR | Status: AC
  Filled 2024-03-11: qty 20

## 2024-03-11 MED ORDER — PROTAMINE SULFATE 10 MG/ML IV SOLN
INTRAVENOUS | Status: AC
  Filled 2024-03-11: qty 5

## 2024-03-11 MED ORDER — SUGAMMADEX SODIUM 200 MG/2ML IV SOLN
INTRAVENOUS | Status: DC | PRN
  Administered 2024-03-11: 100 mg via INTRAVENOUS

## 2024-03-11 MED ORDER — HEPARIN (PORCINE) 25000 UT/250ML-% IV SOLN
800.0000 [IU]/h | INTRAVENOUS | Status: DC
  Administered 2024-03-11: 800 [IU]/h via INTRAVENOUS
  Filled 2024-03-11: qty 250

## 2024-03-11 MED ORDER — BISACODYL 10 MG RE SUPP: 10.0000 mg | Freq: Every day | RECTAL | Status: DC | PRN

## 2024-03-11 MED ORDER — MORPHINE SULFATE (PF) 2 MG/ML IV SOLN
2.0000 mg | INTRAVENOUS | Status: DC | PRN
  Administered 2024-03-12: 2 mg via INTRAVENOUS
  Filled 2024-03-11: qty 1

## 2024-03-11 MED ORDER — CHLORHEXIDINE GLUCONATE 0.12 % MT SOLN
OROMUCOSAL | Status: AC
  Administered 2024-03-11: 15 mL via OROMUCOSAL
  Filled 2024-03-11: qty 15

## 2024-03-11 MED ORDER — LACTATED RINGERS IV SOLN: INTRAVENOUS | Status: DC

## 2024-03-11 MED ORDER — MIDAZOLAM HCL 2 MG/2ML IJ SOLN: 0.5000 mg | Freq: Once | INTRAMUSCULAR | Status: DC | PRN

## 2024-03-11 MED ORDER — FENTANYL CITRATE (PF) 250 MCG/5ML IJ SOLN
INTRAMUSCULAR | Status: DC | PRN
  Administered 2024-03-11 (×3): 50 ug via INTRAVENOUS
  Administered 2024-03-11: 100 ug via INTRAVENOUS

## 2024-03-11 MED ORDER — ROCURONIUM BROMIDE 10 MG/ML (PF) SYRINGE
PREFILLED_SYRINGE | INTRAVENOUS | Status: DC | PRN
  Administered 2024-03-11: 20 mg via INTRAVENOUS
  Administered 2024-03-11: 60 mg via INTRAVENOUS
  Administered 2024-03-11: 40 mg via INTRAVENOUS

## 2024-03-11 MED ORDER — PROPOFOL 10 MG/ML IV BOLUS
INTRAVENOUS | Status: DC | PRN
  Administered 2024-03-11: 150 mg via INTRAVENOUS

## 2024-03-11 MED ORDER — FENTANYL CITRATE (PF) 100 MCG/2ML IJ SOLN
25.0000 ug | INTRAMUSCULAR | Status: DC | PRN
  Administered 2024-03-11 (×2): 25 ug via INTRAVENOUS

## 2024-03-11 MED ORDER — SODIUM CHLORIDE 0.9 % IV SOLN: 500.0000 mL | Freq: Once | INTRAVENOUS | Status: DC | PRN

## 2024-03-11 MED ORDER — PROTAMINE SULFATE 10 MG/ML IV SOLN
INTRAVENOUS | Status: DC | PRN
Start: 2024-03-11 — End: 2024-03-11
  Administered 2024-03-11: 50 mg via INTRAVENOUS

## 2024-03-11 MED ORDER — HEPARIN 6000 UNIT IRRIGATION SOLUTION
Status: DC | PRN
  Administered 2024-03-11: 2

## 2024-03-11 MED ORDER — ONDANSETRON HCL 4 MG/2ML IJ SOLN
INTRAMUSCULAR | Status: DC | PRN
  Administered 2024-03-11: 4 mg via INTRAVENOUS

## 2024-03-11 MED ORDER — IODIXANOL 320 MG/ML IV SOLN
INTRAVENOUS | Status: DC | PRN
  Administered 2024-03-11: 85 mL via INTRA_ARTERIAL

## 2024-03-11 MED ORDER — OXYCODONE HCL 5 MG PO TABS: 5.0000 mg | ORAL_TABLET | Freq: Once | ORAL | Status: DC | PRN

## 2024-03-11 MED ORDER — ORAL CARE MOUTH RINSE: 15.0000 mL | Freq: Once | OROMUCOSAL | Status: AC

## 2024-03-11 MED ORDER — DEXAMETHASONE SODIUM PHOSPHATE 10 MG/ML IJ SOLN
INTRAMUSCULAR | Status: DC | PRN
  Administered 2024-03-11: 10 mg via INTRAVENOUS

## 2024-03-11 MED ORDER — LIDOCAINE 2% (20 MG/ML) 5 ML SYRINGE
INTRAMUSCULAR | Status: AC
  Filled 2024-03-11: qty 5

## 2024-03-11 MED ORDER — HEPARIN SODIUM (PORCINE) 1000 UNIT/ML IJ SOLN
INTRAMUSCULAR | Status: AC
Start: 2024-03-11 — End: ?
  Filled 2024-03-11: qty 10

## 2024-03-11 MED ORDER — HEPARIN 6000 UNIT IRRIGATION SOLUTION
Status: AC
  Filled 2024-03-11: qty 500

## 2024-03-11 MED ORDER — OXYCODONE HCL 5 MG/5ML PO SOLN: 5.0000 mg | Freq: Once | ORAL | Status: DC | PRN

## 2024-03-11 MED ORDER — EPHEDRINE SULFATE-NACL 50-0.9 MG/10ML-% IV SOSY
PREFILLED_SYRINGE | INTRAVENOUS | Status: DC | PRN
  Administered 2024-03-11 (×2): 5 mg via INTRAVENOUS

## 2024-03-11 MED ORDER — SODIUM CHLORIDE 0.9 % IV SOLN: INTRAVENOUS | Status: DC

## 2024-03-11 MED ORDER — EPHEDRINE 5 MG/ML INJ
INTRAVENOUS | Status: AC
  Filled 2024-03-11: qty 5

## 2024-03-11 SURGICAL SUPPLY — 76 items
BAG COUNTER SPONGE SURGICOUNT (BAG) ×4 IMPLANT
BALLN MUSTANG 4X40X135 (BALLOONS) ×4 IMPLANT
BALLN MUSTANG 6.0X40 135 (BALLOONS) ×4 IMPLANT
BALLOON MUSTANG 4X40X135 (BALLOONS) ×1 IMPLANT
BALLOON MUSTANG 6.0X40 135 (BALLOONS) ×1 IMPLANT
BLADE SURG 10 STRL SS (BLADE) ×1 IMPLANT
CANISTER SUCT 3000ML PPV (MISCELLANEOUS) ×4 IMPLANT
CATH ANGIO 5F BER2 100CM (CATHETERS) ×1 IMPLANT
CATH ANGIO 5F BER2 65CM (CATHETERS) ×1 IMPLANT
CATH CXI 4F 150 DAV (CATHETERS) ×1 IMPLANT
CATH CXI SUPP ANG 4FR 135 (CATHETERS) ×1 IMPLANT
CLAMP SUTURE YELLOW 5 PAIRS (MISCELLANEOUS) ×1 IMPLANT
CLIP TI MEDIUM 6 (CLIP) ×1 IMPLANT
CLIP TI WIDE RED SMALL 6 (CLIP) ×1 IMPLANT
CLOSURE PERCLOSE PROSTYLE (VASCULAR PRODUCTS) ×1 IMPLANT
CNTNR URN SCR LID CUP LEK RST (MISCELLANEOUS) ×1 IMPLANT
COUNTER NDL 20CT MAGNET RED (NEEDLE) ×1 IMPLANT
COVER BACK TABLE 80X110 HD (DRAPES) ×1 IMPLANT
COVER MAYO STAND STRL (DRAPES) ×1 IMPLANT
COVER PROBE W GEL 5X96 (DRAPES) ×4 IMPLANT
DERMABOND ADVANCED .7 DNX12 (GAUZE/BANDAGES/DRESSINGS) ×4 IMPLANT
DERMABOND ADVANCED .7 DNX6 (GAUZE/BANDAGES/DRESSINGS) ×2 IMPLANT
DEVICE ENSNARE 12MMX20MM (VASCULAR PRODUCTS) ×1 IMPLANT
DEVICE RAD COMP TR BAND LRG (VASCULAR PRODUCTS) ×1 IMPLANT
DEVICE TORQUE SEADRAGON GRN (MISCELLANEOUS) ×1 IMPLANT
DRAPE BRACHIAL (DRAPES) ×1 IMPLANT
DRAPE SURG ORHT 6 SPLT 77X108 (DRAPES) ×2 IMPLANT
ELECT REM PT RETURN 9FT ADLT (ELECTROSURGICAL) ×4 IMPLANT
ELECTRODE REM PT RTRN 9FT ADLT (ELECTROSURGICAL) ×4 IMPLANT
GAUZE 4X4 16PLY ~~LOC~~+RFID DBL (SPONGE) ×2 IMPLANT
GLIDESHEATH SLEND SS 6F .021 (SHEATH) ×1 IMPLANT
GLIDEWIRE ADV .035X260CM (WIRE) ×2 IMPLANT
GLOVE BIOGEL PI IND STRL 7.0 (GLOVE) ×4 IMPLANT
GOWN STRL REUS W/ TWL LRG LVL3 (GOWN DISPOSABLE) ×8 IMPLANT
GOWN STRL REUS W/ TWL XL LVL3 (GOWN DISPOSABLE) ×4 IMPLANT
GUIDEWIRE ANGLED .035X260CM (WIRE) ×1 IMPLANT
HEMOSTAT SNOW SURGICEL 2X4 (HEMOSTASIS) ×1 IMPLANT
KIT ENCORE 26 ADVANTAGE (KITS) ×2 IMPLANT
KIT TURNOVER KIT B (KITS) ×4 IMPLANT
LOOP VASCLR MAXI BLUE 18IN ST (MISCELLANEOUS) IMPLANT
LOOP VESSEL MINI RED (MISCELLANEOUS) ×1 IMPLANT
LOOPS VASCLR MAXI BLUE 18IN ST (MISCELLANEOUS) ×4 IMPLANT
NS IRRIG 1000ML POUR BTL (IV SOLUTION) ×4 IMPLANT
PACK ENDO MINOR (CUSTOM PROCEDURE TRAY) ×1 IMPLANT
PAD ARMBOARD POSITIONER FOAM (MISCELLANEOUS) ×8 IMPLANT
PENCIL SMOKE EVACUATOR (MISCELLANEOUS) ×1 IMPLANT
POWDER SURGICEL 3.0 GRAM (HEMOSTASIS) IMPLANT
SET MICROPUNCTURE 5F STIFF (MISCELLANEOUS) ×1 IMPLANT
SHEATH DESTINATION 8F 45CM (SHEATH) ×1 IMPLANT
SHEATH GLIDE SLENDER 4/5FR (SHEATH) ×1 IMPLANT
SHEATH GUIDING 8FX90 (SHEATH) ×1 IMPLANT
SHEATH HIGHFLEX ANSEL 6FRX55 (SHEATH) ×1 IMPLANT
SHEATH PINNACLE 5F 10CM (SHEATH) ×1 IMPLANT
SHEATH PINNACLE 6F 10CM (SHEATH) ×1 IMPLANT
SHEATH PINNACLE 8F 10CM (SHEATH) ×1 IMPLANT
STENT VIABAHN 6X29 6FR 135 (Permanent Stent) ×1 IMPLANT
STENT VIABAHN 9X29 7FR 135 (Permanent Stent) ×1 IMPLANT
SUT MNCRL AB 4-0 PS2 18 (SUTURE) ×1 IMPLANT
SUT PROLENE 6 0 BV (SUTURE) ×1 IMPLANT
SUT SILK 2-0 18XBRD TIE 12 (SUTURE) ×1 IMPLANT
SUT SILK 3-0 18XBRD TIE 12 (SUTURE) ×1 IMPLANT
SUT SILK 4-0 18XBRD TIE 12 (SUTURE) ×1 IMPLANT
SUT VIC AB 3-0 SH 27X BRD (SUTURE) ×1 IMPLANT
SYR 3ML LL SCALE MARK (SYRINGE) ×3 IMPLANT
SYR 5ML LL (SYRINGE) ×1 IMPLANT
TAG SUTURE CLAMP YLW 5PR (MISCELLANEOUS) ×4 IMPLANT
TAPE STRIPS DRAPE STRL (GAUZE/BANDAGES/DRESSINGS) ×1 IMPLANT
TAPE UMBILICAL 1/8X30 (MISCELLANEOUS) ×1 IMPLANT
TIE VASCULAR MAXI BLUE 18IN ST (MISCELLANEOUS) ×4 IMPLANT
TOWEL GREEN STERILE (TOWEL DISPOSABLE) ×4 IMPLANT
TUBE CONNECTING 12X1/4 (SUCTIONS) ×1 IMPLANT
WATER STERILE IRR 1000ML POUR (IV SOLUTION) ×4 IMPLANT
WIRE AMPLATZ SS-J .035X260CM (WIRE) ×2 IMPLANT
WIRE BENTSON .035X145CM (WIRE) ×1 IMPLANT
WIRE HYDRA 450CM (MISCELLANEOUS) ×1 IMPLANT
WIRE ROSEN-J .035X260CM (WIRE) ×1 IMPLANT

## 2024-03-11 NOTE — Progress Notes (Addendum)
 Patient brought to 4E14 from PACU. VSS. Telemetry box applied, CCMD notified. Patient oriented to room and staff. Call bell in reach. Patient educated on importance of bedrest. Patient verbalized understanding.  Patient found with large amount of dried blood behind head on pillow. Pt states her head does not hurt.  Kenard Gower, RN

## 2024-03-11 NOTE — Plan of Care (Signed)

## 2024-03-11 NOTE — Progress Notes (Addendum)
  Progress Note    03/11/2024 6:55 AM Hospital Day 3  Subjective:  says she is ready for the pain in her finger to be gone.   afebrile  Vitals:   03/10/24 2116 03/11/24 0404  BP: 108/71 128/73  Pulse: 66 (!) 55  Resp: 18 16  Temp: 98.7 F (37.1 C) 98.1 F (36.7 C)  SpO2: 97% 96%    Physical Exam: General:  no distress Lungs:  non labored Extremities:  easily palpable bilateral radial and DP pulses.   CBC    Component Value Date/Time   WBC 4.3 03/11/2024 0622   RBC 3.65 (L) 03/11/2024 0622   HGB 11.2 (L) 03/11/2024 0622   HCT 34.1 (L) 03/11/2024 0622   PLT 211 03/11/2024 0622   MCV 93.4 03/11/2024 0622   MCH 30.7 03/11/2024 0622   MCHC 32.8 03/11/2024 0622   RDW 14.1 03/11/2024 0622   LYMPHSABS 0.9 03/10/2024 0535   MONOABS 0.5 03/10/2024 0535   EOSABS 0.2 03/10/2024 0535   BASOSABS 0.0 03/10/2024 0535    BMET    Component Value Date/Time   NA 137 03/10/2024 0535   K 3.6 03/10/2024 0535   CL 103 03/10/2024 0535   CO2 23 03/10/2024 0535   GLUCOSE 92 03/10/2024 0535   BUN 15 03/10/2024 0535   CREATININE 0.72 03/10/2024 0535   CALCIUM 9.3 03/10/2024 0535   GFRNONAA >60 03/10/2024 0535    INR    Component Value Date/Time   INR 1.2 03/07/2024 2153    No intake or output data in the 24 hours ending 03/11/24 0655   Assessment/Plan:  63 y.o. female  with ischemic right index finger   Hospital Day 3  -for OR today with Dr. Hetty Blend for stenting of right subclavian artery.  Discussed with pt that placing stent will help prevent further embolization but  most likely will not help pain in her right index finger.   Continue heparin. -continue npo -may benefit from hand surgery consult   Doreatha Massed, PA-C [facility] [phone] 03/11/2024 6:55 AM  VASCULAR STAFF ADDENDUM: I have independently interviewed and examined the patient. I agree with the above.  Reviewed risks and benefits of RSA intervention including possibility of  stroke She elected to proceed.   Daria Pastures MD [facility] Phone Number: [phone] 03/11/2024 10:36 AM

## 2024-03-11 NOTE — Anesthesia Postprocedure Evaluation (Signed)
 Anesthesia Post Note  Patient: Cniyah Sproull Chilton  Procedure(s) Performed: INSERTION RIGHT SUBCLAVIAN ARTERY AND RIGHT COMMON CAROTID ARTERY STENTS (Right: Neck) ANGIOGRAM, RIGHT UPPER EXTREMITY (Right: Neck) AORTIC ARCH ANGIOGRAM (Neck) ULTRASOUND GUIDANCE, FOR VASCULAR ACCESS, RIGHT RADIAL ARTERY AND RIGHT FEMORAL ARTERY (Right: Groin) DIRECT EXPOSURE OF RIGHT COMMON CAROTID ARTERY (Right: Neck)     Patient location during evaluation: PACU Anesthesia Type: General Level of consciousness: awake and alert, patient cooperative and oriented Pain management: pain level controlled Vital Signs Assessment: post-procedure vital signs reviewed and stable Respiratory status: spontaneous breathing, nonlabored ventilation, respiratory function stable and patient connected to nasal cannula oxygen Cardiovascular status: blood pressure returned to baseline and stable Postop Assessment: no apparent nausea or vomiting Anesthetic complications: no   No notable events documented.  Last Vitals:  Vitals:   03/11/24 1540 03/11/24 1549  BP: 119/69 129/69  Pulse: 70 (!) 58  Resp: 14 14  Temp: 36.4 C 36.7 C  SpO2: 97% 97%    Last Pain:  Vitals:   03/11/24 1549  TempSrc: Oral  PainSc:                  Mafalda Mcginniss,E. Morenike Cuff

## 2024-03-11 NOTE — Anesthesia Preprocedure Evaluation (Addendum)
 Anesthesia Evaluation  Patient identified by MRN, date of birth, ID band Patient awake    Reviewed: Allergy & Precautions, NPO status , Patient's Chart, lab work & pertinent test results  History of Anesthesia Complications Negative for: history of anesthetic complications  Airway Mallampati: II  TM Distance: >3 FB Neck ROM: Full    Dental  (+) Chipped, Dental Advisory Given   Pulmonary Current Smoker and Patient abstained from smoking.   breath sounds clear to auscultation       Cardiovascular hypertension, Pt. on medications (-) angina  Rhythm:Regular Rate:Normal  02/2024 ECHO: EF 60 to 65%.  1. The LV has normal function, no regional wall motion abnormalities. Left ventricular diastolic parameters were normal.   2. RVF is normal. The right ventricular size is normal. There is normal pulmonary artery systolic pressure.   3. The mitral valve is normal in structure. No evidence of mitral valve regurgitation. No evidence of mitral stenosis.   4. The aortic valve is normal in structure. Aortic valve regurgitation is not visualized. No aortic stenosis is present.     Neuro/Psych negative neurological ROS     GI/Hepatic negative GI ROS, Neg liver ROS,,,  Endo/Other  negative endocrine ROS    Renal/GU negative Renal ROS     Musculoskeletal   Abdominal   Peds  Hematology negative hematology ROS (+) Hb 11.2, plt 211k   Anesthesia Other Findings   Reproductive/Obstetrics                             Anesthesia Physical Anesthesia Plan  ASA: 2  Anesthesia Plan: General   Post-op Pain Management: Tylenol PO (pre-op)* and Minimal or no pain anticipated   Induction: Intravenous  PONV Risk Score and Plan: 2 and Ondansetron and Dexamethasone  Airway Management Planned: Oral ETT  Additional Equipment: None  Intra-op Plan:   Post-operative Plan: Extubation in OR  Informed Consent: I have  reviewed the patients History and Physical, chart, labs and discussed the procedure including the risks, benefits and alternatives for the proposed anesthesia with the patient or authorized representative who has indicated his/her understanding and acceptance.     Dental advisory given  Plan Discussed with: CRNA and Surgeon  Anesthesia Plan Comments:        Anesthesia Quick Evaluation

## 2024-03-11 NOTE — Plan of Care (Signed)

## 2024-03-11 NOTE — Progress Notes (Signed)
 PHARMACY - ANTICOAGULATION CONSULT NOTE  Pharmacy Consult for heparin Indication: pulmonary embolus  No Known Allergies  Patient Measurements: Height: 5\' 9"  (175.3 cm) Weight: 49 kg (108 lb) IBW/kg (Calculated) : 66.2 HEPARIN DW (KG): 49  Vital Signs: Temp: 98.1 F (36.7 C) (04/03 0404) Temp Source: Oral (04/03 0404) BP: 128/73 (04/03 0404) Pulse Rate: 55 (04/03 0404)  Labs: Recent Labs    03/09/24 0618 03/10/24 0535 03/10/24 1235 03/11/24 0622  HGB 10.9* 11.0*  --  11.2*  HCT 33.0* 33.8*  --  34.1*  PLT 218 218  --  211  HEPARINUNFRC  --  0.96* 0.35 0.20*  CREATININE 0.86 0.72  --  0.80    Estimated Creatinine Clearance: 57.1 mL/min (by C-G formula based on SCr of 0.8 mg/dL).   Assessment: 24 yoF [facility] with digital ischemia for 5 days with CTA showing acute to subacute PE with no evidence of RHS and evidence of thrombus of the R subclavian artery. Pharmacy consulted to dose heparin.  Heparin level now subtherapeutic.  Confirmed with RN that there hasn't been any issue with the heparin infusion, pump nor IV site.  No bleeding reported.  Initial heparin level of 0.96 units/mL could reflect heparin bolus post SQ Lovenox dose.  Goal of Therapy:  Heparin level 0.3-0.7 units/ml Monitor platelets by anticoagulation protocol: Yes   Plan:  Increase heparin gtt back to 800 units/hr Check 6 hr heparin level Daily heparin level and CBC F/u post stenting  Mae Denunzio D. Laney Potash, PharmD, BCPS, BCCCP 03/11/2024, 7:12 AM

## 2024-03-11 NOTE — Telephone Encounter (Signed)
 Let's see her in about 1 month so she can be treated for her PE.

## 2024-03-11 NOTE — OR Nursing (Signed)
 TR band applied to right wrist post operatively by DR. Hetty Blend. Band inflated to 12ml.

## 2024-03-11 NOTE — Transfer of Care (Signed)
 Immediate Anesthesia Transfer of Care Note  Patient: Kristina Howe  Procedure(s) Performed: INSERTION RIGHT SUBCLAVIAN ARTERY AND RIGHT COMMON CAROTID ARTERY STENTS (Right: Neck) ANGIOGRAM, RIGHT UPPER EXTREMITY (Right: Neck) AORTIC ARCH ANGIOGRAM (Neck) ULTRASOUND GUIDANCE, FOR VASCULAR ACCESS, RIGHT RADIAL ARTERY AND RIGHT FEMORAL ARTERY (Right: Groin) DIRECT EXPOSURE OF RIGHT COMMON CAROTID ARTERY (Right: Neck)  Patient Location: PACU  Anesthesia Type:General  Level of Consciousness: awake, alert , and oriented  Airway & Oxygen Therapy: Patient Spontanous Breathing  Post-op Assessment: Report given to RN, Post -op Vital signs reviewed and stable, Patient moving all extremities, and Patient able to stick tongue midline  Post vital signs: Reviewed and stable  Last Vitals:  Vitals Value Taken Time  BP 124/74 03/11/24 1445  Temp    Pulse 81 03/11/24 1448  Resp 17 03/11/24 1448  SpO2 98 % 03/11/24 1448  Vitals shown include unfiled device data.  Last Pain:  Vitals:   03/11/24 1022  TempSrc:   PainSc: 8       Patients Stated Pain Goal: 2 (03/07/24 1733)  Complications: No notable events documented.

## 2024-03-11 NOTE — Progress Notes (Signed)
 PROGRESS NOTE    Kristina Howe  OZH:[id number] DOB: [birth year] DOA: 03/07/2024 PCP: Blair Heys, MD (Inactive)   Brief Narrative:  Kristina Howe is a 62 y.o. female with hx of HTN, HLD, anxiety, current smoker, who was transferred from [facility] ED for digital ischemia X 5 days. Pt noted aching in her right 2nd through 4th fingers and then the fingers became progressively blue discolored and cold.  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  She denies any numbness in the affected fingers.  Does have weakness with flexion especially at the right second digit/index finger.  No history of similar episodes in the past.  No other affected limbs.  No prior history of clots or clots in the family.  Denies any history of Raynaud's. No known history of autoimmune disease.  No IV drug use.   Assessment & Plan:   Principal Problem:   Ischemic finger  Digital ischemia of right 2nd through 4th digits Acute versus subacute pulmonary emboli, POA Right subclavian artery high-grade stenosis, POA -Imaging overnight confirmed 90% stenosis of the right subclavian artery with free-floating thrombus as well as acute to subacute PE -Heparin drip initiated overnight - continue in the interim -Vascular surgery following, tentative plan for intervention this afternoon **Update: Patient tolerated balloon angioplasty and distal embolization with vascular surgery, monitor overnight with tentative plan for discharge next 24 hours pending recovery   Incidental findings on imaging Lung nodules right upper lobe up to 7 mm (imaged on CTA of right upper extremity) IR asked to evaluate for possible biopsy- unfortunately there are no avenues for biopsy Recommendation for pulmonology evaluation and bronch/biopsy of the mediastinum - discussed with pulm patient will need 2 weeks of anticoagulation before bronch can be scheduled. Pulm office  notified and will follow up with patient after discharge.  Hypertension Within normal limits on nifedipine only Hold HCTZ/triamterene/valsartan   Hyperlipidemia LDL 105 Continue statins   Anxiety Xanax nightly as needed   Tobacco abuse Rule out emphysema Discussed smoking cessation Current smoker 1/2 pack/day - patient wants to quit based on above Imaging consistent with emphysema but remains asymptomatic  DVT prophylaxis: SCD's Start: 03/11/24 1549Heparin drip Code Status:   Code Status: Full Code Family Communication: None present  Status is: Inpatient  Dispo: The patient is from: Home              Anticipated d/c is to: Home              Anticipated d/c date is: 48 to 72 hours              Patient currently not medically stable for discharge  Consultants:  Vascular surgery, IR, pulmonology  Procedures:  None, potential intervention 03/11/24 with vascular surgery  Antimicrobials:  None  Subjective: No acute issues or events overnight, reports hand is still somewhat painful but improved on current regimen.  Otherwise denies nausea vomiting diarrhea constipation headache fevers chills or shortness of breath  Objective: Vitals:   03/11/24 1530 03/11/24 1538 03/11/24 1540 03/11/24 1549  BP: 122/65 119/69 119/69 129/69  Pulse:   70 (!) 58  Resp: 11 12 14 14   Temp:   97.6 F (36.4 C) 98 F (36.7 C)  TempSrc:    Oral  SpO2:   97% 97%  Weight:      Height:        Intake/Output Summary (  Last 24 hours) at 03/11/2024 1844 Last data filed at 03/11/2024 1756 Gross per 24 hour  Intake 1257.5 ml  Output 650 ml  Net 607.5 ml   Filed Weights   03/07/24 1206 03/11/24 1011  Weight: 49 kg 50.8 kg    Examination:  General:  Pleasantly resting in bed, No acute distress. HEENT:  Normocephalic atraumatic.  Sclerae nonicteric, noninjected.  Extraocular movements intact bilaterally. Neck:  Without mass or deformity.  Trachea is midline. Lungs:  Clear to auscultate  bilaterally without rhonchi, wheeze, or rales. Heart:  Regular rate and rhythm.  Without murmurs, rubs, or gallops. Abdomen:  Soft, nontender, nondistended.  Without guarding or rebound. Extremities: Palpable pulses bilaterally, right index finger and middle finger cool to touch, dusky Skin:  Warm and dry, no erythema.   Data Reviewed: I have personally reviewed following labs and imaging studies  CBC: Recent Labs  Lab 03/07/24 1434 03/08/24 0754 03/09/24 0618 03/10/24 0535 03/11/24 0622  WBC 6.5 6.2 4.7 4.7 4.3  NEUTROABS 4.9  --  3.1 3.0  --   HGB 11.7* 11.3* 10.9* 11.0* 11.2*  HCT 35.7* 34.8* 33.0* 33.8* 34.1*  MCV 93.5 93.0 93.2 93.4 93.4  PLT 241 253 218 218 211   Basic Metabolic Panel: Recent Labs  Lab 03/07/24 1434 03/08/24 0754 03/09/24 0618 03/10/24 0535 03/11/24 0622  NA 139 137 139 137 138  K 3.5 3.6 3.7 3.6 4.0  CL 105 102 100 103 104  CO2 23 22 26 23 25   GLUCOSE 81 79 93 92 89  BUN 29* 21 16 15 19   CREATININE 0.71 0.77 0.86 0.72 0.80  CALCIUM 9.7 9.7 9.7 9.3 9.7  MG  --  1.9  --   --   --   PHOS  --  2.6  --   --   --    GFR: Estimated Creatinine Clearance: 59.2 mL/min (by C-G formula based on SCr of 0.8 mg/dL). Liver Function Tests: Recent Labs  Lab 03/07/24 1434  AST 19  ALT 11  ALKPHOS 64  BILITOT 0.3  PROT 7.0  ALBUMIN 4.1   Coagulation Profile: Recent Labs  Lab 03/07/24 2153  INR 1.2   HbA1C: No results for input(s): "HGBA1C" in the last 72 hours.  Lipid Profile: No results for input(s): "CHOL", "HDL", "LDLCALC", "TRIG", "CHOLHDL", "LDLDIRECT" in the last 72 hours.    Recent Results (from the past 240 hours)  Blood culture (routine x 2)     Status: None (Preliminary result)   Collection Time: 03/07/24  2:33 PM   Specimen: BLOOD  Result Value Ref Range Status   Specimen Description   Final    BLOOD BLOOD LEFT FOREARM Performed at [facility], [address] [place], [place] [address]    Special  Requests   Final    BOTTLES DRAWN AEROBIC AND ANAEROBIC Blood Culture adequate volume Performed at [facility], [address] [place], [place] [address]    Culture   Final    NO GROWTH 4 DAYS Performed at [facility], [address] [place], [place] [address]    Report Status PENDING  Incomplete  Blood culture (routine x 2)     Status: None (Preliminary result)   Collection Time: 03/07/24  2:46 PM   Specimen: BLOOD  Result Value Ref Range Status   Specimen Description   Final    BLOOD RIGHT ANTECUBITAL Performed at [facility], [address], [place], [place] [address]  Special Requests   Final    BOTTLES DRAWN AEROBIC AND ANAEROBIC Blood Culture adequate volume Performed at [facility], [address] [place], [place] [address]    Culture   Final    NO GROWTH 4 DAYS Performed at [facility], [address], [place], [place] [address]    Report Status PENDING  Incomplete  MRSA Next Gen by PCR, Nasal     Status: None   Collection Time: 03/10/24 10:09 PM   Specimen: Nasal Mucosa; Nasal Swab  Result Value Ref Range Status   MRSA by PCR Next Gen NOT DETECTED NOT DETECTED Final    Comment: (NOTE) The GeneXpert MRSA Assay (FDA approved for NASAL specimens only), is one component of a comprehensive MRSA colonization surveillance program. It is not intended to diagnose MRSA infection nor to guide or monitor treatment for MRSA infections. Test performance is not FDA approved in patients less than 74 years old. Performed at [facility], [address] [place], [place] [address]          Radiology Studies: HYBRID OR IMAGING [facility] ONLY) Result Date: 03/11/2024 There is no interpretation for this exam.  This order is for images obtained during a surgical procedure.  Please See "Surgeries" Tab for more information regarding the procedure.   CT ANGIO CHEST AORTA W/CM & OR WO/CM Result Date:  03/09/2024 CLINICAL DATA:  Upper extremity arterial embolization. EXAM: CT ANGIOGRAPHY CHEST WITH CONTRAST TECHNIQUE: Multidetector CT imaging of the chest was performed using the standard protocol during bolus administration of intravenous contrast. Multiplanar CT image reconstructions and MIPs were obtained to evaluate the vascular anatomy. RADIATION DOSE REDUCTION: This exam was performed according to the departmental dose-optimization program which includes automated exposure control, adjustment of the mA and/or kV according to patient size and/or use of iterative reconstruction technique. CONTRAST:  75mL OMNIPAQUE IOHEXOL 350 MG/ML SOLN COMPARISON:  None Available. FINDINGS: Cardiovascular: There is preferential opacification of the pulmonary arterial tree opacification of the systemic arterial structures is adequate. There are multiple branching intraluminal filling defects identified, some of which appears eccentric, within the segmental pulmonary arteries of the lower lobes bilaterally in keeping with acute to subacute pulmonary emboli. The embolic burden is small. The central pulmonary arteries are enlarged in keeping with changes of pulmonary arterial hypertension. There is no CT evidence of right heart strain. Mild coronary artery calcification. Global cardiac size is within normal limits. Possible atrial septal defect passage of contrast across the interatrial septum at axial image # 93/10. No pericardial effusion. The thoracic aorta is of normal caliber. Mild atherosclerotic calcification within the thoracic aorta. 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 There is, however, bulky calcified irregular atherosclerotic plaque at the origin of the right subclavian artery which results in a 85-95% stenosis of the right subclavian artery at its origin. There is a small amount of associated free-floating intraluminal thrombus with  this plaque best seen on image # 21/10, 79/13 and 77/14. Mediastinum/Nodes: There is pathologic right paratracheal and subcarinal adenopathy with the index lymph node measuring 3.8 x 4.0 cm at axial image # 30/10. This likely represents nodal metastatic disease unknown primary a or a lymphoproliferative process such as lymphoma. Visualized thyroid is unremarkable. The esophagus demonstrates circumferential wall thickening in its mid segment, not well characterized on this examination, possibly related to inflammatory process such as esophagitis, or infiltrative disease. Lungs/Pleura: There is moderate emphysema  with marked pulmonary hyperinflation. A spiculated nodule is seen within the right lower lobe measuring 9 mm in mean diameter at axial image # 95/11, possibly representing a primary lesion as be seen in the setting small cell lung cancer. 5 mm indeterminate pulmonary nodule is seen within the right upper lobe, axial image # 71/11. A elongate peribronchial nodule is seen within the left upper lobe with a mean diameter 15 mm, indeterminate but also suspicious for a possible synchronous primary bronchogenic neoplasm. More focal consolidation within the subpleural left lower lobe is in keeping with a subacute pulmonary infarct. No pneumothorax or pleural effusion. Upper Abdomen: No acute abnormality. Musculoskeletal: No acute bone abnormality. No suspicious lytic or blastic bone lesion. Remote superior endplate fracture of L1 with mild loss of height anteriorly. Review of the MIP images confirms the above findings. IMPRESSION: Complex examination. Acute to subacute pulmonary emboli with small embolic burden. No CT evidence of right heart strain. Left lower lobe subsegmental pulmonary infarct. Suspected atrial septal defect. Given the above findings, a paradoxical embolus is possible, but considered less likely within the right upper extremity given the high-grade stenosis described below the origin of the  subclavian artery. 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 Associated free-floating thrombus with this plaque noted and is favored to represent embolic source for the reported right upper extremity peripheral embolic disease. Pathologic mediastinal adenopathy, favored to represent nodal metastatic disease given the associated findings within the right lower lobe and left upper lobe. PET CT examination may be helpful for complete staging and to direct biopsy strategy. Multiple pulmonary nodules, indeterminate, but suspicious for synchronous primary bronchogenic neoplasm within the right lower lobe and left upper lobe. This could be further assessed with PET CT examination. Moderate emphysema Aortic Atherosclerosis (ICD10-I70.0) and Emphysema (ICD10-J43.9). Electronically Signed   By: Helyn Numbers M.D.   On: 03/09/2024 20:57        Scheduled Meds:  [START ON 03/12/2024] aspirin EC  81 mg Oral Q0600   atorvastatin  40 mg Oral Daily   [START ON 03/12/2024] docusate sodium  100 mg Oral Daily   fentaNYL       NIFEdipine  30 mg Oral Daily   nitroGLYCERIN  0.75 inch Topical Q6H   pantoprazole  40 mg Oral Daily   Continuous Infusions:  sodium chloride 50 mL/hr at 03/11/24 1747   sodium chloride      ceFAZolin (ANCEF) IV     heparin       LOS: 3 days   Time spent:  Azucena Fallen, DO [facility]  If 7PM-7AM, please contact night-coverage [email or web address]  03/11/2024, 6:44 PM

## 2024-03-11 NOTE — Op Note (Signed)
 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  The assistant played a critical role in pinning wires while and tracking the stent graft devices and advancing the aortic balloon. These skills could not have been adequately performed by a scrub tech assistant.    ANESTHESIA: general  ESTIMATED BLOOD LOSS: 200 cc  FINDING(S): Severe distal innominate/proximal subclavian artery stenosis Occluded right vertebral artery on initial angiograms  Postintervention, widely patent innominate with brisk flow through both the subclavian and carotid stents with brisk flow into the ICA and ECA.  Widely patent brachial, radial and ulnar arteries down to the wrist, high takeoff of the radial artery.  Patent right vertebral after stent placement.  SPECIMEN(S): None  INDICATIONS:   Kristina Howe is a 63 y.o. female who was admitted with dusky painful right index finger.  CT angiograms were obtained which demonstrated a severe stenosis of the innominate bifurcation with associated thrombus.  This was determined to be an embolizing lesion given her significant right index finger ischemia.  Of note her CT scan also demonstrated a mediastinal  mass which she will be undergoing further workup.  Angiogram with stent placement was offered given the appearance of the lesion and distal embolization, risk and benefits were reviewed.  We specifically reviewed the risk of stroke.  She expressed understanding and elected to proceed.  DESCRIPTION: The patient was brought to the operating room and positioned supine operating room table.  General anesthesia was induced and the bilateral groins and right lower arm were prepped and draped in the usual sterile fashion.  Preoperative antibiotics were administered and a timeout was performed. The right common femoral artery was accessed under ultrasound guidance.  A Bentson wire was then placed into the distal thoracic aorta and this access was upsized to an 8 [place] sheath.  The right radial artery was then accessed under ultrasound guidance and over the microwire a a 5 French slender sheath was placed.  200 mcg of nitro was then administered via the sheath and into the right radial artery.  The patient was systemically heparinized for an ACT greater than 250 and a glide advantage wire and CXI catheter were then used to traverse retrograde up the right upper extremity and into the ascending aorta.  Over the Bentson wire in the right groin a snare catheter was placed into the ascending aorta.  The glide advantage wire was then snared from the groin access and pulled through the right common femoral artery, effectively obtaining body floss.  An 8 [place] by 90 cm sheath was then placed via the right groin access into the innominate artery.  Angiogram was then obtained which demonstrated a severe stenosis with the proximal aspect extending down into the innominate artery.  Myself my partner Dr. Lenell Antu both agreed that in order to fully treat  this lesion we would need to have the proximal stent edge into the innominate artery which would mean we would need kissing stents into the right subclavian right common carotid  artery.  Due to her short stature and the length of the VBX catheters we needed to exchange the 8 [place] by 90 cm sheath and unfortunately did not have an 8 [place] by 60 cm only an 8 [place] by 45 cm.  This sheath was placed through the right groin access but only made it to the arch of the aorta.  Using a barrier to catheter and a Glidewire as a buddy system to the through and through access we were able to cannulate the right common carotid artery and this Glidewire was then exchanged for a Rosen wire via a CXI catheter.  A 9 mm x 29 mm VBX stent was then placed over the through and through wire and deployed from the distal innominate into the subclavian artery with care to land short of the vertebral ostial calcium although it should be noted that we did not see the vertebral filling on the initial angiograms.  This stent was deployed adequately effective in treating the entire stenosis but also partially covering the right common carotid artery, which was already cannulated with a Rosen wire.  We then attempted to place a 6 mm x 29 VBX stent over the Rosen wire and into the left common carotid as a kissing stent although there was difficulty tracking past the 9 mm stent edge.  Given that the right common carotid was covered we are having difficulty tracking the stent, I then asked anesthesia to please keep her blood pressure slightly up with a map goal of 90 for the remainder of the case.  We exchanged the Rosen wire for a J-tip Amplatz with a CXI catheter thinking that this would give [place] more support although still the stent would not track.  We then lost wire access into the right common carotid and exchanged the 8 [place] by 45 cm sheath for the 8 [place] by 90 cm sheath and placed this into the innominate artery.  We were able to get a wire beside the 9 mm stent and into the right common carotid although even with his sheath in place in the innominate and an Amplatz J-wire in the right common carotid which are  still unable to track the 6 mm stent into the right common carotid.  The right neck was prepped and draped in the usual sterile fashion and a transverse incision was made overlying the split of the sternocleidomastoid muscle.  This incision was carried deeper with electrocautery and the avascular plane between the 2 heads of the SCM were identified.  Dissection was carried deeper and the internal jugular vein was identified and mobilized laterally.  The right common carotid artery was then identified and dissected free for an approximate 2 cm length.  A U-stitch was then placed in the anterior wall of the artery and using a micropuncture kit the right common artery was accessed in a retrograde direction.  Microwire and micro sheath were easily passed into the ascending aorta this accessed was upsized to a short 43F sheath over a glide advantage wire. The 6mm x1mm VBX stent was then deployed via this direct R CCA access with care to match up the stent edges in the innominate artery. A 6mm Mustang balloon was advanced from the right groin access and placed into the 9mm stent to the subclavian artery and a  simultaneous inflation was performed in both stents.  Both balloons were removed, the the wire and 6 [place] sheaths were removed from the right carotid artery and the previously placed Prolene U-stitch was tied down with excellent hemostasis.  An angiogram from the sheath and the innominate artery demonstrated excellent placement of the stents with wide patency of the carotid and subclavian arteries no obvious injuries or dissections and wide patency of the carotid bifurcation.  Interestingly there was also flow into the right vertebral artery which was not seen on the initial angiograms.  A completion right upper extremity angiogram was then obtained which demonstrated wide patency of the brachial, radial and ulnar arteries with brisk flow down to the wrist.  There was a high radial bifurcation.  The sheath was  removed from the right common femoral access and a Pro-glide was then deployed with excellent hemostasis.  The carotid exposure incision was copiously irrigated and hemostasis was achieved with electrocautery.  This was then closed in layers with 3-0 Vicryl for the platysma and 4 Monocryl for the skin.  A TR band was then placed for hemostasis of the right radial access.  All counts were correct at the end of the procedure. The patient tolerated the procedure well and was brought to recovery in stable condition. In recovery she is responding appropriately and moving all 4 extremities spontaneously.    COMPLICATIONS: none apparent  CONDITION: stable  Daria Pastures MD [facility] Phone Number: [phone] 03/11/2024 2:24 PM

## 2024-03-11 NOTE — Anesthesia Procedure Notes (Addendum)
 Procedure Name: Intubation Date/Time: 03/11/2024 11:18 AM  Performed by: Billey Chang, RNPre-anesthesia Checklist: Patient identified, Emergency Drugs available, Suction available and Patient being monitored Patient Re-evaluated:Patient Re-evaluated prior to induction Oxygen Delivery Method: Circle system utilized Preoxygenation: Pre-oxygenation with 100% oxygen Induction Type: IV induction Ventilation: Mask ventilation without difficulty Laryngoscope Size: Miller and 2 Grade View: Grade I Tube type: Oral Tube size: 7.0 mm Number of attempts: 1 Airway Equipment and Method: Stylet and Oral airway Placement Confirmation: ETT inserted through vocal cords under direct vision, positive ETCO2 and breath sounds checked- equal and bilateral Secured at: 21 cm Tube secured with: Tape Dental Injury: Teeth and Oropharynx as per pre-operative assessment  Comments: Lip cut during intubation. Saline soaked gauze to area.

## 2024-03-12 ENCOUNTER — Telehealth (HOSPITAL_COMMUNITY): Payer: Self-pay

## 2024-03-12 ENCOUNTER — Encounter (HOSPITAL_COMMUNITY): Payer: Self-pay | Admitting: Vascular Surgery

## 2024-03-12 ENCOUNTER — Other Ambulatory Visit (HOSPITAL_COMMUNITY): Payer: Self-pay

## 2024-03-12 ENCOUNTER — Other Ambulatory Visit: Payer: Self-pay

## 2024-03-12 DIAGNOSIS — D62 Acute posthemorrhagic anemia: Secondary | ICD-10-CM

## 2024-03-12 DIAGNOSIS — I998 Other disorder of circulatory system: Secondary | ICD-10-CM | POA: Diagnosis not present

## 2024-03-12 DIAGNOSIS — Z95828 Presence of other vascular implants and grafts: Secondary | ICD-10-CM

## 2024-03-12 DIAGNOSIS — I771 Stricture of artery: Secondary | ICD-10-CM

## 2024-03-12 DIAGNOSIS — Z9889 Other specified postprocedural states: Secondary | ICD-10-CM

## 2024-03-12 LAB — HEPARIN LEVEL (UNFRACTIONATED): Heparin Unfractionated: 0.3 [IU]/mL (ref 0.30–0.70)

## 2024-03-12 LAB — BASIC METABOLIC PANEL WITH GFR
Anion gap: 9 (ref 5–15)
BUN: 18 mg/dL (ref 8–23)
CO2: 23 mmol/L (ref 22–32)
Calcium: 9 mg/dL (ref 8.9–10.3)
Chloride: 105 mmol/L (ref 98–111)
Creatinine, Ser: 0.92 mg/dL (ref 0.44–1.00)
GFR, Estimated: >60 mL/min (ref >60)
Glucose, Bld: 112 mg/dL — ABNORMAL HIGH (ref 70–99)
Potassium: 4.4 mmol/L (ref 3.5–5.1)
Sodium: 137 mmol/L (ref 135–145)

## 2024-03-12 LAB — CBC
HCT: 24.8 % — ABNORMAL LOW (ref 36.0–46.0)
Hemoglobin: 8.1 g/dL — ABNORMAL LOW (ref 12.0–15.0)
MCH: 31 pg (ref 26.0–34.0)
MCHC: 32.7 g/dL (ref 30.0–36.0)
MCV: 95 fL (ref 80.0–100.0)
Platelets: 187 10*3/uL (ref 150–400)
RBC: 2.61 MIL/uL — ABNORMAL LOW (ref 3.87–5.11)
RDW: 14.2 % (ref 11.5–15.5)
WBC: 6.4 10*3/uL (ref 4.0–10.5)
nRBC: 0 % (ref 0.0–0.2)

## 2024-03-12 LAB — CRYOGLOBULIN

## 2024-03-12 LAB — CULTURE, BLOOD (ROUTINE X 2)
Culture: NO GROWTH
Culture: NO GROWTH
Special Requests: ADEQUATE
Special Requests: ADEQUATE

## 2024-03-12 MED ORDER — ATORVASTATIN CALCIUM 40 MG PO TABS
40.0000 mg | ORAL_TABLET | Freq: Every day | ORAL | 0 refills | Status: DC
  Filled 2024-03-12: qty 30, 30d supply, fill #0

## 2024-03-12 MED ORDER — NIFEDIPINE ER 30 MG PO TB24
30.0000 mg | ORAL_TABLET | Freq: Every day | ORAL | 0 refills | Status: DC
  Filled 2024-03-12: qty 30, 30d supply, fill #0

## 2024-03-12 MED ORDER — APIXABAN (ELIQUIS) VTE STARTER PACK (10MG AND 5MG)
ORAL_TABLET | ORAL | 0 refills | Status: DC
  Filled 2024-03-12: qty 74, 28d supply, fill #0

## 2024-03-12 MED ORDER — OXYCODONE-ACETAMINOPHEN 5-325 MG PO TABS
1.0000 | ORAL_TABLET | ORAL | 0 refills | Status: AC | PRN
  Filled 2024-03-12: qty 30, 3d supply, fill #0

## 2024-03-12 NOTE — Progress Notes (Signed)
  Progress Note    03/12/2024 6:28 AM 1 Day Post-Op  Subjective:  says her finger is excruciating pain and getting worse.  afebrile  Vitals:   03/11/24 2346 03/12/24 0400  BP: 112/60 115/66  Pulse: 73 66  Resp: 13 13  Temp: 98.1 F (36.7 C) 98.4 F (36.9 C)  SpO2: 97% 99%    Physical Exam: General:  having pain in her finger; tearful  Cardiac:  regular Lungs:  non labored Incisions:  right neck is clean without hematoma; right radial and right groin without hematoma Extremities:  palpable bilateral radial and DP pulses   CBC    Component Value Date/Time   WBC 6.4 03/12/2024 0257   RBC 2.61 (L) 03/12/2024 0257   HGB 8.1 (L) 03/12/2024 0257   HCT 24.8 (L) 03/12/2024 0257   PLT 187 03/12/2024 0257   MCV 95.0 03/12/2024 0257   MCH 31.0 03/12/2024 0257   MCHC 32.7 03/12/2024 0257   RDW 14.2 03/12/2024 0257   LYMPHSABS 0.9 03/10/2024 0535   MONOABS 0.5 03/10/2024 0535   EOSABS 0.2 03/10/2024 0535   BASOSABS 0.0 03/10/2024 0535    BMET    Component Value Date/Time   NA 137 03/12/2024 0257   K 4.4 03/12/2024 0257   CL 105 03/12/2024 0257   CO2 23 03/12/2024 0257   GLUCOSE 112 (H) 03/12/2024 0257   BUN 18 03/12/2024 0257   CREATININE 0.92 03/12/2024 0257   CALCIUM 9.0 03/12/2024 0257   GFRNONAA >60 03/12/2024 0257    INR    Component Value Date/Time   INR 1.2 03/07/2024 2153     Intake/Output Summary (Last 24 hours) at 03/12/2024 1610 Last data filed at 03/11/2024 1756 Gross per 24 hour  Intake 1257.5 ml  Output 650 ml  Net 607.5 ml      Assessment/Plan:  63 y.o. female is s/p:  Ultrasound-guided access of right radial artery Ultrasound-guided access of right common femoral artery Aortogram and right upper extremity angiogram Balloon angioplasty and stenting of the right subclavian artery Balloon angioplasty and stenting of the right carotid artery Exposure and direct repair of the right common carotid artery Pro-glide closure of right common  femoral artery TR band application to the right radial artery  1 Day Post-Op   -pt with palpable bilateral radial and DP pulses -blood on pillow-apparently, had blood matted in hair and it is from that.  No active bleeding and incision clean and dry without hematoma. -renal function normal -acute blood loss anemia-pt tolerating.  -continues to have excruciating pain in right index finger.  Recommend ortho consult for possible amputation right index finger.  -DVT prophylaxis:  heparin gtt until evaluated by ortho.  Continue asa.  At discharge, she will be on asa and DOAC.  No plavix.     Doreatha Massed, PA-C [facility] [phone] 03/12/2024 6:29 AM

## 2024-03-12 NOTE — Discharge Summary (Signed)
 Physician Discharge Summary  Kristina Howe NWG:[id number] DOB: 09-Nov-1961 DOA: 03/07/2024  PCP: Blair Heys, MD (Inactive)  Admit date: 03/07/2024 Discharge date: 03/12/2024  Admitted From: Home Disposition: Home  Recommendations for Outpatient Follow-up:  Follow up with PCP in 1-2 weeks Follow-up with surgery in the next 1 to 2 weeks as scheduled Follow-up with pulmonology in 2 to 4 weeks as scheduled  Home Health: None Equipment/Devices: None  Discharge Condition: Stable CODE STATUS: Full Diet recommendation: Low-salt low-fat low-carb diet  Brief/Interim Summary: Kristina Howe is a 63 y.o. female with hx of HTN, HLD, anxiety, current smoker, who was transferred from [facility] ED for digital ischemia X 5 days. Pt noted aching in her right 2nd through 4th fingers and then the fingers became progressively blue discolored and cold.  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  She denies any numbness in the affected fingers.  Does have weakness with flexion especially at the right second digit/index finger.  No history of similar episodes in the past.  No other affected limbs.  No prior history of clots or clots in the family.  Denies any history of Raynaud's. No known history of autoimmune disease.  No IV drug use.   Patient admitted as above with worsening pain in the right hand, status post evaluation by vascular surgery requiring balloon angioplasty and embolization as well as initiated on heparin drip for pulmonary embolism as well as distal embolus in the right hand.  She tolerated procedure quite well, 2 of her 3 fingers are "back to baseline" per herself but notes her first finger remains somewhat painful and difficult to flex.    Discharge Diagnoses:  Principal Problem:   Ischemic finger  Digital ischemia of right 2nd through 4th digits Acute versus subacute pulmonary emboli, POA Right subclavian  artery high-grade stenosis, POA -Imaging overnight confirmed 90% stenosis of the right subclavian artery with free-floating thrombus as well as acute to subacute PE -Heparin drip initiated overnight - continue in the interim -Vascular surgery following Patient tolerated balloon angioplasty and distal embolization   Incidental findings on imaging Lung nodules right upper lobe up to 7 mm (imaged on CTA of right upper extremity) IR asked to evaluate for possible biopsy- unfortunately there are no avenues for biopsy Recommendation for pulmonology evaluation and bronch/biopsy of the mediastinum - discussed with pulm patient will need 2 weeks of anticoagulation minimum before bronch can be scheduled. Pulm office notified and will follow up with patient after discharge.   Hypertension Within normal limits - resume home meds   Hyperlipidemia LDL 105 Continue statins   Anxiety Xanax nightly as needed   Tobacco abuse Rule out emphysema Discussed smoking cessation Current smoker 1/2 pack/day - patient wants to quit based on above Imaging consistent with emphysema but remains asymptomatic  Discharge Instructions   Allergies as of 03/12/2024   No Known Allergies      Medication List     STOP taking these medications    sucralfate 1 g tablet Commonly known as: CARAFATE       TAKE these medications    atorvastatin 40 MG tablet Commonly known as: LIPITOR Take 1 tablet (40 mg total) by mouth daily. What changed:  medication strength how much to take   Eliquis DVT/PE Starter Pack Generic drug: Apixaban Starter Pack (10mg  and 5mg ) Take as directed on package: start with two-5mg  tablets twice daily  for 7 days. On day 8, switch to one-5mg  tablet twice daily.   NIFEdipine 30 MG 24 hr tablet Commonly known as: ADALAT CC Take 1 tablet (30 mg total) by mouth daily.   omega-3 acid ethyl esters 1 g capsule Commonly known as: LOVAZA Take 2 g by mouth daily.    oxyCODONE-acetaminophen 5-325 MG tablet Commonly known as: PERCOCET/ROXICET Take 1-2 tablets by mouth every 4 (four) hours as needed for up to 3 days for moderate pain (pain score 4-6).   tiZANidine 2 MG tablet Commonly known as: ZANAFLEX Take 2 mg by mouth every 6 (six) hours as needed.   triamterene-hydrochlorothiazide 37.5-25 MG tablet Commonly known as: MAXZIDE-25 Take 1 tablet by mouth daily.   valsartan 320 MG tablet Commonly known as: DIOVAN Take 320 mg by mouth daily.        No Known Allergies  Consultations: Vascular surgery   Procedures/Studies: HYBRID OR IMAGING ([facility] ONLY) Result Date: 03/11/2024 There is no interpretation for this exam.  This order is for images obtained during a surgical procedure.  Please See "Surgeries" Tab for more information regarding the procedure.   CT ANGIO CHEST AORTA W/CM & OR WO/CM Result Date: 03/09/2024 CLINICAL DATA:  Upper extremity arterial embolization. EXAM: CT ANGIOGRAPHY CHEST WITH CONTRAST TECHNIQUE: Multidetector CT imaging of the chest was performed using the standard protocol during bolus administration of intravenous contrast. Multiplanar CT image reconstructions and MIPs were obtained to evaluate the vascular anatomy. RADIATION DOSE REDUCTION: This exam was performed according to the departmental dose-optimization program which includes automated exposure control, adjustment of the mA and/or kV according to patient size and/or use of iterative reconstruction technique. CONTRAST:  75mL OMNIPAQUE IOHEXOL 350 MG/ML SOLN COMPARISON:  None Available. FINDINGS: Cardiovascular: There is preferential opacification of the pulmonary arterial tree opacification of the systemic arterial structures is adequate. There are multiple branching intraluminal filling defects identified, some of which appears eccentric, within the segmental pulmonary arteries of the lower lobes bilaterally in keeping with acute to subacute pulmonary emboli. The embolic  burden is small. The central pulmonary arteries are enlarged in keeping with changes of pulmonary arterial hypertension. There is no CT evidence of right heart strain. Mild coronary artery calcification. Global cardiac size is within normal limits. Possible atrial septal defect passage of contrast across the interatrial septum at axial image # 93/10. No pericardial effusion. The thoracic aorta is of normal caliber. Mild atherosclerotic calcification within the thoracic aorta. 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 There is, however, bulky calcified irregular atherosclerotic plaque at the origin of the right subclavian artery which results in a 85-95% stenosis of the right subclavian artery at its origin. There is a small amount of associated free-floating intraluminal thrombus with this plaque best seen on image # 21/10, 79/13 and 77/14. Mediastinum/Nodes: There is pathologic right paratracheal and subcarinal adenopathy with the index lymph node measuring 3.8 x 4.0 cm at axial image # 30/10. This likely represents nodal metastatic disease unknown primary a or a lymphoproliferative process such as lymphoma. Visualized thyroid is unremarkable. The esophagus demonstrates circumferential wall thickening in its mid segment, not well characterized on this examination, possibly related to inflammatory process such as esophagitis, or infiltrative disease. Lungs/Pleura: There is moderate emphysema with marked pulmonary hyperinflation. A spiculated nodule is seen within the right lower lobe measuring 9 mm in mean diameter at axial image # 95/11, possibly representing a primary lesion as be seen  in the setting small cell lung cancer. 5 mm indeterminate pulmonary nodule is seen within the right upper lobe, axial image # 71/11. A elongate peribronchial nodule is seen within the left upper lobe with a mean diameter 15 mm, indeterminate but also  suspicious for a possible synchronous primary bronchogenic neoplasm. More focal consolidation within the subpleural left lower lobe is in keeping with a subacute pulmonary infarct. No pneumothorax or pleural effusion. Upper Abdomen: No acute abnormality. Musculoskeletal: No acute bone abnormality. No suspicious lytic or blastic bone lesion. Remote superior endplate fracture of L1 with mild loss of height anteriorly. Review of the MIP images confirms the above findings. IMPRESSION: Complex examination. Acute to subacute pulmonary emboli with small embolic burden. No CT evidence of right heart strain. Left lower lobe subsegmental pulmonary infarct. Suspected atrial septal defect. Given the above findings, a paradoxical embolus is possible, but considered less likely within the right upper extremity given the high-grade stenosis described below the origin of the subclavian artery. 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 Associated free-floating thrombus with this plaque noted and is favored to represent embolic source for the reported right upper extremity peripheral embolic disease. Pathologic mediastinal adenopathy, favored to represent nodal metastatic disease given the associated findings within the right lower lobe and left upper lobe. PET CT examination may be helpful for complete staging and to direct biopsy strategy. Multiple pulmonary nodules, indeterminate, but suspicious for synchronous primary bronchogenic neoplasm within the right lower lobe and left upper lobe. This could be further assessed with PET CT examination. Moderate emphysema Aortic Atherosclerosis (ICD10-I70.0) and Emphysema (ICD10-J43.9). Electronically Signed   By: Helyn Numbers M.D.   On: 03/09/2024 20:57   ECHOCARDIOGRAM COMPLETE Result Date: 03/08/2024    ECHOCARDIOGRAM REPORT   Patient Name:   Kristina Howe Date of Exam: 03/08/2024  Medical Rec #:  [id number]      Height:       69.0 in Accession #:    [id number]     Weight:       108.0 lb Date of Birth:  09-Jul-1961      BSA:          1.590 m Patient Age:    62 years       BP:           108/65 mmHg Patient Gender: F              HR:           55 bpm. Exam Location:  Inpatient Procedure: 2D Echo, Color Doppler and Cardiac Doppler (Both Spectral and Color            Flow Doppler were utilized during procedure). Indications:    Endocarditis I38  History:        Patient has no prior history of Echocardiogram examinations.  Sonographer:    Harriette Bouillon RDCS Referring Phys: [id number] Dolly Rias IMPRESSIONS  1. Left ventricular ejection fraction, by estimation, is 60 to 65%. The left ventricle has normal function. The left ventricle has no regional wall motion abnormalities. Left ventricular diastolic parameters were normal.  2. Right ventricular systolic function is normal. The right ventricular size is normal. There is normal pulmonary artery systolic pressure.  3. The mitral valve is normal in structure. No evidence of mitral valve regurgitation. No evidence of mitral stenosis.  4. The aortic valve is normal in structure. Aortic valve regurgitation is not visualized. No  aortic stenosis is present.  5. The inferior vena cava is normal in size with greater than 50% respiratory variability, suggesting right atrial pressure of 3 mmHg. Conclusion(s)/Recommendation(s): No evidence of valvular vegetations on this transthoracic echocardiogram. Consider a transesophageal echocardiogram to exclude infective endocarditis if clinically indicated. FINDINGS  Left Ventricle: Left ventricular ejection fraction, by estimation, is 60 to 65%. The left ventricle has normal function. The left ventricle has no regional wall motion abnormalities. The left ventricular internal cavity size was normal in size. There is  no left ventricular hypertrophy. Left ventricular diastolic parameters were normal. Right Ventricle:  The right ventricular size is normal. No increase in right ventricular wall thickness. Right ventricular systolic function is normal. There is normal pulmonary artery systolic pressure. The tricuspid regurgitant velocity is 2.30 m/s, and  with an assumed right atrial pressure of 3 mmHg, the estimated right ventricular systolic pressure is 24.2 mmHg. Left Atrium: Left atrial size was normal in size. Right Atrium: Right atrial size was normal in size. Pericardium: There is no evidence of pericardial effusion. Mitral Valve: The mitral valve is normal in structure. No evidence of mitral valve regurgitation. No evidence of mitral valve stenosis. Tricuspid Valve: The tricuspid valve is normal in structure. Tricuspid valve regurgitation is trivial. No evidence of tricuspid stenosis. Aortic Valve: The aortic valve is normal in structure. Aortic valve regurgitation is not visualized. No aortic stenosis is present. Pulmonic Valve: The pulmonic valve was normal in structure. Pulmonic valve regurgitation is not visualized. No evidence of pulmonic stenosis. Aorta: The aortic root is normal in size and structure. Venous: The inferior vena cava is normal in size with greater than 50% respiratory variability, suggesting right atrial pressure of 3 mmHg. IAS/Shunts: No atrial level shunt detected by color flow Doppler.  LEFT VENTRICLE PLAX 2D LVIDd:         4.20 cm   Diastology LVIDs:         3.10 cm   LV e' medial:    7.62 cm/s LV PW:         1.00 cm   LV E/e' medial:  13.1 LV IVS:        1.10 cm   LV e' lateral:   10.60 cm/s LVOT diam:     1.90 cm   LV E/e' lateral: 9.4 LV SV:         76 LV SV Index:   48 LVOT Area:     2.84 cm  RIGHT VENTRICLE             IVC RV S prime:     11.40 cm/s  IVC diam: 1.40 cm TAPSE (M-mode): 2.4 cm LEFT ATRIUM             Index        RIGHT ATRIUM           Index LA diam:        3.00 cm 1.89 cm/m   RA Area:     11.50 cm LA Vol (A2C):   24.9 ml 15.66 ml/m  RA Volume:   22.80 ml  14.34 ml/m LA Vol  (A4C):   38.9 ml 24.47 ml/m LA Biplane Vol: 32.9 ml 20.69 ml/m  AORTIC VALVE LVOT Vmax:   117.00 cm/s LVOT Vmean:  77.000 cm/s LVOT VTI:    0.267 m  AORTA Ao Root diam: 2.70 cm Ao Asc diam:  3.00 cm MITRAL VALVE  TRICUSPID VALVE MV Area (PHT): 3.17 cm     TR Peak grad:   21.2 mmHg MV Decel Time: 239 msec     TR Vmax:        230.00 cm/s MV E velocity: 100.00 cm/s MV A velocity: 77.60 cm/s   SHUNTS MV E/A ratio:  1.29         Systemic VTI:  0.27 m                             Systemic Diam: 1.90 cm Rachelle Hora Croitoru MD Electronically signed by Thurmon Fair MD Signature Date/Time: 03/08/2024/2:17:42 PM    Final    CT ANGIO UP EXTREM RIGHT W &/OR WO CONTRAST Result Date: 03/07/2024 CLINICAL DATA:  Acute onset right index finger discoloration and swelling. EXAM: CT ANGIOGRAPHY OF THE RIGHT UPPEREXTREMITY TECHNIQUE: Multidetector CT imaging of the right upperwas performed using the standard protocol during bolus administration of intravenous contrast. Multiplanar CT image reconstructions and MIPs were obtained to evaluate the vascular anatomy. RADIATION DOSE REDUCTION: This exam was performed according to the departmental dose-optimization program which includes automated exposure control, adjustment of the mA and/or kV according to patient size and/or use of iterative reconstruction technique. CONTRAST:  75mL OMNIPAQUE IOHEXOL 350 MG/ML SOLN COMPARISON:  None Available. FINDINGS: Vascular The right subclavian, axillary, brachial, radial, and ulnar arteries are unremarkable. The deep and superficial palmar arches are unremarkable. The digital arteries are poorly evaluated. Bones/Joint/Cartilage No fracture or dislocation. Degenerative changes of the radiocarpal joint. No joint effusion. Ligaments Ligaments are suboptimally evaluated by CT. Muscles and Tendons Grossly intact. Soft tissue No fluid collection or subcutaneous emphysema.  No soft tissue mass. Mild lobular simple emphysema. Multiple nodules in  the right upper lobe measuring up to 7 mm (series 4, image 76). Review of the MIP images confirms the above findings. IMPRESSION: 1. No acute vascular abnormality. Poor evaluation of the digital arteries is likely technical. Consider dedicated upper extremity angiogram for better evaluation as clinically indicated. 2. Multiple nodules in the right upper lobe measuring up to 7 mm. Non-contrast chest CT at 3-6 months is recommended. 3.  Emphysema (ICD10-J43.9). Electronically Signed   By: Obie Dredge M.D.   On: 03/07/2024 15:53     Subjective: No acute issues/events overnight   Discharge Exam: Vitals:   03/11/24 2346 03/12/24 0400  BP: 112/60 115/66  Pulse: 73 66  Resp: 13 13  Temp: 98.1 F (36.7 C) 98.4 F (36.9 C)  SpO2: 97% 99%   Vitals:   03/11/24 1549 03/11/24 2011 03/11/24 2346 03/12/24 0400  BP: 129/69 (!) 100/59 112/60 115/66  Pulse: (!) 58 72 73 66  Resp: 14 13 13 13   Temp: 98 F (36.7 C) 98.2 F (36.8 C) 98.1 F (36.7 C) 98.4 F (36.9 C)  TempSrc: Oral Oral Oral Oral  SpO2: 97% 100% 97% 99%  Weight:      Height:        General: Pt is alert, awake, not in acute distress Cardiovascular: RRR, S1/S2 +, no rubs, no gallops Respiratory: CTA bilaterally, no wheezing, no rhonchi Abdominal: Soft, NT, ND, bowel sounds + Extremities: no edema, no cyanosis    The results of significant diagnostics from this hospitalization (including imaging, microbiology, ancillary and laboratory) are listed below for reference.     Microbiology: Recent Results (from the past 240 hours)  Blood culture (routine x 2)     Status: None   Collection Time:  03/07/24  2:33 PM   Specimen: BLOOD  Result Value Ref Range Status   Specimen Description   Final    BLOOD BLOOD LEFT FOREARM Performed at [facility], [address] [place], [place] [address]    Special Requests   Final    BOTTLES DRAWN AEROBIC AND ANAEROBIC Blood Culture adequate volume Performed at [facility], [address] [place], [place] [address]    Culture   Final    NO GROWTH 5 DAYS Performed at [facility], [address], [place], [place] [address]    Report Status 03/12/2024 FINAL  Final  Blood culture (routine x 2)     Status: None   Collection Time: 03/07/24  2:46 PM   Specimen: BLOOD  Result Value Ref Range Status   Specimen Description   Final    BLOOD RIGHT ANTECUBITAL Performed at [facility], [address] [place] [address] [place], [place] [address]    Special Requests   Final    BOTTLES DRAWN AEROBIC AND ANAEROBIC Blood Culture adequate volume Performed at [facility], [address], [place], [place] [address]    Culture   Final    NO GROWTH 5 DAYS Performed at [facility], [address], [place], [place] [address]    Report Status 03/12/2024 FINAL  Final  MRSA Next Gen by PCR, Nasal     Status: None   Collection Time: 03/10/24 10:09 PM   Specimen: Nasal Mucosa; Nasal Swab  Result Value Ref Range Status   MRSA by PCR Next Gen NOT DETECTED NOT DETECTED Final    Comment: (NOTE) The GeneXpert MRSA Assay (FDA approved for NASAL specimens only), is one component of a comprehensive MRSA colonization surveillance program. It is not intended to diagnose MRSA infection nor to guide or monitor treatment for MRSA infections. Test performance is not FDA approved in patients less than 26 years old. Performed at [facility], [address], [place], [place] [address]      Labs: BNP (last 3 results) No results for input(s): "BNP" in the last 8760 hours. Basic Metabolic Panel: Recent Labs  Lab 03/08/24 0754 03/09/24 0618 03/10/24 0535 03/11/24 0622 03/11/24 1329 03/12/24 0257  NA 137 139 137 138 138 137  K 3.6 3.7 3.6 4.0 4.1 4.4  CL 102 100 103 104  --  105  CO2 22 26 23 25   --  23  GLUCOSE 79 93 92 89  --  112*  BUN 21 16 15 19   --  18  CREATININE 0.77 0.86 0.72 0.80  --  0.92   CALCIUM 9.7 9.7 9.3 9.7  --  9.0  MG 1.9  --   --   --   --   --   PHOS 2.6  --   --   --   --   --    Liver Function Tests: Recent Labs  Lab 03/07/24 1434  AST 19  ALT 11  ALKPHOS 64  BILITOT 0.3  PROT 7.0  ALBUMIN 4.1   No results for input(s): "LIPASE", "AMYLASE" in the last 168 hours. No results for input(s): "AMMONIA" in the last 168 hours. CBC: Recent Labs  Lab 03/07/24 1434 03/08/24 0754 03/09/24 0618 03/10/24 0535 03/11/24 0622 03/11/24 1329 03/12/24 0257  WBC 6.5 6.2 4.7 4.7 4.3  --  6.4  NEUTROABS 4.9  --  3.1 3.0  --   --   --   HGB 11.7* 11.3*  10.9* 11.0* 11.2* 10.5* 8.1*  HCT 35.7* 34.8* 33.0* 33.8* 34.1* 31.0* 24.8*  MCV 93.5 93.0 93.2 93.4 93.4  --  95.0  PLT 241 253 218 218 211  --  187   Cardiac Enzymes: No results for input(s): "CKTOTAL", "CKMB", "CKMBINDEX", "TROPONINI" in the last 168 hours. BNP: Invalid input(s): "POCBNP" CBG: No results for input(s): "GLUCAP" in the last 168 hours. D-Dimer No results for input(s): "DDIMER" in the last 72 hours. Hgb A1c No results for input(s): "HGBA1C" in the last 72 hours. Lipid Profile No results for input(s): "CHOL", "HDL", "LDLCALC", "TRIG", "CHOLHDL", "LDLDIRECT" in the last 72 hours. Thyroid function studies No results for input(s): "TSH", "T4TOTAL", "T3FREE", "THYROIDAB" in the last 72 hours.  Invalid input(s): "FREET3" Anemia work up No results for input(s): "VITAMINB12", "FOLATE", "FERRITIN", "TIBC", "IRON", "RETICCTPCT" in the last 72 hours. Urinalysis    Component Value Date/Time   COLORURINE YELLOW 04/12/2022 1117   APPEARANCEUR CLEAR 04/12/2022 1117   LABSPEC 1.011 04/12/2022 1117   PHURINE 7.0 04/12/2022 1117   GLUCOSEU NEGATIVE 04/12/2022 1117   HGBUR NEGATIVE 04/12/2022 1117   BILIRUBINUR NEGATIVE 04/12/2022 1117   KETONESUR NEGATIVE 04/12/2022 1117   PROTEINUR NEGATIVE 04/12/2022 1117   NITRITE NEGATIVE 04/12/2022 1117   LEUKOCYTESUR NEGATIVE 04/12/2022 1117   Sepsis  Labs Recent Labs  Lab 03/09/24 0618 03/10/24 0535 03/11/24 0622 03/12/24 0257  WBC 4.7 4.7 4.3 6.4   Microbiology Recent Results (from the past 240 hours)  Blood culture (routine x 2)     Status: None   Collection Time: 03/07/24  2:33 PM   Specimen: BLOOD  Result Value Ref Range Status   Specimen Description   Final    BLOOD BLOOD LEFT FOREARM Performed at [facility], [address] [place], [place] [address]    Special Requests   Final    BOTTLES DRAWN AEROBIC AND ANAEROBIC Blood Culture adequate volume Performed at [facility], [address] [place], [place] [address]    Culture   Final    NO GROWTH 5 DAYS Performed at [facility], [address] [place], [place] [address]    Report Status 03/12/2024 FINAL  Final  Blood culture (routine x 2)     Status: None   Collection Time: 03/07/24  2:46 PM   Specimen: BLOOD  Result Value Ref Range Status   Specimen Description   Final    BLOOD RIGHT ANTECUBITAL Performed at [facility], [address] [place], [place] [address]    Special Requests   Final    BOTTLES DRAWN AEROBIC AND ANAEROBIC Blood Culture adequate volume Performed at [facility], [address], [place], [place] [address]    Culture   Final    NO GROWTH 5 DAYS Performed at [facility], [address], [place], [place] [address]    Report Status 03/12/2024 FINAL  Final  MRSA Next Gen by PCR, Nasal     Status: None   Collection Time: 03/10/24 10:09 PM   Specimen: Nasal Mucosa; Nasal Swab  Result Value Ref Range Status   MRSA by PCR Next Gen NOT DETECTED NOT DETECTED Final    Comment: (NOTE) The GeneXpert MRSA Assay (FDA approved for NASAL specimens only), is one component of a comprehensive MRSA colonization surveillance program. It is not intended to diagnose MRSA infection nor to guide or monitor treatment for MRSA infections. Test performance is not  FDA approved in patients less than 57 years old.  Performed at [facility], [address], [place], [place] [address]      Time coordinating discharge: Over 30 minutes  SIGNED:   Azucena Fallen, DO [facility] 03/12/2024, 1:54 PM Pager   If 7PM-7AM, please contact night-coverage [email or web address]

## 2024-03-12 NOTE — Progress Notes (Signed)
 Discharge instructions (including medications) discussed with and copy provided to patient/caregiver

## 2024-03-12 NOTE — Progress Notes (Signed)
   03/12/24 1003  TOC Brief Assessment  Insurance and Status Reviewed  Patient has primary care physician Yes  Home environment has been reviewed home w/ boyfriend  Prior level of function: self/independent  Prior/Current Home Services No current home services  Social Drivers of Health Review SDOH reviewed no interventions necessary  Readmission risk has been reviewed Yes  Transition of care needs no transition of care needs at this time    Pt stable for transition home today, no HH or DME needs noted.

## 2024-03-12 NOTE — Plan of Care (Signed)
  Problem: Education: Goal: Knowledge of General Education information will improve Description: Including pain rating scale, medication(s)/side effects and non-pharmacologic comfort measures Outcome: Adequate for Discharge   Problem: Health Behavior/Discharge Planning: Goal: Ability to manage health-related needs will improve Outcome: Adequate for Discharge   Problem: Clinical Measurements: Goal: Ability to maintain clinical measurements within normal limits will improve Outcome: Adequate for Discharge Goal: Will remain free from infection Outcome: Adequate for Discharge Goal: Diagnostic test results will improve Outcome: Adequate for Discharge Goal: Respiratory complications will improve Outcome: Adequate for Discharge Goal: Cardiovascular complication will be avoided Outcome: Adequate for Discharge   Problem: Activity: Goal: Risk for activity intolerance will decrease Outcome: Adequate for Discharge   Problem: Nutrition: Goal: Adequate nutrition will be maintained Outcome: Adequate for Discharge   Problem: Coping: Goal: Level of anxiety will decrease Outcome: Adequate for Discharge   Problem: Elimination: Goal: Will not experience complications related to bowel motility Outcome: Adequate for Discharge Goal: Will not experience complications related to urinary retention Outcome: Adequate for Discharge   Problem: Pain Managment: Goal: General experience of comfort will improve and/or be controlled Outcome: Adequate for Discharge   Problem: Safety: Goal: Ability to remain free from injury will improve Outcome: Adequate for Discharge   Problem: Skin Integrity: Goal: Risk for impaired skin integrity will decrease Outcome: Adequate for Discharge   Problem: Education: Goal: Knowledge of discharge needs will improve Outcome: Adequate for Discharge   Problem: Clinical Measurements: Goal: Postoperative complications will be avoided or minimized Outcome: Adequate for  Discharge   Problem: Respiratory: Goal: Will achieve and/or maintain a regular respiratory rate, without signs or symptoms of dyspnea Outcome: Adequate for Discharge   Problem: Skin Integrity: Goal: Demonstration of wound healing without infection will improve Outcome: Adequate for Discharge

## 2024-03-12 NOTE — Telephone Encounter (Signed)
 Pt scheduled. NFN

## 2024-03-12 NOTE — Telephone Encounter (Signed)
 Pharmacy Patient Advocate Encounter  Insurance verification completed.    The patient is insured through [employer]. Patient has Medicare and is not eligible for a copay card, but may be able to apply for patient assistance or Medicare RX Payment Plan (Patient Must reach out to their plan, if eligible for payment plan), if available.    Ran test claim for Eliquis and the current 30 day co-pay is $585.78.  Ran test claim for Xarelto and the current 30 day co-pay is $577.82.  This test claim was processed through [facility]- copay amounts may vary at other pharmacies due to pharmacy/plan contracts, or as the patient moves through the different stages of their insurance plan.

## 2024-03-12 NOTE — Telephone Encounter (Signed)
 Obtained/activated Eliquis card for pt, bringing copay down to $10

## 2024-03-12 NOTE — Progress Notes (Signed)
 PHARMACY - ANTICOAGULATION CONSULT NOTE  Pharmacy Consult for heparin Indication: pulmonary embolus  No Known Allergies  Patient Measurements: Height: 5\' 9"  (175.3 cm) Weight: 50.8 kg (112 lb) IBW/kg (Calculated) : 66.2 HEPARIN DW (KG): 50.8  Vital Signs: Temp: 98.4 F (36.9 C) (04/04 0400) Temp Source: Oral (04/04 0400) BP: 115/66 (04/04 0400) Pulse Rate: 66 (04/04 0400)  Labs: Recent Labs    03/10/24 0535 03/10/24 1235 03/11/24 0622 03/11/24 1329 03/12/24 0257  HGB 11.0*  --  11.2* 10.5* 8.1*  HCT 33.8*  --  34.1* 31.0* 24.8*  PLT 218  --  211  --  187  HEPARINUNFRC 0.96* 0.35 0.20*  --  0.30  CREATININE 0.72  --  0.80  --  0.92    Estimated Creatinine Clearance: 51.5 mL/min (by C-G formula based on SCr of 0.92 mg/dL).   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 Pharmacy consulted to dose heparin.  Heparin level now subtherapeutic.  Confirmed with RN that there hasn't been any issue with the heparin infusion, pump nor IV site.  No bleeding reported.  Initial heparin level of 0.96 units/mL could reflect heparin bolus post SQ Lovenox dose.  4/4 AM update:  Heparin level therapeutic after re-start s/p vascular surgery procedures   Goal of Therapy:  Heparin level 0.3-0.7 units/ml Monitor platelets by anticoagulation protocol: Yes   Plan:  Cont heparin 800 units/hr Heparin level in 6-8 hours Hgb down some post-procedure-watch closely   Abran Duke, PharmD, BCPS Clinical Pharmacist Phone: [phone]

## 2024-03-16 DIAGNOSIS — E785 Hyperlipidemia, unspecified: Secondary | ICD-10-CM | POA: Diagnosis not present

## 2024-03-16 DIAGNOSIS — L819 Disorder of pigmentation, unspecified: Secondary | ICD-10-CM | POA: Diagnosis not present

## 2024-03-16 DIAGNOSIS — M79644 Pain in right finger(s): Secondary | ICD-10-CM | POA: Diagnosis not present

## 2024-03-16 DIAGNOSIS — I10 Essential (primary) hypertension: Secondary | ICD-10-CM | POA: Diagnosis not present

## 2024-03-16 DIAGNOSIS — R29898 Other symptoms and signs involving the musculoskeletal system: Secondary | ICD-10-CM | POA: Diagnosis not present

## 2024-03-16 DIAGNOSIS — Z7901 Long term (current) use of anticoagulants: Secondary | ICD-10-CM | POA: Diagnosis not present

## 2024-03-16 DIAGNOSIS — R918 Other nonspecific abnormal finding of lung field: Secondary | ICD-10-CM | POA: Diagnosis not present

## 2024-03-16 DIAGNOSIS — F17210 Nicotine dependence, cigarettes, uncomplicated: Secondary | ICD-10-CM | POA: Diagnosis not present

## 2024-03-16 DIAGNOSIS — F419 Anxiety disorder, unspecified: Secondary | ICD-10-CM | POA: Diagnosis not present

## 2024-03-16 DIAGNOSIS — R4589 Other symptoms and signs involving emotional state: Secondary | ICD-10-CM | POA: Diagnosis not present

## 2024-03-16 DIAGNOSIS — R636 Underweight: Secondary | ICD-10-CM | POA: Diagnosis not present

## 2024-03-16 DIAGNOSIS — Z79899 Other long term (current) drug therapy: Secondary | ICD-10-CM | POA: Diagnosis not present

## 2024-03-17 ENCOUNTER — Encounter: Payer: Self-pay | Admitting: Vascular Surgery

## 2024-03-19 DIAGNOSIS — D649 Anemia, unspecified: Secondary | ICD-10-CM | POA: Diagnosis not present

## 2024-03-24 ENCOUNTER — Ambulatory Visit
Admission: RE | Admit: 2024-03-24 | Discharge: 2024-03-24 | Disposition: A | Discharge status: Home / Self Care | Source: Ambulatory Visit | Attending: Vascular Surgery | Admitting: Vascular Surgery

## 2024-03-24 DIAGNOSIS — I771 Stricture of artery: Secondary | ICD-10-CM

## 2024-03-24 MED ORDER — IOPAMIDOL (ISOVUE-370) INJECTION 76%
75.0000 mL | Freq: Once | INTRAVENOUS | Status: AC | PRN
  Administered 2024-03-24: 75 mL via INTRAVENOUS

## 2024-04-02 DIAGNOSIS — D649 Anemia, unspecified: Secondary | ICD-10-CM | POA: Diagnosis not present

## 2024-04-02 DIAGNOSIS — M79644 Pain in right finger(s): Secondary | ICD-10-CM | POA: Diagnosis not present

## 2024-04-02 DIAGNOSIS — L819 Disorder of pigmentation, unspecified: Secondary | ICD-10-CM | POA: Diagnosis not present

## 2024-04-02 HISTORY — DX: Anemia, unspecified: D64.9

## 2024-04-06 NOTE — Progress Notes (Signed)
 Patient ID: Kristina Howe, female   DOB: 29-Jan-1961, 63 y.o.   MRN: [id number]  Reason for Consult: Routine Post Op   Referred by No ref. provider found  Subjective:     HPI  Kristina Howe is a 63 y.o. 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 Today she reports her right index finger has been feeling better and the color has improved.  Still has some decreased motor and sensation but has been improving.  Past Medical History:  Diagnosis Date   Elevated cholesterol    Hypertension    History reviewed. No pertinent family history. Past Surgical History:  Procedure Laterality Date   ABDOMINAL HYSTERECTOMY     AORTOGRAM N/A 03/11/2024   Procedure: AORTIC ARCH ANGIOGRAM;  Surgeon: Philipp Brawn, MD;  Location: [facility] OR;  Service: Vascular;  Laterality: N/A;   INSERTION OF RETROGRADE CAROTID STENT Right 03/11/2024   Procedure: INSERTION RIGHT SUBCLAVIAN ARTERY AND RIGHT COMMON CAROTID ARTERY STENTS;  Surgeon: Philipp Brawn, MD;  Location: [facility] OR;  Service: Vascular;  Laterality: Right;   THORACIC EXPOSURE Right 03/11/2024   Procedure: DIRECT EXPOSURE OF RIGHT COMMON CAROTID ARTERY;  Surgeon: Philipp Brawn, MD;  Location: [facility] OR;  Service: Vascular;  Laterality: Right;   ULTRASOUND GUIDANCE FOR VASCULAR ACCESS Right 03/11/2024   Procedure: ULTRASOUND GUIDANCE, FOR VASCULAR ACCESS, RIGHT RADIAL ARTERY AND RIGHT FEMORAL ARTERY;  Surgeon: Philipp Brawn, MD;  Location: [facility] OR;  Service: Vascular;  Laterality: Right;   UPPER EXTREMITY ANGIOGRAM Right 03/11/2024   Procedure: [employer], RIGHT UPPER EXTREMITY;  Surgeon: Philipp Brawn, MD;  Location: [facility] OR;  Service: Vascular;  Laterality: Right;    Short Social History:  Social History   Tobacco Use   Smoking status: Former    Types: Cigarettes    Start date: 03/18/2024     Quit date: 03/18/1994    Years since quitting: 30.0   Smokeless tobacco: Not on file  Substance Use Topics   Alcohol use: Yes    No Known Allergies  Current Outpatient Medications  Medication Sig Dispense Refill   ALPRAZolam  (XANAX ) 0.5 MG tablet Take 0.5 mg by mouth.     apixaban  (ELIQUIS ) 5 MG TABS tablet Take 1 tablet (5 mg total) by mouth 2 (two) times daily. 60 tablet 1   APIXABAN  (ELIQUIS ) VTE STARTER PACK (10MG  AND 5MG ) Take as directed on package: start with two-5mg  tablets twice daily for 7 days. On day 8, switch to one-5mg  tablet twice daily. 74 each 0   atorvastatin  (LIPITOR) 40 MG tablet Take 1 tablet (40 mg total) by mouth daily. 30 tablet 0   citalopram  (CELEXA ) 20 MG tablet Take 1 tablet by mouth daily.     magnesium 30 MG tablet Take 30 mg by mouth 2 (two) times daily.     Multiple Vitamins-Minerals (MULTIVITAMIN WITH MINERALS) tablet Take 1 tablet by mouth daily.     NIFEdipine  (ADALAT  CC) 30 MG 24 hr tablet Take 1 tablet (30 mg total) by mouth daily. 30 tablet 0   omega-3 acid ethyl esters (LOVAZA) 1 g capsule Take 2 g by mouth daily.     tiZANidine (ZANAFLEX) 2 MG tablet Take 2 mg by mouth every 6 (six) hours as needed.     triamterene-hydrochlorothiazide (MAXZIDE-25) 37.5-25 MG per tablet Take 1 tablet by  mouth daily.       valsartan (DIOVAN) 320 MG tablet Take 320 mg by mouth daily.     No current facility-administered medications for this visit.    REVIEW OF SYSTEMS   All other systems were reviewed and are negative     Objective:  Objective   Vitals:   04/09/24 0827 04/09/24 0829  BP: 131/86 129/82  Pulse: 88   Temp: 97.9 F (36.6 C)   SpO2: 97%   Weight: 110 lb (49.9 kg)   Height: 5\' 9"  (1.753 m)    Body mass index is 16.24 kg/m.  Physical Exam General: no acute distress Cardiac: hemodynamically stable Pulm: normal work of breathing Neuro: alert, no focal deficit Extremities: Right index finger slightly cyanotic with some callus  skin. Vascular:   Right: Palpable radial, brachial  Left: Palpable radial, brachial   Data: CTA chest independently reviewed Good apposition with wide patency of stents in innominate, right subclavian and right carotid arteries.     Assessment/Plan:     Kristina Howe is a 63 y.o. female who is now 1 month status post double barrel stenting of the innominate into the right subclavian and carotid arteries for treatment of a severe stenosis with distal embolization to the hand. She has recovered well and I explained that her CT scan looks great with widely patent stents.  We also discussed that it is encouraging that her finger color and sensation has improved although I did explain that it may never be 100% again.  Refilled Eliquis , will plan for total treatment of 3 months. Explained she is okay to stop nifedipine  at this time, but she will call if her finger starts to feel worse a few days after stopping in which we will refill the nifedipine . Continue aspirin  81 mg and Eliquis  Follow-up in 6 months with a carotid duplex     Philipp Brawn MD [facility]

## 2024-04-09 ENCOUNTER — Encounter: Payer: Self-pay | Admitting: Vascular Surgery

## 2024-04-09 ENCOUNTER — Ambulatory Visit: Attending: Vascular Surgery | Admitting: Vascular Surgery

## 2024-04-09 VITALS — BP 129/82 | HR 88 | Temp 97.9°F | Ht 69.0 in | Wt 110.0 lb

## 2024-04-09 DIAGNOSIS — F419 Anxiety disorder, unspecified: Secondary | ICD-10-CM | POA: Diagnosis not present

## 2024-04-09 DIAGNOSIS — L819 Disorder of pigmentation, unspecified: Secondary | ICD-10-CM | POA: Diagnosis not present

## 2024-04-09 DIAGNOSIS — I771 Stricture of artery: Secondary | ICD-10-CM | POA: Diagnosis not present

## 2024-04-09 DIAGNOSIS — M79644 Pain in right finger(s): Secondary | ICD-10-CM | POA: Diagnosis not present

## 2024-04-09 DIAGNOSIS — Z87891 Personal history of nicotine dependence: Secondary | ICD-10-CM | POA: Diagnosis not present

## 2024-04-09 MED ORDER — APIXABAN 5 MG PO TABS
5.0000 mg | ORAL_TABLET | Freq: Two times a day (BID) | ORAL | 1 refills | Status: DC
Start: 2024-04-09 — End: 2024-05-17

## 2024-04-14 ENCOUNTER — Telehealth: Payer: Self-pay | Admitting: Emergency Medicine

## 2024-04-14 NOTE — Telephone Encounter (Unsigned)
 Copied from CRM [id number]. Topic: General - Other >> Apr 14, 2024 10:08 AM Kristina Howe wrote: Reason for CRM: patient want to know if he will be getting a biopsy of his lung and do someone need to be there with him

## 2024-04-14 NOTE — Telephone Encounter (Signed)
 We are seeing her on 04/16/2024 so we can decide about that and determine the timing.

## 2024-04-15 NOTE — Telephone Encounter (Signed)
 Spoke with Blanca Bunch. I informed pt ov for 5/9 is consult visit and having a biopsy will be discussed during that time. Pt is stressed about this and was crying over the phone. I advised pt it is her decision. Pt verbalized understanding & had no other concerns. Nothing further needed.

## 2024-04-16 ENCOUNTER — Other Ambulatory Visit: Payer: Self-pay

## 2024-04-16 ENCOUNTER — Encounter: Payer: Self-pay | Admitting: Emergency Medicine

## 2024-04-16 ENCOUNTER — Ambulatory Visit: Admitting: Emergency Medicine

## 2024-04-16 VITALS — BP 125/83 | HR 101 | Ht 67.0 in | Wt 110.8 lb

## 2024-04-16 DIAGNOSIS — I2699 Other pulmonary embolism without acute cor pulmonale: Secondary | ICD-10-CM | POA: Insufficient documentation

## 2024-04-16 DIAGNOSIS — Z720 Tobacco use: Secondary | ICD-10-CM | POA: Insufficient documentation

## 2024-04-16 DIAGNOSIS — I731 Thromboangiitis obliterans [Buerger's disease]: Secondary | ICD-10-CM | POA: Diagnosis not present

## 2024-04-16 DIAGNOSIS — R590 Localized enlarged lymph nodes: Secondary | ICD-10-CM

## 2024-04-16 DIAGNOSIS — Z87891 Personal history of nicotine dependence: Secondary | ICD-10-CM

## 2024-04-16 DIAGNOSIS — R918 Other nonspecific abnormal finding of lung field: Secondary | ICD-10-CM

## 2024-04-16 DIAGNOSIS — I771 Stricture of artery: Secondary | ICD-10-CM

## 2024-04-16 NOTE — H&P (View-Only) (Signed)
 Subjective:    Patient ID: Kristina Howe, female    DOB: [birth year], 63 y.o.   MRN: [id number]  HPI Kristina Howe is a 63 year old female who presents for follow-up of recent hospitalization for right hand digital cyanosis and ischemia.  She was hospitalized from late March to early April due to right hand digital cyanosis and ischemia. During this hospitalization, she underwent evaluation by vascular surgery and required balloon angioplasty and stenting. She was also found to have acute pulmonary embolism with multiple branching intraluminal filling defects on a CT angiogram of the chest performed on March 09, 2024. She underwent stent placement from the innominate artery to the right common carotid and right subclavian arteries.  Her anticoagulation therapy was initially with heparin , which was later converted to Eliquis  at discharge. She is currently on Eliquis , with a plan to continue for at least three months following her vascular surgery follow-up on Apr 09, 2024.  The CT scan of the chest also revealed mediastinal lymphadenopathy and bilateral pulmonary nodules, including a 9 mm right lower lobe spiculated nodule, a 5 mm right upper lobe nodule, and a 15 mm elongated parabronchial left upper lobe nodule. There was pathologic right paratracheal and subcarinal adenopathy up to 4 cm, and circumferential wall thickening of the midesophagus.  She quit smoking on March 18, 2024, after smoking about half a pack a day for 40 years. She denies shortness of breath and is not on any breathing medications. She expresses significant anxiety and emotional distress related to her medical condition.  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 She has been in [employer] for over 30 years and has lived locally since 1984. She denies any history of tuberculosis or known exposure to  TB.   RADIOLOGY CT angio chest: Acute pulmonary embolism with multiple branching intraluminal filling defects (03/09/2024) CT chest: Mediastinal lymphadenopathy, bilateral pulmonary nodules: 9 mm right lower lobe spiculated nodule, 5 mm right upper lobe nodule, elongated parabranchial left upper lobe nodule 15 mm, pathologic right paratracheal and subcarinal adenopathy up to 4 cm, circumferential wall thickening of the midesophagus (03/09/2024)   Review of Systems As per HPI  Past Medical History:  Diagnosis Date   Elevated cholesterol    Hypertension   Peripheral vascular disease Pulmonary embolism  No family history on file.   Social History   Socioeconomic History   Marital status: Significant Other    Spouse name: Not on file   Number of children: Not on file   Years of education: Not on file   Highest education level: Not on file  Occupational History   Not on file  Tobacco Use   Smoking status: Former    Types: Cigarettes    Start date: 03/18/2024    Quit date: 03/18/1994    Years since quitting: 30.1   Smokeless tobacco: Not on file   Tobacco comments:    Quit smoking 03/18/2024   Substance and Sexual Activity   Alcohol use: Yes   Drug use: No   Sexual activity: Not on file  Other Topics Concern   Not on file  Social History Narrative   Not on file   Social Drivers of Health   Financial Resource Strain: Not on file  Food Insecurity: Low Risk  (04/09/2024)   Received from [facility]   Hunger Vital Sign    Worried About  Running Out of Food in the Last Year: Never true    Ran Out of Food in the Last Year: Never true  Transportation Needs: No Transportation Needs (04/09/2024)   Received from [facility]    In the past 12 months, has lack of reliable transportation kept you from medical appointments, meetings, work or from getting things needed for daily living? : No  Physical Activity: Not on file  Stress: Not on file  Social  Connections: Not on file  Intimate Partner Violence: Not At Risk (03/08/2024)   Humiliation, Afraid, Rape, and Kick questionnaire    Fear of Current or Ex-Partner: No    Emotionally Abused: No    Physically Abused: No    Sexually Abused: No     No Known Allergies   Outpatient Medications Prior to Visit  Medication Sig Dispense Refill   ALPRAZolam  (XANAX ) 0.5 MG tablet Take 0.5 mg by mouth at bedtime.     apixaban  (ELIQUIS ) 5 MG TABS tablet Take 1 tablet (5 mg total) by mouth 2 (two) times daily. 60 tablet 1   APIXABAN  (ELIQUIS ) VTE STARTER PACK (10MG  AND 5MG ) Take as directed on package: start with two-5mg  tablets twice daily for 7 days. On day 8, switch to one-5mg  tablet twice daily. 74 each 0   atorvastatin  (LIPITOR) 40 MG tablet Take 1 tablet (40 mg total) by mouth daily. 30 tablet 0   magnesium 30 MG tablet Take 30 mg by mouth 2 (two) times daily.     Multiple Vitamins-Minerals (MULTIVITAMIN WITH MINERALS) tablet Take 1 tablet by mouth daily.     omega-3 acid ethyl esters (LOVAZA) 1 g capsule Take 2 g by mouth daily.     tiZANidine (ZANAFLEX) 2 MG tablet Take 2 mg by mouth every 6 (six) hours as needed.     triamterene-hydrochlorothiazide (MAXZIDE-25) 37.5-25 MG per tablet Take 1 tablet by mouth daily.       valsartan (DIOVAN) 320 MG tablet Take 320 mg by mouth daily.     citalopram  (CELEXA ) 20 MG tablet Take 1 tablet by mouth daily.     naloxone (NARCAN) nasal spray 4 mg/0.1 mL Place 0.4 mg into the nose once. (Patient not taking: Reported on 04/16/2024)     NIFEdipine  (ADALAT  CC) 30 MG 24 hr tablet Take 1 tablet (30 mg total) by mouth daily. 30 tablet 0   No facility-administered medications prior to visit.         Objective:   Physical Exam Today's Vitals   04/16/24 1019  BP: 125/83  Pulse: (!) 101  SpO2: 96%  Weight: 110 lb 12.8 oz (50.3 kg)  Height: 5\' 7"  (1.702 m)   Body mass index is 17.35 kg/m.;  Gen: Pleasant, thin, in no distress,  normal affect  ENT: No  lesions,  mouth clear,  oropharynx clear, no postnasal drip  Neck: No JVD, no stridor  Lungs: No use of accessory muscles, distant but no crackles or wheezing on normal respiration, no wheeze on forced expiration  Cardiovascular: RRR, heart sounds normal, no murmur or gallops, no peripheral edema  Musculoskeletal: No deformities, no cyanosis or clubbing  Neuro: alert, awake, non focal  Skin: Warm, no lesions or rash.  Her right hand is in a glove       Assessment & Plan:   Acute pulmonary embolism (HCC) Acute pulmonary embolism Acute pulmonary embolism treated with heparin , transitioned to Eliquis . Extended anticoagulation considered due to potential cancer-related etiology. - Continue Eliquis  for at  least three months. - Consider extended anticoagulation based on further findings, particularly if we find evidence for malignancy.   Pulmonary nodules Pulmonary nodules Multiple bilateral pulmonary nodules identified. Differential includes lung cancer and benign causes.  Consider also pulmonary infarcts in the setting of her PE at the time of her hospitalization.  I think we need to reimage to establish whether the nodules are still present.  If so then we may be able to navigate to these during bronchoscopy - Order repeat CT scan of the chest without contrast. - Consider bronchoscopy with biopsy if nodules persist and are accessible.    Mediastinal adenopathy Mediastinal lymphadenopathy Significant mediastinal lymphadenopathy with suspicion of malignancy. Discussed bronchoscopy as a diagnostic procedure. - Arrange bronchoscopy with biopsy of mediastinal lymph nodes. 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 Smoking cessation achieved. Emphasized importance of continued smoking cessation. - Maintain smoking  cessation.   Tobacco use She stopped after her recent hospitalization.  She denies any dyspnea but she is at risk for COPD.  Encouraged her to continue cessation.  We may decide to perform PFT going forward to assess for degree of obstruction.   Racheal Buddle, MD, PhD 04/16/2024, 11:18 AM [facility] [phone] or if no answer before 7:00PM call [phone] For any issues after 7:00PM please call eLink [phone]

## 2024-04-16 NOTE — Assessment & Plan Note (Signed)
 Buerger's disease Diagnosed with Buerger's disease. Smoking cessation achieved. Emphasized importance of continued smoking cessation. - Maintain smoking cessation.

## 2024-04-16 NOTE — Assessment & Plan Note (Signed)
 Acute pulmonary embolism Acute pulmonary embolism treated with heparin , transitioned to Eliquis . Extended anticoagulation considered due to potential cancer-related etiology. - Continue Eliquis  for at least three months. - Consider extended anticoagulation based on further findings, particularly if we find evidence for malignancy.

## 2024-04-16 NOTE — Assessment & Plan Note (Signed)
 Mediastinal lymphadenopathy Significant mediastinal lymphadenopathy with suspicion of malignancy. Discussed bronchoscopy as a diagnostic procedure. - Arrange bronchoscopy with biopsy of mediastinal lymph nodes. - Coordinate with vascular surgery to manage anticoagulation around the time of biopsy > okay to stop Eliquis  for 2 days prior

## 2024-04-16 NOTE — Progress Notes (Signed)
 Subjective:    Patient ID: [name], female    DOB: [birth year], 63 y.o.   MRN: [id number]  HPI Kristina Howe is a 63 year old female who presents for follow-up of recent hospitalization for right hand digital cyanosis and ischemia.  She was hospitalized from late March to early April due to right hand digital cyanosis and ischemia. During this hospitalization, she underwent evaluation by vascular surgery and required balloon angioplasty and stenting. She was also found to have acute pulmonary embolism with multiple branching intraluminal filling defects on a CT angiogram of the chest performed on March 09, 2024. She underwent stent placement from the innominate artery to the right common carotid and right subclavian arteries.  Her anticoagulation therapy was initially with heparin , which was later converted to Eliquis  at discharge. She is currently on Eliquis , with a plan to continue for at least three months following her vascular surgery follow-up on Apr 09, 2024.  The CT scan of the chest also revealed mediastinal lymphadenopathy and bilateral pulmonary nodules, including a 9 mm right lower lobe spiculated nodule, a 5 mm right upper lobe nodule, and a 15 mm elongated parabronchial left upper lobe nodule. There was pathologic right paratracheal and subcarinal adenopathy up to 4 cm, and circumferential wall thickening of the midesophagus.  She quit smoking on March 18, 2024, after smoking about half a pack a day for 40 years. She denies shortness of breath and is not on any breathing medications. She expresses significant anxiety and emotional distress related to her medical condition.  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 She has been in [place] [place] for over 30 years and has lived locally since 1984. She denies any history of tuberculosis or known exposure to  TB.   RADIOLOGY CT angio chest: Acute pulmonary embolism with multiple branching intraluminal filling defects (03/09/2024) CT chest: Mediastinal lymphadenopathy, bilateral pulmonary nodules: 9 mm right lower lobe spiculated nodule, 5 mm right upper lobe nodule, elongated parabranchial left upper lobe nodule 15 mm, pathologic right paratracheal and subcarinal adenopathy up to 4 cm, circumferential wall thickening of the midesophagus (03/09/2024)   Review of Systems As per HPI  Past Medical History:  Diagnosis Date   Elevated cholesterol    Hypertension   Peripheral vascular disease Pulmonary embolism  No family history on file.   Social History   Socioeconomic History   Marital status: Significant Other    Spouse name: Not on file   Number of children: Not on file   Years of education: Not on file   Highest education level: Not on file  Occupational History   Not on file  Tobacco Use   Smoking status: Former    Types: Cigarettes    Start date: 03/18/2024    Quit date: 03/18/1994    Years since quitting: 30.1   Smokeless tobacco: Not on file   Tobacco comments:    Quit smoking 03/18/2024   Substance and Sexual Activity   Alcohol use: Yes   Drug use: No   Sexual activity: Not on file  Other Topics Concern   Not on file  Social History Narrative   Not on file   Social Drivers of Health   Financial Resource Strain: Not on file  Food Insecurity: Low Risk  (04/09/2024)   Received from [facility]   Hunger Vital Sign    Worried About  Running Out of Food in the Last Year: Never true    Ran Out of Food in the Last Year: Never true  Transportation Needs: No Transportation Needs (04/09/2024)   Received from [facility]    In the past 12 months, has lack of reliable transportation kept you from medical appointments, meetings, work or from getting things needed for daily living? : No  Physical Activity: Not on file  Stress: Not on file  Social  Connections: Not on file  Intimate Partner Violence: Not At Risk (03/08/2024)   Humiliation, Afraid, Rape, and Kick questionnaire    Fear of Current or Ex-Partner: No    Emotionally Abused: No    Physically Abused: No    Sexually Abused: No     No Known Allergies   Outpatient Medications Prior to Visit  Medication Sig Dispense Refill   ALPRAZolam  (XANAX ) 0.5 MG tablet Take 0.5 mg by mouth at bedtime.     apixaban  (ELIQUIS ) 5 MG TABS tablet Take 1 tablet (5 mg total) by mouth 2 (two) times daily. 60 tablet 1   APIXABAN  (ELIQUIS ) VTE STARTER PACK (10MG  AND 5MG ) Take as directed on package: start with two-5mg  tablets twice daily for 7 days. On day 8, switch to one-5mg  tablet twice daily. 74 each 0   atorvastatin  (LIPITOR) 40 MG tablet Take 1 tablet (40 mg total) by mouth daily. 30 tablet 0   magnesium 30 MG tablet Take 30 mg by mouth 2 (two) times daily.     Multiple Vitamins-Minerals (MULTIVITAMIN WITH MINERALS) tablet Take 1 tablet by mouth daily.     omega-3 acid ethyl esters (LOVAZA) 1 g capsule Take 2 g by mouth daily.     tiZANidine (ZANAFLEX) 2 MG tablet Take 2 mg by mouth every 6 (six) hours as needed.     triamterene-hydrochlorothiazide (MAXZIDE-25) 37.5-25 MG per tablet Take 1 tablet by mouth daily.       valsartan (DIOVAN) 320 MG tablet Take 320 mg by mouth daily.     citalopram  (CELEXA ) 20 MG tablet Take 1 tablet by mouth daily.     naloxone (NARCAN) nasal spray 4 mg/0.1 mL Place 0.4 mg into the nose once. (Patient not taking: Reported on 04/16/2024)     NIFEdipine  (ADALAT  CC) 30 MG 24 hr tablet Take 1 tablet (30 mg total) by mouth daily. 30 tablet 0   No facility-administered medications prior to visit.         Objective:   Physical Exam Today's Vitals   04/16/24 1019  BP: 125/83  Pulse: (!) 101  SpO2: 96%  Weight: 110 lb 12.8 oz (50.3 kg)  Height: 5\' 7"  (1.702 m)   Body mass index is 17.35 kg/m.;  Gen: Pleasant, thin, in no distress,  normal affect  ENT: No  lesions,  mouth clear,  oropharynx clear, no postnasal drip  Neck: No JVD, no stridor  Lungs: No use of accessory muscles, distant but no crackles or wheezing on normal respiration, no wheeze on forced expiration  Cardiovascular: RRR, heart sounds normal, no murmur or gallops, no peripheral edema  Musculoskeletal: No deformities, no cyanosis or clubbing  Neuro: alert, awake, non focal  Skin: Warm, no lesions or rash.  Her right hand is in a glove       Assessment & Plan:   Acute pulmonary embolism (HCC) Acute pulmonary embolism Acute pulmonary embolism treated with heparin , transitioned to Eliquis . Extended anticoagulation considered due to potential cancer-related etiology. - Continue Eliquis  for at  least three months. - Consider extended anticoagulation based on further findings, particularly if we find evidence for malignancy.   Pulmonary nodules Pulmonary nodules Multiple bilateral pulmonary nodules identified. Differential includes lung cancer and benign causes.  Consider also pulmonary infarcts in the setting of her PE at the time of her hospitalization.  I think we need to reimage to establish whether the nodules are still present.  If so then we may be able to navigate to these during bronchoscopy - Order repeat CT scan of the chest without contrast. - Consider bronchoscopy with biopsy if nodules persist and are accessible.    Mediastinal adenopathy Mediastinal lymphadenopathy Significant mediastinal lymphadenopathy with suspicion of malignancy. Discussed bronchoscopy as a diagnostic procedure. - Arrange bronchoscopy with biopsy of mediastinal lymph nodes. 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 Smoking cessation achieved. Emphasized importance of continued smoking cessation. - Maintain smoking  cessation.   Tobacco use She stopped after her recent hospitalization.  She denies any dyspnea but she is at risk for COPD.  Encouraged her to continue cessation.  We may decide to perform PFT going forward to assess for degree of obstruction.   Racheal Buddle, MD, PhD 04/16/2024, 11:18 AM [facility] [phone] or if no answer before 7:00PM call [phone] For any issues after 7:00PM please call eLink [phone]

## 2024-04-16 NOTE — Assessment & Plan Note (Signed)
 Pulmonary nodules Multiple bilateral pulmonary nodules identified. Differential includes lung cancer and benign causes.  Consider also pulmonary infarcts in the setting of her PE at the time of her hospitalization.  I think we need to reimage to establish whether the nodules are still present.  If so then we may be able to navigate to these during bronchoscopy - Order repeat CT scan of the chest without contrast. - Consider bronchoscopy with biopsy if nodules persist and are accessible.

## 2024-04-16 NOTE — Patient Instructions (Addendum)
 VISIT SUMMARY:  You had a follow-up appointment today to discuss your recent hospitalization for right hand digital cyanosis and ischemia. We reviewed your treatment, including the procedures you underwent and your current medications. We also discussed the findings from your recent CT scan and your ongoing symptoms and concerns.  YOUR PLAN:  -ACUTE PULMONARY EMBOLISM: An acute pulmonary embolism is a sudden blockage in a lung artery, usually due to a blood clot. You were treated with heparin  in the hospital and transitioned to Eliquis , which you will continue for at least three months. We may consider extending this treatment based on further findings.  -PULMONARY NODULES: Pulmonary nodules are small growths in the lungs that can be benign or cancerous. We found multiple nodules in your lungs, and we will order a repeat CT scan of your chest without contrast. If the nodules persist and are accessible, we may consider biopsy as part of your bronchoscopy.  -MEDIASTINAL LYMPHADENOPATHY: Mediastinal lymphadenopathy is the enlargement of lymph nodes in the area between your lungs, which can be a sign of cancer. We will arrange a bronchoscopy with a biopsy of these lymph nodes to get a clearer diagnosis. We have coordinated with vascular surgery to manage your anticoagulation around the time of the biopsy.  The plan at this time is for you to stop your Eliquis  2 days prior to the scheduled procedure date.  You will need a designated driver and someone to watch you at home that day after the procedure.  -BUERGER'S DISEASE: Buerger's disease is a rare disease of the arteries and veins in the arms and legs, often associated with smoking. You have successfully quit smoking, and it is crucial to maintain this cessation to manage your condition.  -HYPERTENSION: Hypertension is high blood pressure. We will continue to monitor and manage your blood pressure as part of your overall health care.  -ANXIETY: Anxiety  is a feeling of worry or fear that can affect your emotional well-being. We acknowledge your significant anxiety related to your medical condition and will continue to support you in managing this.   Follow-up with our office about 1 week after your bronchoscopy so we can review results.  Also follow with Dr. Baldwin Levee in about 2 months so that we can discuss how long you need to be on your Eliquis .  Please call sooner if you have any problems.

## 2024-04-16 NOTE — Assessment & Plan Note (Signed)
 She stopped after her recent hospitalization.  She denies any dyspnea but she is at risk for COPD.  Encouraged her to continue cessation.  We may decide to perform PFT going forward to assess for degree of obstruction.

## 2024-04-19 ENCOUNTER — Telehealth: Payer: Self-pay

## 2024-04-19 ENCOUNTER — Other Ambulatory Visit: Payer: Self-pay

## 2024-04-19 ENCOUNTER — Ambulatory Visit (HOSPITAL_COMMUNITY)
Admission: RE | Admit: 2024-04-19 | Discharge: 2024-04-19 | Disposition: A | Discharge status: Home / Self Care | Source: Ambulatory Visit | Attending: Surgery | Admitting: Surgery

## 2024-04-19 ENCOUNTER — Ambulatory Visit: Attending: Surgery | Admitting: Physician Assistant

## 2024-04-19 VITALS — BP 126/82 | HR 90 | Temp 98.1°F | Wt 109.6 lb

## 2024-04-19 DIAGNOSIS — I771 Stricture of artery: Secondary | ICD-10-CM

## 2024-04-19 DIAGNOSIS — I808 Phlebitis and thrombophlebitis of other sites: Secondary | ICD-10-CM

## 2024-04-19 HISTORY — DX: Stricture of artery: I77.1

## 2024-04-19 MED ORDER — NIFEDIPINE ER 30 MG PO TB24: 30.0000 mg | ORAL_TABLET | Freq: Every day | ORAL | 0 refills | Status: DC

## 2024-04-19 NOTE — Telephone Encounter (Signed)
 Triage: -pt called tearful stating her "vein in her arm is hard", "finger is purple"  -MD notified & ordered RUA duplex   -booked for today

## 2024-04-19 NOTE — Progress Notes (Signed)
 Office Note     CC:  follow up Requesting Provider:  No ref. provider found  HPI: Kristina Howe is a 63 y.o. (1961-09-10) female who presents to clinic as an urgent triage add-on due to right forearm pain as well as purple discoloration of her right index finger.  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  She was last seen in the office by Dr. Susi Eric on 04/09/2024 with a CTA of the chest and right arm demonstrating widely patent stents.  She developed severe pain in her right forearm and palpable cords over the past several days.  She states she has been taking her Eliquis  daily.  She ran out of her 81 mg aspirin .  She believes the discoloration of her right index finger has been slightly worse since discontinuing nifedipine  but fortunately has not experiencing any pain in her fingers or hand.   Past Medical History:  Diagnosis Date   Elevated cholesterol    Hypertension     Past Surgical History:  Procedure Laterality Date   ABDOMINAL HYSTERECTOMY     AORTOGRAM N/A 03/11/2024   Procedure: AORTIC ARCH ANGIOGRAM;  Surgeon: Philipp Brawn, MD;  Location: [facility];  Service: Vascular;  Laterality: N/A;   INSERTION OF RETROGRADE CAROTID STENT Right 03/11/2024   Procedure: INSERTION RIGHT SUBCLAVIAN ARTERY AND RIGHT COMMON CAROTID ARTERY STENTS;  Surgeon: Philipp Brawn, MD;  Location: [facility] OR;  Service: Vascular;  Laterality: Right;   THORACIC EXPOSURE Right 03/11/2024   Procedure: DIRECT EXPOSURE OF RIGHT COMMON CAROTID ARTERY;  Surgeon: Philipp Brawn, MD;  Location[facility] OR;  Service: Vascular;  Laterality: Right;   ULTRASOUND GUIDANCE FOR VASCULAR ACCESS Right 03/11/2024   Procedure: ULTRASOUND GUIDANCE, FOR VASCULAR ACCESS, RIGHT RADIAL ARTERY AND RIGHT FEMORAL ARTERY;  Surgeon: Philipp Brawn, MD;  Location: [facility] OR;  Service:  Vascular;  Laterality: Right;   UPPER EXTREMITY ANGIOGRAM Right 03/11/2024   Procedure: [employer], RIGHT UPPER EXTREMITY;  Surgeon: Philipp Brawn, MD;  Location: [facility] OR;  Service: Vascular;  Laterality: Right;    Social History   Socioeconomic History   Marital status: Significant Other    Spouse name: Not on file   Number of children: Not on file   Years of education: Not on file   Highest education level: Not on file  Occupational History   Not on file  Tobacco Use   Smoking status: Former    Types: Cigarettes    Start date: 03/18/2024    Quit date: 03/18/1994    Years since quitting: 30.1   Smokeless tobacco: Not on file   Tobacco comments:    Quit smoking 03/18/2024   Substance and Sexual Activity   Alcohol use: Yes   Drug use: No   Sexual activity: Not on file  Other Topics Concern   Not on file  Social History Narrative   Not on file   Social Drivers of Health   Financial Resource Strain: Not on file  Food Insecurity: Low Risk  (04/09/2024)   Received from [facility]   Hunger Vital Sign    Worried About Running Out of Food in the Last Year: Never true    Ran Out of Food in the Last Year: Never true  Transportation Needs: No Transportation Needs (04/09/2024)   Received from [facility]  In the past 12 months, has lack of reliable transportation kept you from medical appointments, meetings, work or from getting things needed for daily living? : No  Physical Activity: Not on file  Stress: Not on file  Social Connections: Not on file  Intimate Partner Violence: Not At Risk (03/08/2024)   Humiliation, Afraid, Rape, and Kick questionnaire    Fear of Current or Ex-Partner: No    Emotionally Abused: No    Physically Abused: No    Sexually Abused: No   No family history on file.  Current Outpatient Medications  Medication Sig Dispense Refill   ALPRAZolam  (XANAX ) 0.5 MG tablet Take 0.5 mg by mouth at bedtime.     apixaban  (ELIQUIS ) 5 MG TABS  tablet Take 1 tablet (5 mg total) by mouth 2 (two) times daily. 60 tablet 1   APIXABAN  (ELIQUIS ) VTE STARTER PACK (10MG  AND 5MG ) Take as directed on package: start with two-5mg  tablets twice daily for 7 days. On day 8, switch to one-5mg  tablet twice daily. 74 each 0   atorvastatin  (LIPITOR) 40 MG tablet Take 1 tablet (40 mg total) by mouth daily. 30 tablet 0   citalopram  (CELEXA ) 20 MG tablet Take 1 tablet by mouth daily.     magnesium 30 MG tablet Take 30 mg by mouth 2 (two) times daily.     Multiple Vitamins-Minerals (MULTIVITAMIN WITH MINERALS) tablet Take 1 tablet by mouth daily.     naloxone (NARCAN) nasal spray 4 mg/0.1 mL Place 0.4 mg into the nose once. (Patient not taking: Reported on 04/16/2024)     NIFEdipine  (ADALAT  CC) 30 MG 24 hr tablet Take 1 tablet (30 mg total) by mouth daily. 30 tablet 0   omega-3 acid ethyl esters (LOVAZA) 1 g capsule Take 2 g by mouth daily.     tiZANidine (ZANAFLEX) 2 MG tablet Take 2 mg by mouth every 6 (six) hours as needed.     triamterene-hydrochlorothiazide (MAXZIDE-25) 37.5-25 MG per tablet Take 1 tablet by mouth daily.       valsartan (DIOVAN) 320 MG tablet Take 320 mg by mouth daily.     No current facility-administered medications for this visit.    No Known Allergies   REVIEW OF SYSTEMS:   [X]  denotes positive finding, [ ]  denotes negative finding Cardiac  Comments:  Chest pain or chest pressure:    Shortness of breath upon exertion:    Short of breath when lying flat:    Irregular heart rhythm:        Vascular    Pain in calf, thigh, or hip brought on by ambulation:    Pain in feet at night that wakes you up from your sleep:     Blood clot in your veins:    Leg swelling:         Pulmonary    Oxygen at home:    Productive cough:     Wheezing:         Neurologic    Sudden weakness in arms or legs:     Sudden numbness in arms or legs:     Sudden onset of difficulty speaking or slurred speech:    Temporary loss of vision in one  eye:     Problems with dizziness:         Gastrointestinal    Blood in stool:     Vomited blood:         Genitourinary    Burning when urinating:     Blood  in urine:        Psychiatric    Major depression:         Hematologic    Bleeding problems:    Problems with blood clotting too easily:        Skin    Rashes or ulcers:        Constitutional    Fever or chills:      PHYSICAL EXAMINATION:  Vitals:   04/19/24 1104  BP: 126/82  Pulse: 90  Temp: 98.1 F (36.7 C)  TempSrc: Temporal  SpO2: 93%  Weight: 109 lb 9.6 oz (49.7 kg)    General:  WDWN in NAD; vital signs documented above Gait: Not observed HENT: WNL, normocephalic Pulmonary: normal non-labored breathing , without Rales, rhonchi,  wheezing Cardiac: regular HR Abdomen: soft, NT, no masses Skin: without rashes Vascular Exam/Pulses: palpable R ulnar pulse Extremities: Painful palpable cords right forearm with redness; purple discoloration right index finger with cracking skin at the fingertip Musculoskeletal: no muscle wasting or atrophy  Neurologic: A&O X 3 Psychiatric:  The pt has Normal affect.   Non-Invasive Vascular Imaging:   Right arm arterial duplex demonstrates a widely patent axillary artery, brachial artery, ulnar artery.  At the wrist the radial artery is retrograde.  Radial artery changes to antegrade with compression of the ulnar artery.    ASSESSMENT/PLAN:: 63 y.o. female returns to clinic as an urgent add-on due to right forearm pain and purple discoloration of the right index finger  Ms. Lorio is a 63 year old female status post double barrel innominate and subclavian stenting as well as carotid stenting by Dr. Susi Eric due to an stenosis with distal embolization to the right hand.  On exam she is without pain in her right hand.  She has a 2+ palpable ulnar pulse.  Duplex demonstrates patent ulnar and radial arteries as well as patent axillary and brachial arteries.  She reports some  increased purplish discoloration in her index finger.  We will resume her nifedipine  which seem to help however was discontinued at her last office visit.  The patient is more concerned about the severe pain in her forearm.  She has thrombophlebitis of her superficial basilic forearm veins as well as a thrombosed basilic vein in her upper arm.  This represents superficial thrombophlebitis.  She will use a warm compress to the painful areas.  I also recommended an over-the-counter anti-inflammatory cream.  I asked her to purchase 81 mg aspirin  at the onsite pharmacy to take in addition to her Eliquis .  She will notify the office if these conservative measures recommended above do not help with her symptoms.  She will otherwise return in 6 months with a carotid and right arm duplex per Dr. Susi Eric.   Cordie Deters, PA-C [facility] [phone]  Clinic MD:   Charlotte Cookey

## 2024-04-21 ENCOUNTER — Ambulatory Visit
Admission: RE | Admit: 2024-04-21 | Discharge: 2024-04-21 | Disposition: A | Discharge status: Home / Self Care | Source: Ambulatory Visit | Attending: Emergency Medicine | Admitting: Emergency Medicine

## 2024-04-21 ENCOUNTER — Other Ambulatory Visit: Payer: Self-pay

## 2024-04-21 ENCOUNTER — Encounter (HOSPITAL_COMMUNITY): Payer: Self-pay | Admitting: Emergency Medicine

## 2024-04-21 DIAGNOSIS — R918 Other nonspecific abnormal finding of lung field: Secondary | ICD-10-CM | POA: Diagnosis not present

## 2024-04-21 DIAGNOSIS — R590 Localized enlarged lymph nodes: Secondary | ICD-10-CM | POA: Diagnosis not present

## 2024-04-21 DIAGNOSIS — J439 Emphysema, unspecified: Secondary | ICD-10-CM | POA: Diagnosis not present

## 2024-04-21 NOTE — Progress Notes (Signed)
 error

## 2024-04-22 ENCOUNTER — Other Ambulatory Visit: Payer: Self-pay

## 2024-04-22 ENCOUNTER — Encounter (HOSPITAL_COMMUNITY): Payer: Self-pay | Admitting: Emergency Medicine

## 2024-04-22 ENCOUNTER — Telehealth: Payer: Self-pay

## 2024-04-22 NOTE — Telephone Encounter (Signed)
 Thank you for lettiing me know, we will work to reassure her on date of procedure as well

## 2024-04-22 NOTE — Telephone Encounter (Signed)
 Copied from CRM [id number]. Topic: Clinical - Medical Advice >> Apr 22, 2024  8:30 AM Crist Dominion wrote: Reason for CRM: Patient states she spoke with a nurse yesterday regarding her bronchoscopy, but the nurse was very rushed and wasn't being clear and the patient states she feels more worried than before the nurse called her. Patient is requesting to speak with a different nurse of Dr. Georgiana Kirks in hopes that they can provide some clarity on what this procedure entails.  I will route to Dr Lacinda Pica nurse, Melodee Spruce. CMA to elaborate more on the bronch.

## 2024-04-22 NOTE — Progress Notes (Signed)
 PCP -  Garland Junk, FNP  Cardiologist -   PPM/ICD - Denies Device Orders - n/a Rep Notified - n/a  Chest x-ray -  Chest CT 04-21-24 EKG - 03-07-24 Stress Test -  ECHO - 03-08-24 Cardiac Cath -   CPAP -  DM- denies  Blood Thinner Instructions: apixaban  (ELIQUIS ) patient to hold x 2 days per instructions from Dr. Verona Goodwill Aspirin  Instructions: Awaiting return call for MD office for further instructions  ERAS Protcol - NPO  COVID TEST- n/a  Anesthesia review: no  Patient verbally denies any shortness of breath, fever, cough and chest pain during phone call   -------------  SDW INSTRUCTIONS given:  Your procedure is scheduled on Apr 26, 2024.  Report to [facility] Main Entrance "A" at 9:00 A.M., and check in at the Admitting office.  Call this number if you have problems the morning of surgery:  [phone]   Remember:  Do not eat or drink after midnight the night before your surgery      Take these medicines the morning of surgery with A SIP OF WATER  atorvastatin  (LIPITOR)  FLUoxetine (PROZAC)  NIFEdipine  (ADALAT  CC)   As of today, STOP taking any Aspirin  (unless otherwise instructed by your surgeon) Aleve, Naproxen, Ibuprofen, Motrin, Advil, Goody's, BC's, all herbal medications, fish oil, and all vitamins.                      Do not wear jewelry, make up, or nail polish            Do not wear lotions, powders, perfumes/colognes, or deodorant.            Do not shave 48 hours prior to surgery.  Men may shave face and neck.            Do not bring valuables to the hospital.            [facility] is not responsible for any belongings or valuables.  Do NOT Smoke (Tobacco/Vaping) 24 hours prior to your procedure If you use a CPAP at night, you may bring all equipment for your overnight stay.   Contacts, glasses, dentures or bridgework may not be worn into surgery.      For patients admitted to the hospital, discharge time will be determined by your treatment  team.   Patients discharged the day of surgery will not be allowed to drive home, and someone needs to stay with them for 24 hours.    Special instructions:   [place]- Preparing For Surgery  Before surgery, you can play an important role. Because skin is not sterile, your skin needs to be as free of germs as possible. You can reduce the number of germs on your skin by washing with CHG (chlorahexidine gluconate) Soap before surgery.  CHG is an antiseptic cleaner which kills germs and bonds with the skin to continue killing germs even after washing.    Oral Hygiene is also important to reduce your risk of infection.  Remember - BRUSH YOUR TEETH THE MORNING OF SURGERY WITH YOUR REGULAR TOOTHPASTE  Please do not use if you have an allergy to CHG or antibacterial soaps. If your skin becomes reddened/irritated stop using the CHG.  Do not shave (including legs and underarms) for at least 48 hours prior to first CHG shower. It is OK to shave your face.  Please follow these instructions carefully.   Shower the NIGHT BEFORE SURGERY and the MORNING OF SURGERY  with DIAL Soap.   Pat yourself dry with a CLEAN TOWEL.  Wear CLEAN PAJAMAS to bed the night before surgery  Place CLEAN SHEETS on your bed the night of your first shower and DO NOT SLEEP WITH PETS.   Day of Surgery: Please shower morning of surgery  Wear Clean/Comfortable clothing the morning of surgery Do not apply any deodorants/lotions.   Remember to brush your teeth WITH YOUR REGULAR TOOTHPASTE.   Questions were answered. Patient verbalized understanding of instructions.

## 2024-04-22 NOTE — Telephone Encounter (Signed)
 Called and spoke with Blanca Bunch, she was crying when answering call. Pt states someone called her at 6pm yesterday. Pt spoke with pre-op nurse and states she was very rude and made patient upset about Bronch procedure. Pt had concerns regarding her right arm being used since her veins are hard and sore, using Voltaren cream.  Muscle in left arm is sore above the elbow and was told by pre op nurse it would have to be used anyways for IV. Pt got very upset about this and I advised pt her hand can also be used if it is not sore. Pt then calmed down after hearing that news as her hand is not sore. I advised pt with information about the Bronchoscopy procedure and answered questions she had. Pt calmed down and was no longer crying during our conversation after reassuring her about the procedure and her questions. Pt does have anxiety.  FYI Dr. Baldwin Levee pt is having biopsy on 5/19.

## 2024-04-23 ENCOUNTER — Telehealth: Payer: Self-pay

## 2024-04-23 NOTE — Progress Notes (Signed)
 Message left at office regarding Tyson Gals instruction for patient's upcoming procedure

## 2024-04-23 NOTE — Telephone Encounter (Signed)
 Copied from CRM [id number]. Topic: Clinical - Medication Question >> Apr 23, 2024  9:12 AM Isabell A wrote: Reason for CRM: Bridgette Campus from [facility] pre-admit dept would like to confirm if patient can take Aspirin  or does she need to hold it due to upcoming procedure on 5/19.   Callback number: [phone]   -Spoke with CAL, stated to send high priority message.   I will route to Dr Baldwin Levee to advise

## 2024-04-23 NOTE — Progress Notes (Signed)
 No return call from surgeon's office. Follow up call made to patient patients states "I will not take asa since I have to stop other blood thinner medication

## 2024-04-24 NOTE — Telephone Encounter (Signed)
 Prefer that ASA is held x 2 days (usually pt's informed of this by our PCC's), but acceptable if she has taken it

## 2024-04-26 ENCOUNTER — Ambulatory Visit (HOSPITAL_COMMUNITY): Admitting: Anesthesiology

## 2024-04-26 ENCOUNTER — Encounter (HOSPITAL_COMMUNITY)
Admission: RE | Disposition: A | Discharge status: Home / Self Care | Payer: Self-pay | Source: Home / Self Care | Attending: Emergency Medicine

## 2024-04-26 ENCOUNTER — Other Ambulatory Visit: Payer: Self-pay

## 2024-04-26 ENCOUNTER — Ambulatory Visit (HOSPITAL_BASED_OUTPATIENT_CLINIC_OR_DEPARTMENT_OTHER): Admitting: Anesthesiology

## 2024-04-26 ENCOUNTER — Ambulatory Visit (HOSPITAL_COMMUNITY)

## 2024-04-26 ENCOUNTER — Ambulatory Visit (HOSPITAL_COMMUNITY)
Admission: RE | Admit: 2024-04-26 | Discharge: 2024-04-26 | Disposition: A | Discharge status: Home / Self Care | Attending: Emergency Medicine | Admitting: Emergency Medicine

## 2024-04-26 ENCOUNTER — Encounter (HOSPITAL_COMMUNITY): Payer: Self-pay | Admitting: Emergency Medicine

## 2024-04-26 ENCOUNTER — Other Ambulatory Visit: Payer: Self-pay | Admitting: Emergency Medicine

## 2024-04-26 ENCOUNTER — Encounter: Payer: Self-pay | Admitting: Emergency Medicine

## 2024-04-26 DIAGNOSIS — C3401 Malignant neoplasm of right main bronchus: Secondary | ICD-10-CM | POA: Insufficient documentation

## 2024-04-26 DIAGNOSIS — C771 Secondary and unspecified malignant neoplasm of intrathoracic lymph nodes: Secondary | ICD-10-CM | POA: Diagnosis not present

## 2024-04-26 DIAGNOSIS — Z86711 Personal history of pulmonary embolism: Secondary | ICD-10-CM | POA: Diagnosis not present

## 2024-04-26 DIAGNOSIS — Z79899 Other long term (current) drug therapy: Secondary | ICD-10-CM | POA: Diagnosis not present

## 2024-04-26 DIAGNOSIS — C3402 Malignant neoplasm of left main bronchus: Secondary | ICD-10-CM | POA: Insufficient documentation

## 2024-04-26 DIAGNOSIS — Z87891 Personal history of nicotine dependence: Secondary | ICD-10-CM

## 2024-04-26 DIAGNOSIS — Z7901 Long term (current) use of anticoagulants: Secondary | ICD-10-CM | POA: Insufficient documentation

## 2024-04-26 DIAGNOSIS — C801 Malignant (primary) neoplasm, unspecified: Secondary | ICD-10-CM | POA: Diagnosis not present

## 2024-04-26 DIAGNOSIS — R590 Localized enlarged lymph nodes: Secondary | ICD-10-CM | POA: Diagnosis present

## 2024-04-26 DIAGNOSIS — Z48813 Encounter for surgical aftercare following surgery on the respiratory system: Secondary | ICD-10-CM | POA: Diagnosis not present

## 2024-04-26 DIAGNOSIS — I739 Peripheral vascular disease, unspecified: Secondary | ICD-10-CM | POA: Insufficient documentation

## 2024-04-26 DIAGNOSIS — R918 Other nonspecific abnormal finding of lung field: Secondary | ICD-10-CM | POA: Diagnosis not present

## 2024-04-26 DIAGNOSIS — I10 Essential (primary) hypertension: Secondary | ICD-10-CM | POA: Insufficient documentation

## 2024-04-26 DIAGNOSIS — R911 Solitary pulmonary nodule: Secondary | ICD-10-CM | POA: Diagnosis not present

## 2024-04-26 DIAGNOSIS — I731 Thromboangiitis obliterans [Buerger's disease]: Secondary | ICD-10-CM | POA: Insufficient documentation

## 2024-04-26 DIAGNOSIS — J439 Emphysema, unspecified: Secondary | ICD-10-CM | POA: Diagnosis not present

## 2024-04-26 DIAGNOSIS — Z95828 Presence of other vascular implants and grafts: Secondary | ICD-10-CM | POA: Insufficient documentation

## 2024-04-26 DIAGNOSIS — F419 Anxiety disorder, unspecified: Secondary | ICD-10-CM | POA: Insufficient documentation

## 2024-04-26 DIAGNOSIS — C3431 Malignant neoplasm of lower lobe, right bronchus or lung: Secondary | ICD-10-CM | POA: Diagnosis not present

## 2024-04-26 HISTORY — DX: Anxiety disorder, unspecified: F41.9

## 2024-04-26 HISTORY — PX: BRONCHIAL BRUSHINGS: SHX5108

## 2024-04-26 HISTORY — PX: BRONCHIAL WASHINGS: SHX5105

## 2024-04-26 HISTORY — PX: BRONCHIAL BIOPSY: SHX5109

## 2024-04-26 HISTORY — PX: BRONCHIAL NEEDLE ASPIRATION BIOPSY: SHX5106

## 2024-04-26 HISTORY — PX: BRONCHOSCOPY, WITH BIOPSY USING ELECTROMAGNETIC NAVIGATION: SHX7536

## 2024-04-26 HISTORY — PX: VIDEO BRONCHOSCOPY WITH ENDOBRONCHIAL ULTRASOUND: SHX6177

## 2024-04-26 LAB — BASIC METABOLIC PANEL WITH GFR
Anion gap: 13 (ref 5–15)
BUN: 16 mg/dL (ref 8–23)
CO2: 22 mmol/L (ref 22–32)
Calcium: 9.9 mg/dL (ref 8.9–10.3)
Chloride: 105 mmol/L (ref 98–111)
Creatinine, Ser: 0.87 mg/dL (ref 0.44–1.00)
GFR, Estimated: >60 mL/min (ref >60)
Glucose, Bld: 100 mg/dL — ABNORMAL HIGH (ref 70–99)
Potassium: 4.1 mmol/L (ref 3.5–5.1)
Sodium: 140 mmol/L (ref 135–145)

## 2024-04-26 SURGERY — BRONCHOSCOPY, WITH EBUS
Anesthesia: General | Laterality: Right

## 2024-04-26 MED ORDER — OXYCODONE HCL 5 MG PO TABS: 5.0000 mg | ORAL_TABLET | Freq: Once | ORAL | Status: DC | PRN

## 2024-04-26 MED ORDER — PROPOFOL 500 MG/50ML IV EMUL
INTRAVENOUS | Status: DC | PRN
  Administered 2024-04-26: 100 ug/kg/min via INTRAVENOUS

## 2024-04-26 MED ORDER — LIDOCAINE 2% (20 MG/ML) 5 ML SYRINGE
INTRAMUSCULAR | Status: DC | PRN
  Administered 2024-04-26: 20 mg via INTRAVENOUS

## 2024-04-26 MED ORDER — SUGAMMADEX SODIUM 200 MG/2ML IV SOLN
INTRAVENOUS | Status: DC | PRN
  Administered 2024-04-26: 200 mg via INTRAVENOUS

## 2024-04-26 MED ORDER — LACTATED RINGERS IV SOLN: INTRAVENOUS | Status: DC | PRN

## 2024-04-26 MED ORDER — EPINEPHRINE 1 MG/10ML IJ SOSY
PREFILLED_SYRINGE | INTRAMUSCULAR | Status: DC | PRN
  Administered 2024-04-26: .6 mg via ENDOTRACHEOPULMONARY

## 2024-04-26 MED ORDER — LACTATED RINGERS IV SOLN: INTRAVENOUS | Status: DC

## 2024-04-26 MED ORDER — PROPOFOL 10 MG/ML IV BOLUS
INTRAVENOUS | Status: DC | PRN
  Administered 2024-04-26: 120 mg via INTRAVENOUS

## 2024-04-26 MED ORDER — DEXAMETHASONE SODIUM PHOSPHATE 10 MG/ML IJ SOLN
INTRAMUSCULAR | Status: DC | PRN
  Administered 2024-04-26: 10 mg via INTRAVENOUS

## 2024-04-26 MED ORDER — CHLORHEXIDINE GLUCONATE 0.12 % MT SOLN
15.0000 mL | Freq: Once | OROMUCOSAL | Status: AC
  Administered 2024-04-26: 15 mL via OROMUCOSAL
  Filled 2024-04-26: qty 15

## 2024-04-26 MED ORDER — MIDAZOLAM HCL 2 MG/2ML IJ SOLN: 0.5000 mg | Freq: Once | INTRAMUSCULAR | Status: DC | PRN

## 2024-04-26 MED ORDER — ROCURONIUM BROMIDE 10 MG/ML (PF) SYRINGE
PREFILLED_SYRINGE | INTRAVENOUS | Status: DC | PRN
  Administered 2024-04-26: 60 mg via INTRAVENOUS
  Administered 2024-04-26: 10 mg via INTRAVENOUS

## 2024-04-26 MED ORDER — MEPERIDINE HCL 25 MG/ML IJ SOLN
6.2500 mg | INTRAMUSCULAR | Status: DC | PRN
Start: 2024-04-26 — End: 2024-04-26

## 2024-04-26 MED ORDER — EPINEPHRINE 1 MG/10ML IJ SOSY
PREFILLED_SYRINGE | INTRAMUSCULAR | Status: AC
  Filled 2024-04-26: qty 10

## 2024-04-26 MED ORDER — OXYCODONE HCL 5 MG/5ML PO SOLN: 5.0000 mg | Freq: Once | ORAL | Status: DC | PRN

## 2024-04-26 MED ORDER — FENTANYL CITRATE (PF) 250 MCG/5ML IJ SOLN
INTRAMUSCULAR | Status: DC | PRN
Start: 2024-04-26 — End: 2024-04-26
  Administered 2024-04-26: 100 ug via INTRAVENOUS

## 2024-04-26 MED ORDER — ACETAMINOPHEN 500 MG PO TABS
1000.0000 mg | ORAL_TABLET | Freq: Once | ORAL | Status: AC
  Administered 2024-04-26: 1000 mg via ORAL
  Filled 2024-04-26: qty 2

## 2024-04-26 MED ORDER — FENTANYL CITRATE (PF) 100 MCG/2ML IJ SOLN
INTRAMUSCULAR | Status: AC
  Filled 2024-04-26: qty 2

## 2024-04-26 MED ORDER — FENTANYL CITRATE (PF) 100 MCG/2ML IJ SOLN: 25.0000 ug | INTRAMUSCULAR | Status: DC | PRN

## 2024-04-26 MED ORDER — ONDANSETRON HCL 4 MG/2ML IJ SOLN
INTRAMUSCULAR | Status: DC | PRN
  Administered 2024-04-26: 4 mg via INTRAVENOUS

## 2024-04-26 NOTE — Interval H&P Note (Signed)
 History and Physical Interval Note:  04/26/2024 9:25 AM  Kristina Howe  has presented today for surgery, with the diagnosis of Mediastinal adenopathy.  The various methods of treatment have been discussed with the patient and family. After consideration of risks, benefits and other options for treatment, the patient has consented to  Procedure(s) with comments: BRONCHOSCOPY, WITH EBUS (Right) - Possible robotic navigation as well depending on CT results BRONCHOSCOPY, WITH BIOPSY USING ELECTROMAGNETIC NAVIGATION (Right) as a surgical intervention.  The patient's history has been reviewed, patient examined, no change in status, stable for surgery.  I have reviewed the patient's chart and labs.  Questions were answered to the patient's satisfaction.     Denson Flake

## 2024-04-26 NOTE — Transfer of Care (Signed)
 Immediate Anesthesia Transfer of Care Note  Patient: Kristina Howe  Procedure(s) Performed: BRONCHOSCOPY, WITH EBUS (Right) BRONCHOSCOPY, WITH BIOPSY USING ELECTROMAGNETIC NAVIGATION (Right)  Patient Location: PACU  Anesthesia Type:General  Level of Consciousness: awake, alert , oriented, and patient cooperative  Airway & Oxygen Therapy: Patient Spontanous Breathing and Patient connected to face mask oxygen  Post-op Assessment: Report given to RN, Post -op Vital signs reviewed and stable, Patient moving all extremities, Patient moving all extremities X 4, and Patient able to stick tongue midline  Post vital signs: Reviewed and stable  Last Vitals:  Vitals Value Taken Time  BP    Temp    Pulse    Resp    SpO2      Last Pain:  Vitals:   04/26/24 0929  TempSrc:   PainSc: 0-No pain         Complications: No notable events documented.

## 2024-04-26 NOTE — Progress Notes (Signed)
 Referral made to see Dr. Marguerita Shih with hematology oncology

## 2024-04-26 NOTE — Discharge Instructions (Signed)
 Flexible Bronchoscopy, Care After This sheet gives you information about how to care for yourself after your test. Your doctor may also give you more specific instructions. If you have problems or questions, contact your doctor. Follow these instructions at home: Eating and drinking When you are wide awake, your numbness is gone and your cough and gag reflexes have come back, you may: Start eating only soft foods. Slowly drink liquids. Six hours after the test, go back to your normal diet. Driving Do not drive for 24 hours if you were given a medicine to help you relax (sedative). Do not drive or use heavy machinery while taking prescription pain medicine. General instructions Take over-the-counter and prescription medicines only as told by your doctor. Return to your normal activities as told. Ask what activities are safe for you. Do not use any products that have nicotine or tobacco in them. This includes cigarettes and e-cigarettes. If you need help quitting, ask your doctor. Keep all follow-up visits as told by your doctor. This is important. It is very important if you had a tissue sample (biopsy) taken. Get help right away if: You have shortness of breath that gets worse. You get light-headed. You feel like you are going to pass out (faint). You have chest pain. You cough up: More than a little blood. More blood than before. Summary Do not use cigarettes. Do not use e-cigarettes. Seek care in the Emergency Department right away if you have chest pain or shortness of breath. Call or MyChart Message our office for any questions or problems at [phone].  Okay to restart Eliquis  and aspirin  on 04/27/2024   This information is not intended to replace advice given to you by your health care provider. Make sure you discuss any questions you have with your health care provider.

## 2024-04-26 NOTE — Anesthesia Postprocedure Evaluation (Signed)
 Anesthesia Post Note  Patient: Kelseigh Diver Wages  Procedure(s) Performed: BRONCHOSCOPY, WITH EBUS (Right) BRONCHOSCOPY, WITH BIOPSY USING ELECTROMAGNETIC NAVIGATION (Right) BRONCHOSCOPY, WITH NEEDLE ASPIRATION BIOPSY BRONCHOSCOPY, WITH BIOPSY BRONCHOSCOPY, WITH BRUSH BIOPSY     Patient location during evaluation: PACU Anesthesia Type: General Level of consciousness: awake and alert, patient cooperative and oriented Pain management: pain level controlled Vital Signs Assessment: post-procedure vital signs reviewed and stable Respiratory status: spontaneous breathing, nonlabored ventilation and respiratory function stable Cardiovascular status: blood pressure returned to baseline and stable Postop Assessment: no apparent nausea or vomiting and adequate PO intake Anesthetic complications: no   No notable events documented.  Last Vitals:  Vitals:   04/26/24 1245 04/26/24 1300  BP: 131/62 (!) 159/77  Pulse: 88 90  Resp: 18 16  Temp:    SpO2: 99% 96%    Last Pain:  Vitals:   04/26/24 1300  TempSrc:   PainSc: 0-No pain                 Araiyah Cumpton,E. Bradden Tadros

## 2024-04-26 NOTE — Anesthesia Preprocedure Evaluation (Addendum)
 Anesthesia Evaluation  Patient identified by MRN, date of birth, ID band Patient awake    Reviewed: Allergy & Precautions, NPO status , Patient's Chart, lab work & pertinent test results  History of Anesthesia Complications Negative for: history of anesthetic complications  Airway Mallampati: II  TM Distance: >3 FB Neck ROM: Full    Dental  (+) Dental Advisory Given   Pulmonary Patient abstained from smoking., former smoker, PE   breath sounds clear to auscultation       Cardiovascular hypertension, Pt. on medications (-) angina + Peripheral Vascular Disease (subclavian artery stenosis R)   Rhythm:Regular Rate:Normal  02/2024 ECHO: EF 60 to 65%. 1. The LV has normal function, no regional wall motion abnormalities. Left ventricular diastolic parameters were normal.   2. RVF is normal. The right ventricular size is normal. There is normal pulmonary artery systolic pressure.   3. The mitral valve is normal in structure. No evidence of mitral valve regurgitation. No evidence of mitral stenosis.   4. The aortic valve is normal in structure. Aortic valve regurgitation is not visualized. No aortic stenosis is present.     Neuro/Psych   Anxiety     negative neurological ROS     GI/Hepatic negative GI ROS, Neg liver ROS,,,  Endo/Other  negative endocrine ROS    Renal/GU negative Renal ROS     Musculoskeletal   Abdominal   Peds  Hematology eliquis    Anesthesia Other Findings   Reproductive/Obstetrics                             Anesthesia Physical Anesthesia Plan  ASA: 3  Anesthesia Plan: General   Post-op Pain Management: Tylenol  PO (pre-op)*   Induction: Intravenous  PONV Risk Score and Plan: 3 and Ondansetron , Dexamethasone  and Treatment may vary due to age or medical condition  Airway Management Planned: Oral ETT  Additional Equipment: None  Intra-op Plan:   Post-operative Plan:  Extubation in OR  Informed Consent: I have reviewed the patients History and Physical, chart, labs and discussed the procedure including the risks, benefits and alternatives for the proposed anesthesia with the patient or authorized representative who has indicated his/her understanding and acceptance.     Dental advisory given  Plan Discussed with: CRNA and Surgeon  Anesthesia Plan Comments:        Anesthesia Quick Evaluation

## 2024-04-26 NOTE — Anesthesia Procedure Notes (Addendum)
 Procedure Name: Intubation Date/Time: 04/26/2024 11:29 AM  Performed by: Hebert Littler, CRNAPre-anesthesia Checklist: Patient identified, Emergency Drugs available, Suction available, Patient being monitored and Timeout performed Patient Re-evaluated:Patient Re-evaluated prior to induction Oxygen Delivery Method: Circle system utilized Preoxygenation: Pre-oxygenation with 100% oxygen Induction Type: IV induction Ventilation: Mask ventilation without difficulty Laryngoscope Size: Mac and 3 Grade View: Grade I Tube type: Oral Tube size: 8.5 mm Number of attempts: 1 Airway Equipment and Method: Patient positioned with wedge pillow and Stylet Placement Confirmation: ETT inserted through vocal cords under direct vision, positive ETCO2, CO2 detector and breath sounds checked- equal and bilateral Secured at: 23 cm Tube secured with: Tape

## 2024-04-26 NOTE — Op Note (Signed)
 Video Bronchoscopy with Endobronchial Ultrasound and Electromagnetic Navigation Procedure Note  Date of Operation: 04/26/2024  Pre-op Diagnosis: Right lower lobe pulmonary nodule, mediastinal adenopathy  Post-op Diagnosis: Same with bronchus intermedius and left mainstem endobronchial lesions  Surgeon: Racheal Buddle  Assistants: None  Anesthesia: General endotracheal anesthesia  Operation: Flexible video fiberoptic bronchoscopy with endobronchial ultrasound, robotic assisted navigation and biopsies.  Estimated Blood Loss: Minimal  Complications: None apparent  Indications and History: Kristina Howe is a 63 y.o. female with history of tobacco use who was admitted with pulmonary embolism in April 2025.  Her chest imaging also showed mediastinal lymphadenopathy, scattered pulmonary nodules.  Repeat imaging showed that a right lower lobe pulmonary nodule persisted.  Recommendation was made to achieve a tissue diagnosis using endobronchial ultrasound and robotic assisted navigational bronchoscopy. The risks, benefits, complications, treatment options and expected outcomes were discussed with the patient.  The possibilities of pneumothorax, pneumonia, reaction to medication, pulmonary aspiration, perforation of a viscus, bleeding, failure to diagnose a condition and creating a complication requiring transfusion or operation were discussed with the patient who freely signed the consent.    Description of Procedure: The patient was seen in the Preoperative Area, was examined and was deemed appropriate to proceed.  The patient was taken to [facility] Endoscopy room 3, identified as [name] and the procedure verified as Flexible Video Fiberoptic Bronchoscopy with robotic assisted navigation and endobronchial ultrasound.  A Time Out was held and the above information confirmed.   Robotic assisted navigation: Prior to the date of the procedure a high-resolution CT scan of the chest was performed.  Utilizing ION software program a virtual tracheobronchial tree was generated to allow the creation of distinct navigation pathways to the patient's parenchymal abnormalities. After being taken to the operating room general anesthesia was initiated and the patient  was orally intubated. The video fiberoptic bronchoscope was introduced via the endotracheal tube and a general inspection was performed which showed sharp main carina.  There was an exophytic vascular pale endobronchial lesion in the distal bronchus intermedius with some smaller irregular raised areas more proximally extending into the right mainstem bronchus.  The lobar airways were all normal.  There were similar raised mucosal abnormalities in the left mainstem bronchus.  Endobronchial forceps biopsies and endobronchial brushings were performed in both the bronchus intermedius and left mainstem bronchus to be sent for cytology. Aspiration of the bilateral mainstems was completed to remove any remaining secretions. Robotic catheter inserted into patient's endotracheal tube.   Target #1 right lower lobe pulmonary nodule: The distinct navigation pathways prepared prior to this procedure were then utilized to navigate to patient's lesion identified on CT scan. The robotic catheter was secured into place and the vision probe was withdrawn.  Lesion location was approximated using fluoroscopy.  Local registration and targeting was performed using [employer] Cios mobile C-arm three-dimensional imaging. Under fluoroscopic guidance transbronchial needle brushings, transbronchial needle biopsies, and transbronchial forceps biopsies were performed to be sent for cytology and pathology.  Needle-in-lesion was confirmed using Cios mobile C-arm.  A bronchioalveolar lavage was performed in the right lower lobe and sent for cytology.   Endobronchial ultrasound: The robotic scope was then withdrawn and the endobronchial ultrasound was used to identify and  characterize the peritracheal, hilar and bronchial lymph nodes. Inspection showed massive enlargement in the mediastinum particularly at station 7 and station 4R. Using real-time ultrasound guidance Wang needle biopsies were take from Station 4R node and were sent for cytology.  At the end of the procedure a general airway inspection was performed and there was no evidence of active bleeding. The bronchoscope was removed.  The patient tolerated the procedure well. There was no significant blood loss and there were no obvious complications. A post-procedural chest x-ray is pending.  Samples Target #1: 1. Transbronchial needle brushings from right lower lobe nodule 2. Transbronchial Wang needle biopsies from right lower lobe nodule 3. Transbronchial forceps biopsies from right lower lobe nodule 4. Bronchoalveolar lavage from right lower lobe  Endobronchial samples: 1.  Endobronchial brushings bronchus intermedius 2.  Endobronchial forceps biopsies bronchus intermedius 3.  Endobronchial brushings left mainstem bronchus 4.  Endobronchial forceps biopsies left mainstem bronchus  EBUS Samples: 1. Wang needle biopsies from 4R node   Racheal Buddle, MD, PhD 04/26/2024, 12:34 PM [facility]

## 2024-04-27 ENCOUNTER — Encounter (HOSPITAL_COMMUNITY): Payer: Self-pay | Admitting: Emergency Medicine

## 2024-04-27 LAB — CYTOLOGY - NON PAP

## 2024-04-28 DIAGNOSIS — C4492 Squamous cell carcinoma of skin, unspecified: Secondary | ICD-10-CM | POA: Diagnosis not present

## 2024-04-28 DIAGNOSIS — D485 Neoplasm of uncertain behavior of skin: Secondary | ICD-10-CM | POA: Diagnosis not present

## 2024-04-28 DIAGNOSIS — Z85828 Personal history of other malignant neoplasm of skin: Secondary | ICD-10-CM | POA: Diagnosis not present

## 2024-04-28 DIAGNOSIS — Z08 Encounter for follow-up examination after completed treatment for malignant neoplasm: Secondary | ICD-10-CM | POA: Diagnosis not present

## 2024-04-28 DIAGNOSIS — I731 Thromboangiitis obliterans [Buerger's disease]: Secondary | ICD-10-CM | POA: Diagnosis not present

## 2024-04-28 DIAGNOSIS — R229 Localized swelling, mass and lump, unspecified: Secondary | ICD-10-CM | POA: Diagnosis not present

## 2024-04-28 DIAGNOSIS — D045 Carcinoma in situ of skin of trunk: Secondary | ICD-10-CM | POA: Diagnosis not present

## 2024-04-28 LAB — CYTOLOGY - NON PAP

## 2024-04-30 ENCOUNTER — Telehealth: Payer: Self-pay | Admitting: Emergency Medicine

## 2024-04-30 NOTE — Telephone Encounter (Signed)
 Patient  dropped off disability claim form on 04/29/24. Will be placed in Dr.Byrum's box for completion.

## 2024-05-04 ENCOUNTER — Other Ambulatory Visit (HOSPITAL_COMMUNITY): Payer: Self-pay

## 2024-05-04 LAB — CYTOLOGY - NON PAP

## 2024-05-05 ENCOUNTER — Ambulatory Visit (INDEPENDENT_AMBULATORY_CARE_PROVIDER_SITE_OTHER): Admitting: Acute Care

## 2024-05-05 ENCOUNTER — Encounter: Payer: Self-pay | Admitting: Acute Care

## 2024-05-05 VITALS — BP 153/92 | HR 97 | Ht 67.0 in | Wt 110.0 lb

## 2024-05-05 DIAGNOSIS — C3480 Malignant neoplasm of overlapping sites of unspecified bronchus and lung: Secondary | ICD-10-CM | POA: Diagnosis not present

## 2024-05-05 DIAGNOSIS — F419 Anxiety disorder, unspecified: Secondary | ICD-10-CM

## 2024-05-05 DIAGNOSIS — C3490 Malignant neoplasm of unspecified part of unspecified bronchus or lung: Secondary | ICD-10-CM

## 2024-05-05 DIAGNOSIS — Z87891 Personal history of nicotine dependence: Secondary | ICD-10-CM | POA: Diagnosis not present

## 2024-05-05 DIAGNOSIS — Z9889 Other specified postprocedural states: Secondary | ICD-10-CM

## 2024-05-05 NOTE — Progress Notes (Signed)
 History of Present Illness Kristina Howe is a 63 y.o. female with recently former smoker ( Quit 03/18/2024) with a 20 pack year smoking history, referred to Dr. Baldwin Levee for lung nodules.    Synopsis Kristina Howe is a 63 year old female who presents for follow-up of recent hospitalization for right hand digital cyanosis and ischemia.   She was hospitalized from late March to early April due to right hand digital cyanosis and ischemia. During this hospitalization, she underwent evaluation by vascular surgery and required balloon angioplasty and stenting. She was also found to have acute pulmonary embolism with multiple branching intraluminal filling defects on a CT angiogram of the chest performed on March 09, 2024. She underwent stent placement from the innominate artery to the right common carotid and right subclavian arteries.   Her anticoagulation therapy was initially with heparin , which was later converted to Eliquis  at discharge. She is currently on Eliquis , with a plan to continue for at least three months following her vascular surgery follow-up on Apr 09, 2024.   The CT scan of the chest also revealed mediastinal lymphadenopathy and bilateral pulmonary nodules, including a 9 mm right lower lobe spiculated nodule, a 5 mm right upper lobe nodule, and a 15 mm elongated parabronchial left upper lobe nodule. There was pathologic right paratracheal and subcarinal adenopathy up to 4 cm, and circumferential wall thickening of the midesophagus.   She quit smoking on March 18, 2024, after smoking about half a pack a day for 40 years. She denies shortness of breath and is not on any breathing medications. She expresses significant anxiety and emotional distress related to her medical condition.   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 She has been in [employer] for over 30 years and has  lived locally since 1984. She denies any history of tuberculosis or known exposure to TB.   She underwent bronchoscopy with biopsies 04/26/2024. She is here today to review cytology results and ensure she has done well after the procedure.   05/05/2024 Pt. Presents for follow up after bronchoscopy with biopsies 04/26/2024.She is very anxious today in the office . Her hands are shaking and she is hyperventilating. She is out of her Xanax , and she is reaching out to her PCP for a new prescription to get her through her current lung nodule work up.She endorses 14 pound weight loss in the last 2 months.   She states she did well after the bronchoscopy with biopsy. She had some scant bleeding after the procedure., which has resolved.  She denies any fever, infectious symptoms , worsening dyspnea than her baseline, or adverse reaction to anesthesia.  We have discussed her biopsy results. She had biopsies + for non small cell lung cancer in the  right bronchus, left mainstem, and 4 R lymph node. Dr. Baldwin Levee had referred her to medical oncology. She has an appointment with Dr. Marguerita Shih 05/11/2024 to discuss treatment options. I have ordered PET scan and MR Brain to complete staging.   She verbalized understanding of her diagnosis. I have reassured her the Cancer Center will take excellent care of her. She is here in the office today alone. I have encouraged her to take her boyfriend with her to the consult appointment with Dr.Mohamed.     Test Results: Cytology 04/26/2024  A. LUNG, RIGHT LOWER LOBE, FINE NEEDLE ASPIRATION:  - No malignant  cells identified   B. LUNG, RIGHT LOWER LOBE, BRUSHING:  - No malignant cells identified   C. LUNG, RIGHT LOWER LOBE, LAVAGE:    FINAL MICROSCOPIC DIAGNOSIS:  - Non-small cell carcinoma  - See comment   D. LUNG, RIGHT BRONCHUS INTERMEDIUS, BIOPSY:  - Non-small cell carcinoma  -See comment   E. LUNG, RIGHT BRONCHUS INTERMEDIUS, BRUSHING:  - Non-small cell  carcinoma   COMMENT:  D. Immunohistochemistry will be performed on the cellblock and reported  as an addendum.   F. LUNG, LEFT MAIN STEM, BIOPSY:  - Non-small cell carcinoma  - See comment   G. LUNG, LEFT MAIN STEM, BRUSHING:  - Non-small cell carcinoma    H. LYMPH NODE, 4R, FINE NEEDLE ASPIRATION:    FINAL MICROSCOPIC DIAGNOSIS:  - Non-small cell carcinoma  - See comment    CT angio chest: Acute pulmonary embolism with multiple branching intraluminal filling defects (03/09/2024)  CT chest: Mediastinal lymphadenopathy, bilateral pulmonary nodules: 9 mm right lower lobe spiculated nodule, 5 mm right upper lobe nodule, elongated parabranchial left upper lobe nodule 15 mm, pathologic right paratracheal and subcarinal adenopathy up to 4 cm, circumferential wall thickening of the midesophagus (03/09/2024)      Latest Ref Rng & Units 03/12/2024    2:57 AM 03/11/2024    1:29 PM 03/11/2024    6:22 AM  CBC  WBC 4.0 - 10.5 K/uL 6.4   4.3   Hemoglobin 12.0 - 15.0 g/dL 8.1  78.2  95.6   Hematocrit 36.0 - 46.0 % 24.8  31.0  34.1   Platelets 150 - 400 K/uL 187   211        Latest Ref Rng & Units 04/26/2024    9:09 AM 03/12/2024    2:57 AM 03/11/2024    1:29 PM  BMP  Glucose 70 - 99 mg/dL 213  086    BUN 8 - 23 mg/dL 16  18    Creatinine 5.78 - 1.00 mg/dL 4.69  6.29    Sodium 528 - 145 mmol/L 140  137  138   Potassium 3.5 - 5.1 mmol/L 4.1  4.4  4.1   Chloride 98 - 111 mmol/L 105  105    CO2 22 - 32 mmol/L 22  23    Calcium  8.9 - 10.3 mg/dL 9.9  9.0      BNP No results found for: "BNP"  ProBNP No results found for: "PROBNP"  PFT No results found for: "FEV1PRE", "FEV1POST", "FVCPRE", "FVCPOST", "TLC", "DLCOUNC", "PREFEV1FVCRT", "PSTFEV1FVCRT"  CT Super D Chest Wo Contrast Result Date: 05/04/2024 CLINICAL DATA:  Mediastinal adenopathy.  Pulmonary nodules EXAM: CT CHEST WITHOUT CONTRAST TECHNIQUE: Multidetector CT imaging of the chest was performed using thin slice collimation for  electromagnetic bronchoscopy planning purposes, without intravenous contrast. RADIATION DOSE REDUCTION: This exam was performed according to the departmental dose-optimization program which includes automated exposure control, adjustment of the mA and/or kV according to patient size and/or use of iterative reconstruction technique. COMPARISON:  CT chest 03/24/2024 FINDINGS: Cardiovascular: No acute vascular findings on noncontrast CT. Stent in the innominate artery. Mediastinum/Nodes: Bulky subcarinal and RIGHT paratracheal adenopathy again demonstrated. No similar size to comparison CT Lungs/Pleura: Centrilobular emphysema the upper lobes. Peripheral nodule in the RIGHT upper lobe measuring 6 mm on image 58/4 unchanged. RIGHT lower lobe nodule measuring 13 mm peribronchial thickening on image 89/4 also unchanged. Upper Abdomen: Limited view of the liver, kidneys, pancreas are unremarkable. Normal adrenal glands. Musculoskeletal: No aggressive osseous lesion. IMPRESSION: 1. Stable  mediastinal adenopathy. 2. Stable RIGHT upper lobe and RIGHT lower lobe pulmonary nodules. 3.  Emphysema (ICD10-J43.9). Electronically Signed   By: Deboraha Fallow M.D.   On: 05/04/2024 11:46   DG Chest Port 1 View Result Date: 04/26/2024 CLINICAL DATA:  Status post bronchoscopy and biopsy. EXAM: PORTABLE CHEST 1 VIEW COMPARISON:  Chest radiograph dated 12/14/2021. FINDINGS: No pneumothorax. Background of emphysema and interstitial coarsening. No consolidative changes or pleural effusion. The cardiac silhouette is within limits the. No acute osseous pathology. IMPRESSION: 1. No pneumothorax. 2. Emphysema. Electronically Signed   By: Angus Bark M.D.   On: 04/26/2024 15:14   DG C-ARM BRONCHOSCOPY Result Date: 04/26/2024 C-ARM BRONCHOSCOPY: Fluoroscopy was utilized by the requesting physician.  No radiographic interpretation.   VAS US  UPPER EXTREMITY ARTERIAL DUPLEX Result Date: 04/19/2024  UPPER EXTREMITY DUPLEX STUDY Patient  Name:  EMYLIA LATELLA  Date of Exam:   04/19/2024 Medical Rec #: [id number]       Accession #:    [id number] Date of Birth: [birth year]       Patient Gender: F Patient Age:   44 years Exam Location:  [address] Procedure:      VAS US  UPPER EXTREMITY ARTERIAL DUPLEX Referring Phys: Delaney Fearing --------------------------------------------------------------------------------  Indications: Right Upper Digit discoloration. History:     Patient has a history of upper extremity pain and catheterization              via right radial artery.  Other Factors: PROCEDURE:                1. Ultrasound-guided access of right radial artery                2. Ultrasound-guided access of right common femoral artery                3. Aortogram and right upper extremity angiogram                4. Balloon angioplasty and stenting of the right subclavian                artery                5. Balloon angioplasty and stenting of the right carotid artery                6. Exposure and direct repair of the right common carotid artery                7. Pro-glide closure of right common femoral artery                8. TR band application to the right radial artery Performing Technologist: Jenifer Miu RVT  Examination Guidelines: A complete evaluation includes B-mode imaging, spectral Doppler, color Doppler, and power Doppler as needed of all accessible portions of each vessel. Bilateral testing is considered an integral part of a complete examination. Limited examinations for reoccurring indications may be performed as noted.  Right Doppler Findings: +---------------+----------+----------+----------------------------------------+ Site           PSV (cm/s)Waveform  Comments                                 +---------------+----------+----------+----------------------------------------+ Subclavian Prox124       triphasic                                           +---------------+----------+----------+----------------------------------------+  Subclavian Mid 60        triphasic                                          +---------------+----------+----------+----------------------------------------+ Subclavian Dist50        retrograde                                         +---------------+----------+----------+----------------------------------------+ Axillary       73        triphasic                                          +---------------+----------+----------+----------------------------------------+ Brachial Prox  68        triphasic                                          +---------------+----------+----------+----------------------------------------+ Brachial Mid   97        triphasic                                          +---------------+----------+----------+----------------------------------------+ Brachial Dist  80        triphasic                                          +---------------+----------+----------+----------------------------------------+ Radial Prox    18        biphasic  High Radial Bifucation (Distal upper                                        arm)                                     +---------------+----------+----------+----------------------------------------+ Radial Mid     9         biphasic                                           +---------------+----------+----------+----------------------------------------+ Radial Dist    6         retrogradeRetrograde at wrist; returns antegrade                                      with ulnar artery compression            +---------------+----------+----------+----------------------------------------+ Ulnar Prox     44        biphasic                                           +---------------+----------+----------+----------------------------------------+  Ulnar Mid      52        biphasic                                            +---------------+----------+----------+----------------------------------------+ Ulnar Dist     63        biphasic                                           +---------------+----------+----------+----------------------------------------+ Incidental Findings: Right Basilic Vein Superficial Thrombosis - Upper arm to Forearm  Summary:  Right: - Patent upper extremity arteries where visualized.        - High radial artery bifucation with small caliber vessel        size throughout (0.10 cm) with no significant obstruction        identified; however, retrograde flow is observed at the wrist        and returns antegrade with ulnar artery compression        suggestive of a steal component.        - Low velocities throughout the right radial artery.         Incidental Findings: Superficial vein thrombosis of the right        basilic vein upper arm and forearm segments. *See table(s) above for measurements and observations. Electronically signed by Genny Kid MD on 04/19/2024 at 11:47:52 AM.    Final      Past medical hx Past Medical History:  Diagnosis Date   Anemia 04/02/2024   hgb = 9   Anxiety    tx w/ xanax  and celexa    Elevated cholesterol    Hypertension    Subclavian artery stenosis, right (HCC) 04/19/2024   right forearm pain;  thrombophlebitis of her superficial basilic forearm veins- instructed her to take ASA 81 mg & warm compresses     Social History   Tobacco Use   Smoking status: Former    Types: Cigarettes    Start date: 03/18/2024    Quit date: 03/18/1994    Years since quitting: 30.1   Smokeless tobacco: Never   Tobacco comments:    Quit smoking 03/18/2024   Vaping Use   Vaping status: Never Used  Substance Use Topics   Alcohol use: Not Currently   Drug use: No    Ms.Mcmullen reports that she quit smoking about 30 years ago. Her smoking use included cigarettes. She started smoking about 6 weeks ago. She has never used smokeless tobacco. She reports that she does not  currently use alcohol. She reports that she does not use drugs.  Tobacco Cessation: Counseling given: Not Answered Tobacco comments: Quit smoking 03/18/2024  Recent Former smoker, smoked 1/2 PPD x 40 years 20 pack year smoking history  Past surgical hx, Family hx, Social hx all reviewed.  Current Outpatient Medications on File Prior to Visit  Medication Sig   ALPRAZolam  (XANAX ) 0.5 MG tablet Take 0.5 mg by mouth at bedtime.   apixaban  (ELIQUIS ) 5 MG TABS tablet Take 1 tablet (5 mg total) by mouth 2 (two) times daily.   APIXABAN  (ELIQUIS ) VTE STARTER PACK (10MG  AND 5MG ) Take as directed on package: start with two-5mg  tablets twice daily for 7 days. On day 8, switch to one-5mg  tablet twice daily.  aspirin  81 MG chewable tablet Chew 81 mg by mouth daily. For right forearm pain; thrombophlebitis of her superficial basilic forearm veins- instructed her to take ASA 81 mg & warm compresses   atorvastatin  (LIPITOR) 40 MG tablet Take 1 tablet (40 mg total) by mouth daily.   FLUoxetine (PROZAC) 20 MG capsule Take 20 mg by mouth daily.   magnesium 30 MG tablet Take 30 mg by mouth 2 (two) times daily.   Multiple Vitamins-Minerals (MULTIVITAMIN WITH MINERALS) tablet Take 1 tablet by mouth daily.   NIFEdipine  (ADALAT  CC) 30 MG 24 hr tablet Take 1 tablet (30 mg total) by mouth daily.   triamterene-hydrochlorothiazide (MAXZIDE-25) 37.5-25 MG per tablet Take 1 tablet by mouth daily.     valsartan (DIOVAN) 320 MG tablet Take 320 mg by mouth daily.   No current facility-administered medications on file prior to visit.     No Known Allergies  Review Of Systems:  Constitutional:   +  weight loss, No night sweats,  Fevers, chills, fatigue, or  lassitude.  HEENT:   No headaches,  Difficulty swallowing,  Tooth/dental problems, or  Sore throat,                No sneezing, itching, ear ache, nasal congestion, post nasal drip,   CV:  No chest pain,  Orthopnea, PND, swelling in lower extremities, anasarca,  dizziness, palpitations, syncope.   GI  No heartburn, indigestion, abdominal pain, nausea, vomiting, diarrhea, change in bowel habits, loss of appetite, bloody stools.   Resp: No shortness of breath with exertion or at rest.  No excess mucus, no productive cough,  No non-productive cough,  No coughing up of blood.  No change in color of mucus.  No wheezing.  No chest wall deformity  Skin: no rash or lesions.  GU: no dysuria, change in color of urine, no urgency or frequency.  No flank pain, no hematuria   MS:  No joint pain or swelling.  No decreased range of motion.  No back pain.  Psych:  No change in mood or affect. No depression ++ anxiety.  No memory loss.   Vital Signs BP (!) 153/92 (BP Location: Left Arm, Patient Position: Sitting, Cuff Size: Small)   Pulse 97   Ht 5\' 7"  (1.702 m)   Wt 110 lb (49.9 kg)   SpO2 97%   BMI 17.23 kg/m    Physical Exam:  General- No distress,  A&Ox3, pleasant, very anxious ENT: No sinus tenderness, TM clear, pale nasal mucosa, no oral exudate,no post nasal drip, no LAN Cardiac: S1, S2, regular rate and rhythm, no murmur Chest: No wheeze/ rales/ dullness; no accessory muscle use, no nasal flaring, no sternal retractions, few rhonchi, diminished per bases Abd.: Soft Non-tender, ND, BS +Body mass index is 17.23 kg/m.  Ext: No clubbing cyanosis, edema Neuro:  normal strength, MAE x 4, A&O x 3, very anxious Skin: No rashes, warm and dry, No obvious skin lesions  Psych: normal mood and behavior, very anxious   Assessment/Plan New diagnosis non small cell lung cancer Lymph Node involvement  Recently former smoker with a 20 pack year smoking history. Post bronchoscopy with biopsies Plan I am glad you did well after the biopsy. You biopsy results were positive for non small cell lung cancer in several sites. You have been referred to Dr Marguerita Shih at the [facility]. He will discuss treatment options with you when you see him on  05/11/2024. I am glad you have been  able to get your Xanax  refilled. Please consider taking a support person with you to the visit with Dr. Marguerita Shih.  Dr. Marguerita Shih and the cancer center staff will take great care of you. I have ordered a PET scan and an MRI brain to complete staging. You will get calls to get these scheduled. Delynn Fill with your treatment. Call us  if you need us .  Please contact office for sooner follow up if symptoms do not improve or worsen or seek emergency care    I spent 35 minutes dedicated to the care of this patient on the date of this encounter to include pre-visit review of records, face-to-face time with the patient discussing conditions above, post visit ordering of testing, clinical documentation with the electronic health record, making appropriate referrals as documented, and communicating necessary information to the patient's healthcare team.    Raejean Bullock, NP 05/05/2024  8:39 AM

## 2024-05-05 NOTE — Telephone Encounter (Signed)
Form has been recieved

## 2024-05-05 NOTE — Patient Instructions (Addendum)
 It is good to see you today. I am glad you did well after the biopsy. You biopsy results were positive for non small cell lung cancer in several sites. You have been referred to Dr Marguerita Shih at the [facility]. He will discuss treatment options with you when you see him on 05/11/2024. I am glad you have been able to get your Xanax  refilled. Please consider taking a support person with you to the visit with Dr. Marguerita Shih.  Dr. Marguerita Shih and the cancer center staff will take great care of you. I have ordered a PET scan and an MRI brain to complete staging. You will get calls to get these scheduled. Delynn Fill with your treatment. Call us  if you need us .  Please contact office for sooner follow up if symptoms do not improve or worsen or seek emergency care

## 2024-05-06 ENCOUNTER — Encounter (HOSPITAL_COMMUNITY): Payer: Self-pay

## 2024-05-06 ENCOUNTER — Other Ambulatory Visit: Payer: Self-pay

## 2024-05-07 NOTE — Progress Notes (Signed)
 The proposed treatment discussed in conference is for discussion purpose only and is not a binding recommendation.  The patients have not been physically examined, or presented with their treatment options.  Therefore, final treatment plans cannot be decided.

## 2024-05-11 ENCOUNTER — Inpatient Hospital Stay

## 2024-05-11 ENCOUNTER — Inpatient Hospital Stay: Attending: Internal Medicine | Admitting: Internal Medicine

## 2024-05-11 ENCOUNTER — Other Ambulatory Visit: Payer: Self-pay

## 2024-05-11 ENCOUNTER — Telehealth: Payer: Self-pay | Admitting: Acute Care

## 2024-05-11 ENCOUNTER — Other Ambulatory Visit: Payer: Self-pay | Admitting: Medical Oncology

## 2024-05-11 VITALS — BP 138/90 | HR 102 | Temp 98.1°F | Resp 16 | Wt 104.5 lb

## 2024-05-11 DIAGNOSIS — R918 Other nonspecific abnormal finding of lung field: Secondary | ICD-10-CM

## 2024-05-11 DIAGNOSIS — R590 Localized enlarged lymph nodes: Secondary | ICD-10-CM

## 2024-05-11 DIAGNOSIS — C3431 Malignant neoplasm of lower lobe, right bronchus or lung: Secondary | ICD-10-CM

## 2024-05-11 DIAGNOSIS — Z87891 Personal history of nicotine dependence: Secondary | ICD-10-CM | POA: Diagnosis not present

## 2024-05-11 DIAGNOSIS — C3490 Malignant neoplasm of unspecified part of unspecified bronchus or lung: Secondary | ICD-10-CM | POA: Insufficient documentation

## 2024-05-11 LAB — CBC WITH DIFFERENTIAL (CANCER CENTER ONLY)
Abs Immature Granulocytes: 0.02 10*3/uL (ref 0.00–0.07)
Basophils Absolute: 0.1 10*3/uL (ref 0.0–0.1)
Basophils Relative: 1 %
Eosinophils Absolute: 0.1 10*3/uL (ref 0.0–0.5)
Eosinophils Relative: 1 %
HCT: 33.5 % — ABNORMAL LOW (ref 36.0–46.0)
Hemoglobin: 10.8 g/dL — ABNORMAL LOW (ref 12.0–15.0)
Immature Granulocytes: 0 %
Lymphocytes Relative: 11 %
Lymphs Abs: 0.9 10*3/uL (ref 0.7–4.0)
MCH: 26.5 pg (ref 26.0–34.0)
MCHC: 32.2 g/dL (ref 30.0–36.0)
MCV: 82.1 fL (ref 80.0–100.0)
Monocytes Absolute: 0.6 10*3/uL (ref 0.1–1.0)
Monocytes Relative: 8 %
Neutro Abs: 6.1 10*3/uL (ref 1.7–7.7)
Neutrophils Relative %: 79 %
Platelet Count: 378 10*3/uL (ref 150–400)
RBC: 4.08 MIL/uL (ref 3.87–5.11)
RDW: 14.6 % (ref 11.5–15.5)
WBC Count: 7.7 10*3/uL (ref 4.0–10.5)
nRBC: 0 % (ref 0.0–0.2)

## 2024-05-11 LAB — CMP (CANCER CENTER ONLY)
ALT: 8 U/L (ref 0–44)
AST: 17 U/L (ref 15–41)
Albumin: 4.5 g/dL (ref 3.5–5.0)
Alkaline Phosphatase: 96 U/L (ref 38–126)
Anion gap: 13 (ref 5–15)
BUN: 17 mg/dL (ref 8–23)
CO2: 25 mmol/L (ref 22–32)
Calcium: 10.9 mg/dL — ABNORMAL HIGH (ref 8.9–10.3)
Chloride: 101 mmol/L (ref 98–111)
Creatinine: 0.82 mg/dL (ref 0.44–1.00)
GFR, Estimated: >60 mL/min (ref >60)
Glucose, Bld: 104 mg/dL — ABNORMAL HIGH (ref 70–99)
Potassium: 4.1 mmol/L (ref 3.5–5.1)
Sodium: 139 mmol/L (ref 135–145)
Total Bilirubin: 0.6 mg/dL (ref 0.0–1.2)
Total Protein: 8.5 g/dL — ABNORMAL HIGH (ref 6.5–8.1)

## 2024-05-11 NOTE — Telephone Encounter (Signed)
 MRI is already scheduled for June 9th at [facility]. No callback number to verify this crm information.   Copied from CRM [id number]. Topic: Clinical - Medication Prior Auth >> May 10, 2024 10:02 AM Ambrose Junk wrote: Reason for CRM:  [facility] Provider calling for an order for patient's MRI of brain with or without contrast. FAX: [phone]

## 2024-05-11 NOTE — Progress Notes (Signed)
 [facility] [phone]   [phone]  CONSULT NOTE  REFERRING PHYSICIAN: Racheal Buddle  REASON FOR CONSULTATION:  63 years old white female recently diagnosed with lung cancer  HPI Kristina Howe is a 63 y.o. female.   Discussed the use of AI scribe software for clinical note transcription with the patient, who gave verbal consent to proceed.  History of Present Illness   Kristina Howe is a 63 year old female with lung cancer who presents for initial oncology consultation. She is accompanied by her boyfriend, Kristina Howe, and her friend, Kristina Howe. She was referred by Dr. Baldwin Levee, a pulmonologist, following a bronchoscopy with biopsy.  Initially, she was seen in the emergency room for suspected infection in her right fingers. A CT scan revealed lack of blood flow, leading to the placement of two stents in the right subclavian arteries. During this workup, a CT scan of the chest identified multiple nodules and lymphadenopathy, including nodules in the right lower lobe, and left upper lobe, as well as lymph node enlargement in the mediastinum and subcarinal area.  She is scheduled for a PET scan and MRI on June 9th to further evaluate the extent of the disease.  She experiences significant fatigue, a desire to sleep frequently, and chest pain localized to the right side. She also notes high anxiety levels and intermittent headaches, which have improved since waking. She has experienced a decrease in appetite, primarily consuming chicken noodle soup, and has lost approximately six pounds, now weighing 104 pounds. No nausea, vomiting, diarrhea, or hemoptysis, except post-bronchoscopy. She occasionally experiences mild wheezing but no significant shortness of breath.  Her past medical history includes hypertension, hyperlipidemia, and a previously stented subclavian artery. She quit smoking on April 10th after smoking for over 45 years, starting at age 68, with a variable  consumption of up to a pack a day. She denies alcohol and drug use.  Family history is significant for her father having had brain cancer and her mother having emphysema. She has one son and works as a Location manager at [employer], a job she has held for 30 years.       Past Medical History:  Diagnosis Date   Anemia 04/02/2024   hgb = 9   Anxiety    tx w/ xanax  and celexa    Elevated cholesterol    Hypertension    Subclavian artery stenosis, right (HCC) 04/19/2024   right forearm pain;  thrombophlebitis of her superficial basilic forearm veins- instructed her to take ASA 81 mg & warm compresses    Past Surgical History:  Procedure Laterality Date   ABDOMINAL HYSTERECTOMY     AORTOGRAM N/A 03/11/2024   Procedure: AORTIC ARCH ANGIOGRAM;  Surgeon: Philipp Brawn, MD;  Location[facility] OR;  Service: Vascular;  Laterality: N/A;   BRONCHIAL BIOPSY  04/26/2024   Procedure: BRONCHOSCOPY, WITH BIOPSY;  Surgeon: Denson Flake, MD;  Location: [facility] ENDOSCOPY;  Service: Pulmonary;;   BRONCHIAL BRUSHINGS  04/26/2024   Procedure: BRONCHOSCOPY, WITH BRUSH BIOPSY;  Surgeon: Denson Flake, MD;  Location: [facility] ENDOSCOPY;  Service: Pulmonary;;   BRONCHIAL NEEDLE ASPIRATION BIOPSY  04/26/2024   Procedure: BRONCHOSCOPY, WITH NEEDLE ASPIRATION BIOPSY;  Surgeon: Denson Flake, MD;  Location: [facility] ENDOSCOPY;  Service: Pulmonary;;   BRONCHIAL WASHINGS  04/26/2024   Procedure: IRRIGATION, BRONCHUS;  Surgeon: Denson Flake, MD;  Location: [facility] ENDOSCOPY;  Service: Pulmonary;;   BRONCHOSCOPY, WITH BIOPSY USING ELECTROMAGNETIC NAVIGATION Right 04/26/2024  Procedure: BRONCHOSCOPY, WITH BIOPSY USING ELECTROMAGNETIC NAVIGATION;  Surgeon: Denson Flake, MD;  Location: [facility] ENDOSCOPY;  Service: Pulmonary;  Laterality: Right;   COLONOSCOPY  2024   INSERTION OF RETROGRADE CAROTID STENT Right 03/11/2024   Procedure: INSERTION RIGHT SUBCLAVIAN ARTERY AND RIGHT COMMON CAROTID ARTERY STENTS;  Surgeon: Philipp Brawn,  MD;  Location: [facility] OR;  Service: Vascular;  Laterality: Right;   THORACIC EXPOSURE Right 03/11/2024   Procedure: DIRECT EXPOSURE OF RIGHT COMMON CAROTID ARTERY;  Surgeon: Philipp Brawn, MD;  Location[facility] OR;  Service: Vascular;  Laterality: Right;   ULTRASOUND GUIDANCE FOR VASCULAR ACCESS Right 03/11/2024   Procedure: ULTRASOUND GUIDANCE, FOR VASCULAR ACCESS, RIGHT RADIAL ARTERY AND RIGHT FEMORAL ARTERY;  Surgeon: Philipp Brawn, MD;  Location: [facility] OR;  Service: Vascular;  Laterality: Right;   UPPER EXTREMITY ANGIOGRAM Right 03/11/2024   Procedure: [employer], RIGHT UPPER EXTREMITY;  Surgeon: Philipp Brawn, MD;  Location: [facility] OR;  Service: Vascular;  Laterality: Right;   UPPER GI ENDOSCOPY  2022   VIDEO BRONCHOSCOPY WITH ENDOBRONCHIAL ULTRASOUND Right 04/26/2024   Procedure: BRONCHOSCOPY, WITH EBUS;  Surgeon: Denson Flake, MD;  Location: [facility] ENDOSCOPY;  Service: Pulmonary;  Laterality: Right;  Possible robotic navigation as well depending on CT results    No family history on file.  Social History Social History   Tobacco Use   Smoking status: Former    Types: Cigarettes    Start date: 03/18/2024    Quit date: 03/18/1994    Years since quitting: 30.1   Smokeless tobacco: Never   Tobacco comments:    Quit smoking 03/18/2024   Vaping Use   Vaping status: Never Used  Substance Use Topics   Alcohol use: Not Currently   Drug use: No    No Known Allergies  Current Outpatient Medications  Medication Sig Dispense Refill   ALPRAZolam  (XANAX ) 0.5 MG tablet Take 0.5 mg by mouth at bedtime.     apixaban  (ELIQUIS ) 5 MG TABS tablet Take 1 tablet (5 mg total) by mouth 2 (two) times daily. 60 tablet 1   APIXABAN  (ELIQUIS ) VTE STARTER PACK (10MG  AND 5MG ) Take as directed on package: start with two-5mg  tablets twice daily for 7 days. On day 8, switch to one-5mg  tablet twice daily. 74 each 0   aspirin  81 MG chewable tablet Chew 81 mg by mouth daily. For right forearm pain; thrombophlebitis of  her superficial basilic forearm veins- instructed her to take ASA 81 mg & warm compresses     atorvastatin  (LIPITOR) 40 MG tablet Take 1 tablet (40 mg total) by mouth daily. 30 tablet 0   FLUoxetine (PROZAC) 20 MG capsule Take 20 mg by mouth daily.     magnesium 30 MG tablet Take 30 mg by mouth 2 (two) times daily.     Multiple Vitamins-Minerals (MULTIVITAMIN WITH MINERALS) tablet Take 1 tablet by mouth daily.     NIFEdipine  (ADALAT  CC) 30 MG 24 hr tablet Take 1 tablet (30 mg total) by mouth daily. 30 tablet 0   triamterene-hydrochlorothiazide (MAXZIDE-25) 37.5-25 MG per tablet Take 1 tablet by mouth daily.       valsartan (DIOVAN) 320 MG tablet Take 320 mg by mouth daily.     No current facility-administered medications for this visit.    Review of Systems  Constitutional: positive for anorexia, fatigue, and weight loss Eyes: negative Ears, nose, mouth, throat, and face: negative Respiratory: positive for cough Cardiovascular: negative Gastrointestinal: negative Genitourinary:negative Integument/breast: negative Hematologic/lymphatic: negative Musculoskeletal:negative Neurological:  positive for headaches Behavioral/Psych: negative Endocrine: negative Allergic/Immunologic: negative  Physical Exam  JXB:JYNWG, healthy, no distress, well nourished, well developed, and anxious SKIN: skin color, texture, turgor are normal, no rashes or significant lesions HEAD: Normocephalic, No masses, lesions, tenderness or abnormalities EYES: normal, PERRLA, Conjunctiva are pink and non-injected EARS: External ears normal, Canals clear OROPHARYNX:no exudate, no erythema, and lips, buccal mucosa, and tongue normal  NECK: supple, no adenopathy, no JVD LYMPH:  no palpable lymphadenopathy, no hepatosplenomegaly BREAST:not examined LUNGS: clear to auscultation , and palpation HEART: regular rate & rhythm, no murmurs, and no gallops ABDOMEN:abdomen soft, non-tender, normal bowel sounds, and no  masses or organomegaly BACK: Back symmetric, no curvature., No CVA tenderness EXTREMITIES:no joint deformities, effusion, or inflammation, no edema  NEURO: alert & oriented x 3 with fluent speech, no focal motor/sensory deficits  PERFORMANCE STATUS: ECOG 1  LABORATORY DATA: Lab Results  Component Value Date   WBC 7.7 05/11/2024   HGB 10.8 (L) 05/11/2024   HCT 33.5 (L) 05/11/2024   MCV 82.1 05/11/2024   PLT 378 05/11/2024      Chemistry      Component Value Date/Time   NA 139 05/11/2024 1342   K 4.1 05/11/2024 1342   CL 101 05/11/2024 1342   CO2 25 05/11/2024 1342   BUN 17 05/11/2024 1342   CREATININE 0.82 05/11/2024 1342      Component Value Date/Time   CALCIUM  10.9 (H) 05/11/2024 1342   ALKPHOS 96 05/11/2024 1342   AST 17 05/11/2024 1342   ALT 8 05/11/2024 1342   BILITOT 0.6 05/11/2024 1342       RADIOGRAPHIC STUDIES: CT Super D Chest Wo Contrast Result Date: 05/04/2024 CLINICAL DATA:  Mediastinal adenopathy.  Pulmonary nodules EXAM: CT CHEST WITHOUT CONTRAST TECHNIQUE: Multidetector CT imaging of the chest was performed using thin slice collimation for electromagnetic bronchoscopy planning purposes, without intravenous contrast. RADIATION DOSE REDUCTION: This exam was performed according to the departmental dose-optimization program which includes automated exposure control, adjustment of the mA and/or kV according to patient size and/or use of iterative reconstruction technique. COMPARISON:  CT chest 03/24/2024 FINDINGS: Cardiovascular: No acute vascular findings on noncontrast CT. Stent in the innominate artery. Mediastinum/Nodes: Bulky subcarinal and RIGHT paratracheal adenopathy again demonstrated. No similar size to comparison CT Lungs/Pleura: Centrilobular emphysema the upper lobes. Peripheral nodule in the RIGHT upper lobe measuring 6 mm on image 58/4 unchanged. RIGHT lower lobe nodule measuring 13 mm peribronchial thickening on image 89/4 also unchanged. Upper  Abdomen: Limited view of the liver, kidneys, pancreas are unremarkable. Normal adrenal glands. Musculoskeletal: No aggressive osseous lesion. IMPRESSION: 1. Stable mediastinal adenopathy. 2. Stable RIGHT upper lobe and RIGHT lower lobe pulmonary nodules. 3.  Emphysema (ICD10-J43.9). Electronically Signed   By: Deboraha Fallow M.D.   On: 05/04/2024 11:46   DG Chest Port 1 View Result Date: 04/26/2024 CLINICAL DATA:  Status post bronchoscopy and biopsy. EXAM: PORTABLE CHEST 1 VIEW COMPARISON:  Chest radiograph dated 12/14/2021. FINDINGS: No pneumothorax. Background of emphysema and interstitial coarsening. No consolidative changes or pleural effusion. The cardiac silhouette is within limits the. No acute osseous pathology. IMPRESSION: 1. No pneumothorax. 2. Emphysema. Electronically Signed   By: Angus Bark M.D.   On: 04/26/2024 15:14   DG C-ARM BRONCHOSCOPY Result Date: 04/26/2024 C-ARM BRONCHOSCOPY: Fluoroscopy was utilized by the requesting physician.  No radiographic interpretation.   VAS US  UPPER EXTREMITY ARTERIAL DUPLEX Result Date: 04/19/2024  UPPER EXTREMITY DUPLEX STUDY Patient Name:  SHAILY LIBRIZZI  Date  of Exam:   04/19/2024 Medical Rec #: [id number]       Accession #:    [id number] Date of Birth: 05-05-1961       Patient Gender: F Patient Age:   67 years Exam Location:  [address] Procedure:      VAS US  UPPER EXTREMITY ARTERIAL DUPLEX Referring Phys: Delaney Fearing --------------------------------------------------------------------------------  Indications: Right Upper Digit discoloration. History:     Patient has a history of upper extremity pain and catheterization              via right radial artery.  Other Factors: PROCEDURE:                1. Ultrasound-guided access of right radial artery                2. Ultrasound-guided access of right common femoral artery                3. Aortogram and right upper extremity angiogram                4. Balloon angioplasty and stenting of the  right subclavian                artery                5. Balloon angioplasty and stenting of the right carotid artery                6. Exposure and direct repair of the right common carotid artery                7. Pro-glide closure of right common femoral artery                8. TR band application to the right radial artery Performing Technologist: Jenifer Miu RVT  Examination Guidelines: A complete evaluation includes B-mode imaging, spectral Doppler, color Doppler, and power Doppler as needed of all accessible portions of each vessel. Bilateral testing is considered an integral part of a complete examination. Limited examinations for reoccurring indications may be performed as noted.  Right Doppler Findings: +---------------+----------+----------+----------------------------------------+ Site           PSV (cm/s)Waveform  Comments                                 +---------------+----------+----------+----------------------------------------+ Subclavian Prox124       triphasic                                          +---------------+----------+----------+----------------------------------------+ Subclavian Mid 60        triphasic                                          +---------------+----------+----------+----------------------------------------+ Subclavian Dist50        retrograde                                         +---------------+----------+----------+----------------------------------------+ Axillary       73        triphasic                                          +---------------+----------+----------+----------------------------------------+  Brachial Prox  68        triphasic                                          +---------------+----------+----------+----------------------------------------+ Brachial Mid   97        triphasic                                          +---------------+----------+----------+----------------------------------------+ Brachial  Dist  80        triphasic                                          +---------------+----------+----------+----------------------------------------+ Radial Prox    18        biphasic  High Radial Bifucation (Distal upper                                        arm)                                     +---------------+----------+----------+----------------------------------------+ Radial Mid     9         biphasic                                           +---------------+----------+----------+----------------------------------------+ Radial Dist    6         retrogradeRetrograde at wrist; returns antegrade                                      with ulnar artery compression            +---------------+----------+----------+----------------------------------------+ Ulnar Prox     44        biphasic                                           +---------------+----------+----------+----------------------------------------+ Ulnar Mid      52        biphasic                                           +---------------+----------+----------+----------------------------------------+ Ulnar Dist     63        biphasic                                           +---------------+----------+----------+----------------------------------------+ Incidental Findings: Right Basilic Vein Superficial Thrombosis - Upper arm to Forearm  Summary:  Right: - Patent upper extremity arteries where visualized.        - High radial  artery bifucation with small caliber vessel        size throughout (0.10 cm) with no significant obstruction        identified; however, retrograde flow is observed at the wrist        and returns antegrade with ulnar artery compression        suggestive of a steal component.        - Low velocities throughout the right radial artery.         Incidental Findings: Superficial vein thrombosis of the right        basilic vein upper arm and forearm segments. *See table(s) above for  measurements and observations. Electronically signed by Genny Kid MD on 04/19/2024 at 11:47:52 AM.    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 Current stage is suspected to be III, but could be IV pending PET scan and MRI results. Surgery is not an option due to tumor spread. Treatment options include chemoradiation if stage III, or more chemotherapy and less radiation if stage IV. Chemotherapy may cause nausea, vomiting, fatigue, and alopecia. The goal for stage III is to control and potentially cure the cancer, while for stage IV, the goal is palliative care to prolong life and improve quality of life. - Order PET scan and MRI brain on June 9 to determine cancer staging. - Refer to radiation oncologist for potential chemoradiation treatment. - Plan for chemoradiation if stage III, with chemotherapy once a week and radiation for six and a half weeks. - Consider immunotherapy after chemoradiation if stage III. - If stage IV, plan for more chemotherapy and less radiation.  Weight loss Recent weight loss of approximately 6 pounds, with decreased appetite and preference for light meals like chicken noodle soup. No nausea, vomiting, or diarrhea reported.  Anxiety Reports high anxiety levels.  Hypertension Hypertension without specific discussion of current management or issues during the visit.  Hyperlipidemia Hyperlipidemia without specific discussion of current management or issues during the visit.  Subclavian artery stenosis, stented Subclavian artery stenosis, previously treated with stenting.   The patient was advised to call immediately if she has any concerning symptoms in the interval. The patient voices understanding of current disease status and treatment options  and is in agreement with the current care plan.  All questions were answered. The patient knows to call the clinic with any problems, questions or concerns. We can certainly see the patient much sooner if necessary.  Thank you so much for allowing me to participate in the care of [name]. I will continue to follow up the patient with you and assist in her care.  The total time spent in the appointment was 90 minutes.  Disclaimer: This note was dictated with voice recognition software. Similar sounding words can inadvertently be transcribed and may not be corrected upon review.   Aurelio Blower May 11, 2024, 2:28 PM

## 2024-05-11 NOTE — Progress Notes (Signed)
 I met the pt face to face today during her medical oncology consult with Dr. Marguerita Shih. Pt was accompanied by her boyfriend, Kristina Howe, and her friend, Kristina Howe. The plan for the pt is to continue her work up with a PET scan and brain MRI scheduled for 6/9 and to follow up the following week to review results and make a plan for treatment at this time. I provided the pt with a lung cancer journey book, which contained information about the [facility], the monthly lung cancer support meeting and my contact information. After all questions were answered, I escorted the pt to scheduling to make the pt's follow up appt. Pt is scheduled to return on 6/17.

## 2024-05-12 ENCOUNTER — Telehealth: Payer: Self-pay | Admitting: Radiation Oncology

## 2024-05-12 NOTE — Telephone Encounter (Signed)
 6/4 @ 9:12 am Left voicemail for patient to call our office to be schedule for a consult.

## 2024-05-12 NOTE — Telephone Encounter (Signed)
 Copied from CRM [id number]. Topic: Clinical - Request for Lab/Test Order >> May 12, 2024 10:57 AM Ambrose Junk wrote: Reason for CRM: Patient was schedule for Pet Scan & MRI at [facility] (June 9th). Insurance denying hospital location. Requesting non hospital location for testing. Please call patient to discuss moving forward.  Please advise [facility]

## 2024-05-13 ENCOUNTER — Inpatient Hospital Stay: Admitting: Licensed Clinical Social Worker

## 2024-05-13 ENCOUNTER — Encounter: Payer: Self-pay | Admitting: Acute Care

## 2024-05-13 DIAGNOSIS — C3431 Malignant neoplasm of lower lobe, right bronchus or lung: Secondary | ICD-10-CM

## 2024-05-13 NOTE — Progress Notes (Signed)
 [facility] Clinical Social Work  Initial Assessment   CAILI ESCALERA is a 63 y.o. year old female contacted by phone. Clinical Social Work was referred by medical provider for assessment of psychosocial needs.   SDOH (Social Determinants of Health) assessments performed: Yes   SDOH Screenings   Food Insecurity: No Food Insecurity (05/11/2024)  Housing: Low Risk  (05/11/2024)  Transportation Needs: No Transportation Needs (05/11/2024)  Utilities: Not At Risk (05/11/2024)  Depression (PHQ2-9): Low Risk  (05/11/2024)  Tobacco Use: Medium Risk (05/05/2024)     Distress Screen completed: No     No data to display            Family/Social Information:  Housing Arrangement: patient lives with her significant other who is retired and able to provide transportation and assistance as needed.  Family members/support persons in your life? Pt's family resides in [place] , but pt reports having friends locally that can provide additional support if needed. Transportation concerns: no  Employment: Working full time as a Location manager for [employer].  Pt has worked for [employer] for 5 years with short term disability benefits only.  Pt reports she has an [place] policy as well as a cancer policy that will offer some financial assistance while undergoing treatment.  Income source: Employment Financial concerns: No Type of concern: None Food access concerns: no Religious or spiritual practice: Yes-Lutheran Advanced directives: No, pt informed of the AD clinic available to complete documents Services Currently in place:  none  Coping/ Adjustment to diagnosis: Patient understands treatment plan and what happens next? yes Concerns about diagnosis and/or treatment: Losing my job and/or losing income, Overwhelmed by information, Afraid of cancer, and Quality of life Patient reported stressors: Anxiety/ nervousness and Adjusting to my illness Hopes and/or priorities: pt's priority is to finish  diagnostics and start treatment w/ the hope of positive results Patient enjoys not addressed Current coping skills/ strengths: Capable of independent living , Motivation for treatment/growth , Physical Health , and Supportive family/friends     SUMMARY: Current SDOH Barriers:  No barriers identified at this time.  Clinical Social Work Clinical Goal(s):  No clinical social work goals at this time  Interventions: Discussed common feeling and emotions when being diagnosed with cancer, and the importance of support during treatment Informed patient of the support team roles and support services at [facility] Provided CSW contact information and encouraged patient to call with any questions or concerns Pt reporting an increase in anxiety since diagnosis.  Pt is prescribed medication through her PCP for anxiety and requested an increase in dose post cancer diagnosis which she reports has helped.  CSW informed pt of availability for individual counseling as well as the lung cancer support group.  Pt declining these resources at this time, but will reach out to CSW if at any point she believes additional emotional support may be beneficial.   Follow Up Plan: Patient will contact CSW with any support or resource needs Patient verbalizes understanding of plan: Yes    Quenton Bruns, LCSW Clinical Social Worker [facility]

## 2024-05-14 NOTE — Telephone Encounter (Signed)
 PT calling to check on Disability ppwk. Advised her Dr. Baldwin Levee wopuld be out of office until mid-June. DOD can not sign in his place.

## 2024-05-17 ENCOUNTER — Observation Stay (HOSPITAL_COMMUNITY)
Admission: EM | Admit: 2024-05-17 | Discharge: 2024-05-18 | Disposition: A | Discharge status: Home / Self Care | Attending: Internal Medicine | Admitting: Internal Medicine

## 2024-05-17 ENCOUNTER — Encounter (HOSPITAL_COMMUNITY): Payer: Self-pay

## 2024-05-17 ENCOUNTER — Other Ambulatory Visit: Payer: Self-pay

## 2024-05-17 ENCOUNTER — Emergency Department (HOSPITAL_COMMUNITY)

## 2024-05-17 ENCOUNTER — Other Ambulatory Visit (HOSPITAL_COMMUNITY)

## 2024-05-17 ENCOUNTER — Observation Stay (HOSPITAL_COMMUNITY)

## 2024-05-17 ENCOUNTER — Ambulatory Visit (HOSPITAL_COMMUNITY)

## 2024-05-17 ENCOUNTER — Ambulatory Visit (HOSPITAL_COMMUNITY)
Admission: RE | Admit: 2024-05-17 | Discharge: 2024-05-17 | Disposition: A | Discharge status: Home / Self Care | Source: Ambulatory Visit | Attending: Acute Care | Admitting: Acute Care

## 2024-05-17 DIAGNOSIS — I10 Essential (primary) hypertension: Secondary | ICD-10-CM | POA: Diagnosis not present

## 2024-05-17 DIAGNOSIS — G936 Cerebral edema: Secondary | ICD-10-CM | POA: Diagnosis not present

## 2024-05-17 DIAGNOSIS — R0902 Hypoxemia: Principal | ICD-10-CM | POA: Diagnosis present

## 2024-05-17 DIAGNOSIS — I3139 Other pericardial effusion (noninflammatory): Secondary | ICD-10-CM | POA: Diagnosis not present

## 2024-05-17 DIAGNOSIS — Z7982 Long term (current) use of aspirin: Secondary | ICD-10-CM | POA: Insufficient documentation

## 2024-05-17 DIAGNOSIS — E785 Hyperlipidemia, unspecified: Secondary | ICD-10-CM | POA: Diagnosis not present

## 2024-05-17 DIAGNOSIS — I2699 Other pulmonary embolism without acute cor pulmonale: Secondary | ICD-10-CM | POA: Diagnosis not present

## 2024-05-17 DIAGNOSIS — R590 Localized enlarged lymph nodes: Secondary | ICD-10-CM | POA: Diagnosis not present

## 2024-05-17 DIAGNOSIS — I6782 Cerebral ischemia: Secondary | ICD-10-CM | POA: Diagnosis not present

## 2024-05-17 DIAGNOSIS — R079 Chest pain, unspecified: Secondary | ICD-10-CM | POA: Diagnosis not present

## 2024-05-17 DIAGNOSIS — G319 Degenerative disease of nervous system, unspecified: Secondary | ICD-10-CM | POA: Diagnosis not present

## 2024-05-17 DIAGNOSIS — C3431 Malignant neoplasm of lower lobe, right bronchus or lung: Secondary | ICD-10-CM | POA: Diagnosis present

## 2024-05-17 DIAGNOSIS — Z95820 Peripheral vascular angioplasty status with implants and grafts: Secondary | ICD-10-CM | POA: Insufficient documentation

## 2024-05-17 DIAGNOSIS — C3480 Malignant neoplasm of overlapping sites of unspecified bronchus and lung: Secondary | ICD-10-CM | POA: Insufficient documentation

## 2024-05-17 DIAGNOSIS — J439 Emphysema, unspecified: Secondary | ICD-10-CM | POA: Diagnosis not present

## 2024-05-17 DIAGNOSIS — R918 Other nonspecific abnormal finding of lung field: Secondary | ICD-10-CM | POA: Diagnosis not present

## 2024-05-17 DIAGNOSIS — I2510 Atherosclerotic heart disease of native coronary artery without angina pectoris: Secondary | ICD-10-CM | POA: Diagnosis not present

## 2024-05-17 HISTORY — DX: Malignant (primary) neoplasm, unspecified: C80.1

## 2024-05-17 LAB — CBC
HCT: 34.6 % — ABNORMAL LOW (ref 36.0–46.0)
Hemoglobin: 10.6 g/dL — ABNORMAL LOW (ref 12.0–15.0)
MCH: 26 pg (ref 26.0–34.0)
MCHC: 30.6 g/dL (ref 30.0–36.0)
MCV: 85 fL (ref 80.0–100.0)
Platelets: 404 10*3/uL — ABNORMAL HIGH (ref 150–400)
RBC: 4.07 MIL/uL (ref 3.87–5.11)
RDW: 14.8 % (ref 11.5–15.5)
WBC: 5.9 10*3/uL (ref 4.0–10.5)
nRBC: 0 % (ref 0.0–0.2)

## 2024-05-17 LAB — BASIC METABOLIC PANEL WITH GFR
Anion gap: 14 (ref 5–15)
BUN: 18 mg/dL (ref 8–23)
CO2: 22 mmol/L (ref 22–32)
Calcium: 10.8 mg/dL — ABNORMAL HIGH (ref 8.9–10.3)
Chloride: 101 mmol/L (ref 98–111)
Creatinine, Ser: 0.72 mg/dL (ref 0.44–1.00)
GFR, Estimated: >60 mL/min (ref >60)
Glucose, Bld: 96 mg/dL (ref 70–99)
Potassium: 3.8 mmol/L (ref 3.5–5.1)
Sodium: 137 mmol/L (ref 135–145)

## 2024-05-17 LAB — TROPONIN I (HIGH SENSITIVITY)
Troponin I (High Sensitivity): 8 ng/L (ref <18)
Troponin I (High Sensitivity): 14 ng/L (ref <18)

## 2024-05-17 LAB — HEPARIN LEVEL (UNFRACTIONATED): Heparin Unfractionated: >1.1 [IU]/mL — ABNORMAL HIGH (ref 0.30–0.70)

## 2024-05-17 LAB — BRAIN NATRIURETIC PEPTIDE: B Natriuretic Peptide: 42.3 pg/mL (ref 0.0–100.0)

## 2024-05-17 LAB — APTT: aPTT: 40 s — ABNORMAL HIGH (ref 24–36)

## 2024-05-17 MED ORDER — ACETAMINOPHEN 325 MG PO TABS
650.0000 mg | ORAL_TABLET | Freq: Four times a day (QID) | ORAL | Status: DC | PRN
  Administered 2024-05-17 – 2024-05-18 (×2): 650 mg via ORAL
  Filled 2024-05-17 (×2): qty 2

## 2024-05-17 MED ORDER — ADULT MULTIVITAMIN W/MINERALS CH
1.0000 | ORAL_TABLET | Freq: Every day | ORAL | Status: DC
  Administered 2024-05-18: 1 via ORAL
  Filled 2024-05-17: qty 1

## 2024-05-17 MED ORDER — ONDANSETRON HCL 4 MG/2ML IJ SOLN: 4.0000 mg | Freq: Four times a day (QID) | INTRAMUSCULAR | Status: DC | PRN

## 2024-05-17 MED ORDER — MAGNESIUM OXIDE -MG SUPPLEMENT 400 (240 MG) MG PO TABS
200.0000 mg | ORAL_TABLET | Freq: Two times a day (BID) | ORAL | Status: DC
  Administered 2024-05-17 – 2024-05-18 (×2): 200 mg via ORAL
  Filled 2024-05-17 (×2): qty 1

## 2024-05-17 MED ORDER — IRBESARTAN 300 MG PO TABS
300.0000 mg | ORAL_TABLET | Freq: Every day | ORAL | Status: DC
  Administered 2024-05-18: 300 mg via ORAL
  Filled 2024-05-17: qty 1

## 2024-05-17 MED ORDER — OXYCODONE HCL 5 MG PO TABS
5.0000 mg | ORAL_TABLET | ORAL | Status: DC | PRN
  Administered 2024-05-18: 5 mg via ORAL
  Filled 2024-05-17 (×2): qty 1

## 2024-05-17 MED ORDER — ALBUTEROL SULFATE (2.5 MG/3ML) 0.083% IN NEBU: 2.5000 mg | INHALATION_SOLUTION | RESPIRATORY_TRACT | Status: DC | PRN

## 2024-05-17 MED ORDER — ONDANSETRON HCL 4 MG PO TABS: 4.0000 mg | ORAL_TABLET | Freq: Four times a day (QID) | ORAL | Status: DC | PRN

## 2024-05-17 MED ORDER — ENOXAPARIN SODIUM 60 MG/0.6ML IJ SOSY
50.0000 mg | PREFILLED_SYRINGE | Freq: Two times a day (BID) | INTRAMUSCULAR | Status: DC
  Administered 2024-05-17 – 2024-05-18 (×2): 50 mg via SUBCUTANEOUS
  Filled 2024-05-17 (×4): qty 0.6

## 2024-05-17 MED ORDER — IOHEXOL 350 MG/ML SOLN
75.0000 mL | Freq: Once | INTRAVENOUS | Status: AC | PRN
  Administered 2024-05-17: 75 mL via INTRAVENOUS

## 2024-05-17 MED ORDER — ATORVASTATIN CALCIUM 40 MG PO TABS
40.0000 mg | ORAL_TABLET | Freq: Every day | ORAL | Status: DC
  Administered 2024-05-18: 40 mg via ORAL
  Filled 2024-05-17: qty 1

## 2024-05-17 MED ORDER — ALPRAZOLAM 0.5 MG PO TABS
0.5000 mg | ORAL_TABLET | Freq: Three times a day (TID) | ORAL | Status: DC | PRN
  Administered 2024-05-17 – 2024-05-18 (×2): 0.5 mg via ORAL
  Filled 2024-05-17 (×2): qty 1

## 2024-05-17 MED ORDER — MAGNESIUM 30 MG PO TABS: 30.0000 mg | ORAL_TABLET | Freq: Two times a day (BID) | ORAL | Status: DC

## 2024-05-17 MED ORDER — GUAIFENESIN-DM 100-10 MG/5ML PO SYRP
5.0000 mL | ORAL_SOLUTION | ORAL | Status: DC | PRN
  Administered 2024-05-17 – 2024-05-18 (×2): 5 mL via ORAL
  Filled 2024-05-17 (×2): qty 5

## 2024-05-17 MED ORDER — FLUOXETINE HCL 20 MG PO CAPS
20.0000 mg | ORAL_CAPSULE | Freq: Every day | ORAL | Status: DC
  Administered 2024-05-18: 20 mg via ORAL
  Filled 2024-05-17: qty 1

## 2024-05-17 MED ORDER — LORAZEPAM 2 MG/ML IJ SOLN: 1.0000 mg | Freq: Once | INTRAMUSCULAR | Status: DC

## 2024-05-17 MED ORDER — GADOBUTROL 1 MMOL/ML IV SOLN
5.0000 mL | Freq: Once | INTRAVENOUS | Status: AC | PRN
  Administered 2024-05-17: 5 mL via INTRAVENOUS

## 2024-05-17 MED ORDER — TRIAMTERENE-HCTZ 37.5-25 MG PO TABS
1.0000 | ORAL_TABLET | Freq: Every day | ORAL | Status: DC
  Administered 2024-05-18: 1 via ORAL
  Filled 2024-05-17: qty 1

## 2024-05-17 NOTE — ED Provider Notes (Signed)
 [place] EMERGENCY DEPARTMENT AT [facility] Provider Note   CSN: [id number] Arrival date & time: 05/17/24  1406     History {Add pertinent medical, surgical, social history, OB history to HPI:1} Chief Complaint  Patient presents with   Shortness of Breath    Kristina Howe is a 63 y.o. female.   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  Patient has been a chronic smoker for about 40 years.  Patient quit several months ago.  Patient was getting a PET scan.  During the procedure she had an oxygen saturation of 85%.  She was sent to the ED for further evaluation.  Home Medications Prior to Admission medications   Medication Sig Start Date End Date Taking? Authorizing Provider  ALPRAZolam  (XANAX ) 0.5 MG tablet Take 0.5 mg by mouth at bedtime.    [provider]  apixaban  (ELIQUIS ) 5 MG TABS tablet Take 1 tablet (5 mg total) by mouth 2 (two) times daily. 04/09/24 06/08/24  Philipp Brawn, MD  APIXABAN  (ELIQUIS ) VTE STARTER PACK (10MG  AND 5MG ) Take as directed on package: start with two-5mg  tablets twice daily for 7 days. On day 8, switch to one-5mg  tablet twice daily. 03/12/24   Haydee Lipa, MD  aspirin  81 MG chewable tablet Chew 81 mg by mouth daily. For right forearm pain; thrombophlebitis of her superficial basilic forearm veins- instructed her to take ASA 81 mg & warm compresses 04/19/24   [provider]  atorvastatin  (LIPITOR) 40 MG tablet Take 1 tablet (40 mg total) by mouth daily. 03/12/24   Haydee Lipa, MD  FLUoxetine (PROZAC) 20 MG capsule Take 20 mg by mouth daily.    [provider]  magnesium 30 MG tablet Take 30 mg by mouth 2 (two) times daily.    [provider]  Multiple Vitamins-Minerals (MULTIVITAMIN WITH MINERALS) tablet Take 1 tablet by mouth daily.    [provider]  NIFEdipine  (ADALAT   CC) 30 MG 24 hr tablet Take 1 tablet (30 mg total) by mouth daily. 04/19/24   Cordie Deters, PA-C  triamterene-hydrochlorothiazide (MAXZIDE-25) 37.5-25 MG per tablet Take 1 tablet by mouth daily.      [provider]  valsartan (DIOVAN) 320 MG tablet Take 320 mg by mouth daily. 12/31/23   [provider]      Allergies    Patient has no known allergies.    Review of Systems   Review of Systems  Respiratory:  Positive for shortness of breath.     Physical Exam Updated Vital Signs BP (!) 148/89   Pulse 83   Temp 97.7 F (36.5 C) (Oral)   Resp (!) 25   Ht 1.702 m (5\' 7" )   Wt 47.6 kg   SpO2 95%   BMI 16.45 kg/m  Physical Exam  ED Results / Procedures / Treatments   Labs (all labs ordered are listed, but only abnormal results are displayed) Labs Reviewed - No data to display  EKG EKG Interpretation Date/Time:  Monday May 17 2024 14:10:06 EDT Ventricular Rate:  82 PR Interval:  153 QRS Duration:  97 QT Interval:  381 QTC Calculation: 445 R Axis:   -44  Text Interpretation: Sinus rhythm Left axis deviation Minimal ST elevation, lateral leads No significant change since last tracing Confirmed by Trish Furl [phone]) on 05/17/2024 2:16:02 PM  Radiology No results found.  Procedures Procedures  {Document cardiac monitor, telemetry assessment procedure when appropriate:1}  Medications Ordered in ED Medications - No data to display  ED Course/ Medical Decision Making/ A&P   {   Click here for ABCD2, HEART and other calculatorsREFRESH Note before signing :1}                              Medical Decision Making Amount and/or Complexity of Data Reviewed Labs: ordered. Radiology: ordered.   ***  {Document critical care time when appropriate:1} {Document review of labs and clinical decision tools ie heart score, Chads2Vasc2 etc:1}  {Document your independent review of radiology images, and any outside records:1} {Document your discussion with  family members, caretakers, and with consultants:1} {Document social determinants of health affecting pt's care:1} {Document your decision making why or why not admission, treatments were needed:1} Final Clinical Impression(s) / ED Diagnoses Final diagnoses:  None    Rx / DC Orders ED Discharge Orders     None

## 2024-05-17 NOTE — ED Notes (Signed)
 Pt in MRI.

## 2024-05-17 NOTE — Progress Notes (Signed)
 PHARMACY - ANTICOAGULATION CONSULT NOTE  Pharmacy Consult for enoxaparin  Indication: pulmonary embolus  No Known Allergies  Patient Measurements: Height: 5\' 7"  (170.2 cm) Weight: 47.6 kg (105 lb) IBW/kg (Calculated) : 61.6 HEPARIN  DW (KG): 47.6  Vital Signs: Temp: 97.7 F (36.5 C) (06/09 1409) Temp Source: Oral (06/09 1409) BP: 142/84 (06/09 1500) Pulse Rate: 78 (06/09 1500)  Labs: Recent Labs    05/17/24 1415  HGB 10.6*  HCT 34.6*  PLT 404*  CREATININE 0.72  TROPONINIHS 8    Estimated Creatinine Clearance: 55.5 mL/min (by C-G formula based on SCr of 0.72 mg/dL).   Medical History: Past Medical History:  Diagnosis Date   Anemia 04/02/2024   hgb = 9   Anxiety    tx w/ xanax  and celexa    Cancer (HCC)    Elevated cholesterol    Hypertension    Subclavian artery stenosis, right (HCC) 04/19/2024   right forearm pain;  thrombophlebitis of her superficial basilic forearm veins- instructed her to take ASA 81 mg & warm compresses    Medications: Apixaban  5 mg PO BID PTA -Last dose was reported taken on 6/9 ~08:00 am  Assessment: Pt is a 83 yoF with PMH significant for lung cancer, recently diagnosed PE in April 2025 and started on apixaban . Pt presenting with chest pain, SOB. CTA revealed PE, no evidence of RHS despite compliance with Eliquis .   Pharmacy consulted to dose enoxaparin .   Today, 05/17/24 CBC: Hgb 10.6, Plt 404 SCr <1, CrCl 55 mL/min Total body weight = 48 kg  Goal of Therapy:  Anti-Xa level 0.6-1 units/ml 4hrs after LMWH dose given Monitor platelets by anticoagulation protocol: Yes   Plan:  Enoxaparin  1 mg/kg (50 mg) subQ q12h CBC, SCr with AM labs tomorrow Monitor for signs of bleeding  Shireen Dory, PharmD 05/17/2024,4:47 PM

## 2024-05-17 NOTE — H&P (Signed)
 History and Physical    MACIAH FEEBACK Howe:[id number] DOB: 1961/09/01 DOA: 05/17/2024  PCP: Jannelle Memory, MD (Inactive)  Patient coming from: Home via imaging center  I have personally briefly reviewed patient's old medical records available.   Chief Complaint: Chest pain, shortness of breath.  HPI: Kristina Howe is a 63 y.o. female with medical history significant of recently diagnosed pulmonary embolism on April 2025.  She was diagnosed with digital cyanosis and ischemia and underwent balloon angioplasty and stenting.  On investigation of ischemia and pulmonary embolism, she was found to have metastatic lymphadenopathy and bilateral pulmonary nodules, pathological right paratracheal and subcarinal adenopathy up to 4 cm, pulmonary nodules with 9 mm right lower lobe spiculated nodule.  Patient is 90 pack/year smoker.  She was since then on Eliquis  with 2 days of Eliquis  interruption on 5/9 for bronchial biopsy.  Patient does have a history of hypertension and hyperlipidemia and anxiety. Patient has been following up with cancer center and scheduled for PET scan today.  Patient is more short of breath, having diffuse chest wall pain and extreme anxiety for about 1 week now.  She presented to PET scan earlier today and found to have significantly dyspneic, 85% on room air.  A rapid response was called and patient was brought to the emergency room.  Patient with oxygen saturation 89% on room air, repeat CT angiogram of the chest shows nonocclusive bilateral pulmonary embolism, bronchial wall thickening, known cancer.  Last dose of Eliquis  today morning. On my interview, patient is tearful.  She tells me she is always short of breath.  Patient tells me that it hurts to take deep breaths.  Poor appetite.  She is also having mild headache as she has not eaten anything today morning.  Denies any fever or chills.  Denies any nausea or vomiting.  Denies any dizziness lightheadedness or passing out spells.   Patient could not finish PET scan today.  ED Course: 85% on room air, 92% on 2 L oxygen at rest.  CT scan with above findings.  No evidence of infarction.  No evidence of right heart strain.  Case discussed with oncology by ER physician, recommended anticoagulation with heparin .  Will start patient on Lovenox  so she can potentially be discharged on Lovenox . Her biopsy consistent with non-small cell lung cancer likely squamous cell carcinoma.  Review of Systems: all systems are reviewed and pertinent positive as per HPI otherwise rest are negative.    Past Medical History:  Diagnosis Date   Anemia 04/02/2024   hgb = 9   Anxiety    tx w/ xanax  and celexa    Cancer (HCC)    Elevated cholesterol    Hypertension    Subclavian artery stenosis, right (HCC) 04/19/2024   right forearm pain;  thrombophlebitis of her superficial basilic forearm veins- instructed her to take ASA 81 mg & warm compresses    Past Surgical History:  Procedure Laterality Date   ABDOMINAL HYSTERECTOMY     AORTOGRAM N/A 03/11/2024   Procedure: AORTIC ARCH ANGIOGRAM;  Surgeon: Philipp Brawn, MD;  Location: [facility] OR;  Service: Vascular;  Laterality: N/A;   BRONCHIAL BIOPSY  04/26/2024   Procedure: BRONCHOSCOPY, WITH BIOPSY;  Surgeon: Denson Flake, MD;  Location: [facility] ENDOSCOPY;  Service: Pulmonary;;   BRONCHIAL BRUSHINGS  04/26/2024   Procedure: BRONCHOSCOPY, WITH BRUSH BIOPSY;  Surgeon: Denson Flake, MD;  Location: [facility] ENDOSCOPY;  Service: Pulmonary;;   BRONCHIAL NEEDLE ASPIRATION BIOPSY  04/26/2024   Procedure:  BRONCHOSCOPY, WITH NEEDLE ASPIRATION BIOPSY;  Surgeon: Denson Flake, MD;  Location: [facility] ENDOSCOPY;  Service: Pulmonary;;   BRONCHIAL WASHINGS  04/26/2024   Procedure: IRRIGATION, BRONCHUS;  Surgeon: Denson Flake, MD;  Location: [facility] ENDOSCOPY;  Service: Pulmonary;;   BRONCHOSCOPY, WITH BIOPSY USING ELECTROMAGNETIC NAVIGATION Right 04/26/2024   Procedure: BRONCHOSCOPY, WITH BIOPSY USING ELECTROMAGNETIC  NAVIGATION;  Surgeon: Denson Flake, MD;  Location: [facility] ENDOSCOPY;  Service: Pulmonary;  Laterality: Right;   COLONOSCOPY  2024   INSERTION OF RETROGRADE CAROTID STENT Right 03/11/2024   Procedure: INSERTION RIGHT SUBCLAVIAN ARTERY AND RIGHT COMMON CAROTID ARTERY STENTS;  Surgeon: Philipp Brawn, MD;  Location: [facility] OR;  Service: Vascular;  Laterality: Right;   THORACIC EXPOSURE Right 03/11/2024   Procedure: DIRECT EXPOSURE OF RIGHT COMMON CAROTID ARTERY;  Surgeon: Philipp Brawn, MD;  Location: [facility] OR;  Service: Vascular;  Laterality: Right;   ULTRASOUND GUIDANCE FOR VASCULAR ACCESS Right 03/11/2024   Procedure: ULTRASOUND GUIDANCE, FOR VASCULAR ACCESS, RIGHT RADIAL ARTERY AND RIGHT FEMORAL ARTERY;  Surgeon: Philipp Brawn, MD;  Location: [facility] OR;  Service: Vascular;  Laterality: Right;   UPPER EXTREMITY ANGIOGRAM Right 03/11/2024   Procedure: [employer], RIGHT UPPER EXTREMITY;  Surgeon: Philipp Brawn, MD;  Location: [facility] OR;  Service: Vascular;  Laterality: Right;   UPPER GI ENDOSCOPY  2022   VIDEO BRONCHOSCOPY WITH ENDOBRONCHIAL ULTRASOUND Right 04/26/2024   Procedure: BRONCHOSCOPY, WITH EBUS;  Surgeon: Denson Flake, MD;  Location: [facility] ENDOSCOPY;  Service: Pulmonary;  Laterality: Right;  Possible robotic navigation as well depending on CT results    Social history   reports that she quit smoking about 30 years ago. Her smoking use included cigarettes. She started smoking about 1 months ago. She has never used smokeless tobacco. She reports that she does not currently use alcohol. She reports that she does not use drugs.  No Known Allergies  History reviewed. No pertinent family history.   Prior to Admission medications   Medication Sig Start Date End Date Taking? Authorizing Provider  ALPRAZolam  (XANAX ) 0.5 MG tablet Take 0.5 mg by mouth 3 (three) times daily as needed for sleep. 05/04/24 06/03/24 Yes [provider]  aspirin  81 MG chewable tablet Chew 81 mg by mouth daily. For  right forearm pain; thrombophlebitis of her superficial basilic forearm veins- instructed her to take ASA 81 mg & warm compresses 04/19/24  Yes [provider]  atorvastatin  (LIPITOR) 40 MG tablet Take 1 tablet (40 mg total) by mouth daily. 03/12/24  Yes Haydee Lipa, MD  FLUoxetine (PROZAC) 20 MG capsule Take 20 mg by mouth daily.   Yes [provider]  magnesium 30 MG tablet Take 30 mg by mouth 2 (two) times daily.   Yes [provider]  Multiple Vitamins-Minerals (MULTIVITAMIN WITH MINERALS) tablet Take 1 tablet by mouth daily.   Yes [provider]  triamterene-hydrochlorothiazide (MAXZIDE-25) 37.5-25 MG per tablet Take 1 tablet by mouth daily.     Yes [provider]  valsartan (DIOVAN) 320 MG tablet Take 320 mg by mouth daily. 12/31/23  Yes [provider]  NIFEdipine  (ADALAT  CC) 30 MG 24 hr tablet Take 1 tablet (30 mg total) by mouth daily. Patient not taking: Reported on 05/17/2024 04/19/24   Cordie Deters, PA-C    Physical Exam: Vitals:   05/17/24 1408 05/17/24 1409 05/17/24 1412 05/17/24 1500  BP:  (!) 148/89  (!) 142/84  Pulse:  83  78  Resp:  (!) 25  19  Temp:  97.7 F (36.5 C)    TempSrc:  Oral    SpO2:  95% 95% 98%  Weight: 47.6 kg     Height: 5\' 7"  (1.702 m)       Constitutional: Frail.  Debilitated.  Anxious and tearful. Vitals:   05/17/24 1408 05/17/24 1409 05/17/24 1412 05/17/24 1500  BP:  (!) 148/89  (!) 142/84  Pulse:  83  78  Resp:  (!) 25  19  Temp:  97.7 F (36.5 C)    TempSrc:  Oral    SpO2:  95% 95% 98%  Weight: 47.6 kg     Height: 5\' 7"  (1.702 m)      Eyes: PERRL, lids and conjunctivae normal ENMT: Mucous membranes are moist. Posterior pharynx clear of any exudate or lesions.Normal dentition.  Neck: normal, supple, no masses, no thyromegaly Respiratory: Anxious and tearful.  Bilateral conducted upper airway sounds.  Poor inspiratory effort.  On room air and 89%. Cardiovascular: Regular rate  and rhythm, no murmurs / rubs / gallops. No extremity edema. 2+ pedal pulses. No carotid bruits.  Tachycardic. Abdomen: no tenderness, no masses palpated. No hepatosplenomegaly. Bowel sounds positive.  Musculoskeletal: no clubbing / cyanosis. No joint deformity upper and lower extremities. Good ROM, no contractures. Normal muscle tone.  Skin: no rashes, lesions, ulcers. No induration Neurologic: CN 2-12 grossly intact. Sensation intact, DTR normal. Strength 5/5 in all 4.  Psychiatric: Normal judgment and insight. Alert and oriented x 3.  Anxious.    Labs on Admission: I have personally reviewed following labs and imaging studies  CBC: Recent Labs  Lab 05/11/24 1342 05/17/24 1415  WBC 7.7 5.9  NEUTROABS 6.1  --   HGB 10.8* 10.6*  HCT 33.5* 34.6*  MCV 82.1 85.0  PLT 378 404*   Basic Metabolic Panel: Recent Labs  Lab 05/11/24 1342 05/17/24 1415  NA 139 137  K 4.1 3.8  CL 101 101  CO2 25 22  GLUCOSE 104* 96  BUN 17 18  CREATININE 0.82 0.72  CALCIUM  10.9* 10.8*   GFR: Estimated Creatinine Clearance: 55.5 mL/min (by C-G formula based on SCr of 0.72 mg/dL). Liver Function Tests: Recent Labs  Lab 05/11/24 1342  AST 17  ALT 8  ALKPHOS 96  BILITOT 0.6  PROT 8.5*  ALBUMIN  4.5   No results for input(s): "LIPASE", "AMYLASE" in the last 168 hours. No results for input(s): "AMMONIA" in the last 168 hours. Coagulation Profile: No results for input(s): "INR", "PROTIME" in the last 168 hours. Cardiac Enzymes: No results for input(s): "CKTOTAL", "CKMB", "CKMBINDEX", "TROPONINI" in the last 168 hours. BNP (last 3 results) No results for input(s): "PROBNP" in the last 8760 hours. HbA1C: No results for input(s): "HGBA1C" in the last 72 hours. CBG: No results for input(s): "GLUCAP" in the last 168 hours. Lipid Profile: No results for input(s): "CHOL", "HDL", "LDLCALC", "TRIG", "CHOLHDL", "LDLDIRECT" in the last 72 hours. Thyroid Function Tests: No results for input(s):  "TSH", "T4TOTAL", "FREET4", "T3FREE", "THYROIDAB" in the last 72 hours. Anemia Panel: No results for input(s): "VITAMINB12", "FOLATE", "FERRITIN", "TIBC", "IRON", "RETICCTPCT" in the last 72 hours. Urine analysis:    Component Value Date/Time   COLORURINE YELLOW 04/12/2022 1117   APPEARANCEUR CLEAR 04/12/2022 1117   LABSPEC 1.011 04/12/2022 1117   PHURINE 7.0 04/12/2022 1117   GLUCOSEU NEGATIVE 04/12/2022 1117   HGBUR NEGATIVE 04/12/2022 1117   BILIRUBINUR NEGATIVE 04/12/2022 1117   KETONESUR NEGATIVE 04/12/2022 1117   PROTEINUR NEGATIVE 04/12/2022 1117   NITRITE NEGATIVE  04/12/2022 1117   LEUKOCYTESUR NEGATIVE 04/12/2022 1117    Radiological Exams on Admission: DG Chest Portable 1 View Result Date: 05/17/2024 CLINICAL DATA:  Chest pain. EXAM: PORTABLE CHEST 1 VIEW COMPARISON:  Radiographs 04/26/2024 and 12/14/2021. Chest CT 04/21/2024. FINDINGS: 1509 hours. Previously demonstrated right paratracheal adenopathy may be slightly progressive. The heart size is stable. There are right subclavian and right common carotid vascular stents. Pulmonary nodularity seen by CT is not well visualized. The lungs appear clear. No evidence of pleural effusion or pneumothorax. The bones appear unchanged. IMPRESSION: 1. No evidence of acute cardiopulmonary process. 2. Possible slight progression of known right paratracheal adenopathy, remaining suspicious for malignancy. Electronically Signed   By: Elmon Hagedorn M.D.   On: 05/17/2024 16:46   CT ANGIO CHEST PE W OR WO CONTRAST Result Date: 05/17/2024 CLINICAL DATA:  Pulmonary embolism (PE) suspected, high prob. EXAM: CT ANGIOGRAPHY CHEST WITH CONTRAST TECHNIQUE: Multidetector CT imaging of the chest was performed using the standard protocol during bolus administration of intravenous contrast. Multiplanar CT image reconstructions and MIPs were obtained to evaluate the vascular anatomy. RADIATION DOSE REDUCTION: This exam was performed according to the  departmental dose-optimization program which includes automated exposure control, adjustment of the mA and/or kV according to patient size and/or use of iterative reconstruction technique. CONTRAST:  75mL OMNIPAQUE  IOHEXOL  350 MG/ML SOLN COMPARISON:  CT scan chest from 04/21/2024. FINDINGS: Cardiovascular: There is small-to-moderate volume predominantly nonocclusive embolism involving the segmental and proximal subsegmental pulmonary artery branches throughout bilateral lungs (marked with electronic arrow sign on series 4). There is no right heart strain or lung infarction. Normal cardiac size. Physiological pericardial effusion. No aortic aneurysm. There are coronary artery calcifications, in keeping with coronary artery disease. There are also mild peripheral atherosclerotic vascular calcifications of thoracic aorta and its major branches. Mediastinum/Nodes: Visualized thyroid gland appears grossly unremarkable. 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 The mass also abuts ascending aorta, aortic arch and descending thoracic aorta. The fat planes between the mass and the thoracic esophagus are also lost. The esophagus is nondistended precluding optimal assessment. There is mild circumferential thickening of the mid/lower thoracic esophagus, which is most likely seen in the settings of chronic gastroesophageal reflux disease versus esophagitis. No axillary lymphadenopathy by size criteria. Lungs/Pleura: The trachea and left bronchial tree are patent. Redemonstration of an approximately 8 x 17 mm soft tissue nodular thickening along the left wall of the bronchus intermedius, which is similar to the prior study. There are extensive frothy soft tissue in the bronchus intermedius and right lower lobe bronchial tree, favoring mucus/secretions versus aspiration. There  are mild-to-moderate upper lobe predominant centrilobular emphysematous changes. There are patchy areas of linear, plate-like atelectasis and/or scarring throughout bilateral lungs. Redemonstration of several in regular solid noncalcified nodules in bilateral lungs, described as follows: *Linear configuration 4 x 19 mm nodule in the left lung upper lobe (series 12, image 34), unchanged since the prior study. *There is a slightly spiculated 7 x 12 mm nodule in the right lung lower lobe (series 12, image 91), unchanged since the prior study. *There are additional stable subcentimeter sized nodularity in the right upper lobe (series 12, images 62 and 78), unchanged since the prior study. No new or suspicious lung nodule. No new mass or consolidation. No pleural effusion or pneumothorax. Upper Abdomen: There is infiltrating soft tissue surrounding aorta in the visualized portion of  upper abdomen which is also grossly similar to the prior study. Remaining visualized upper abdominal viscera within normal limits. Musculoskeletal: The visualized soft tissues of the chest wall are grossly unremarkable. No suspicious osseous lesions. There are mild multilevel degenerative changes in the visualized spine. L1 superior endplate Schmorl's node again seen. Review of the MIP images confirms the above findings. IMPRESSION: 1. There is small-to-moderate volume predominantly nonocclusive embolism involving the segmental and proximal subsegmental pulmonary artery branches throughout bilateral lungs. No right heart strain or lung infarction. 2. Redemonstration of extensive mediastinal soft tissue as well as soft tissue surrounding aorta in the visualized portion of upper abdomen which are grossly similar to the prior study. Findings favor lymphoma versus other metastatic disease. 3. There are extensive frothy soft tissue in the bronchus intermedius and right lower lobe bronchial tree, favoring mucus/secretions versus aspiration. Stable  soft tissue nodular thickening along the left wall of the bronchus intermedius. 4. Stable pre-existing lung nodules, as described above. 5. Multiple other nonacute observations, as described above. Aortic Atherosclerosis (ICD10-I70.0) and Emphysema (ICD10-J43.9). Critical Value/emergent results were called by telephone at the time of interpretation on 05/17/2024 at 4:34 pm to provider Dr. Scarlette Currier, who verbally acknowledged these results. Electronically Signed   By: Beula Brunswick M.D.   On: 05/17/2024 16:36    EKG: Independently reviewed.  Sinus tachycardia.  No acute changes.  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  Acute hypoxemia due to lung cancer and pulmonary embolism: Pulmonary embolism despite being on anticoagulation therapy with Eliquis , severe thromboembolism related to active malignancy. Agree with observation. Admit to telemetry monitor. Start Lovenox , patient to learn Lovenox  injection administration in anticipation of going home with Lovenox . Chest physiotherapy, incentive spirometry, adequate pain medications and anxiety medications. Check ambulatory sats, will likely need oxygen to go home with.  2.  Metastatic non-small cell lung cancer: Staging workup not done yet. Patient could not finish PET scan, needs to be discharged to do the chest. Patient could not finish MRI of the brain, we can do MRI of the brain with and without contrast while in the hospital. Followed by oncology with probable plan to start on chemotherapy after staging of the tumor.  3.  Cancer related anxiety and pain: Will start patient on Xanax  and oxycodone .  Nausea medications.  May need prescription on discharge.  4.  Subclavian stenosis status post stenting: Patient now therapeutic on Lovenox .  Currently not an active issue.  5.  Hyperlipidemia: On statin.   Continue.   DVT prophylaxis: Lovenox  Code Status: Full code Family Communication: Significant other at the bedside Disposition Plan: Likely home with Lovenox  injection Consults called: Oncology by ER Admission status: Observation, cardiac monitor   Vada Garibaldi MD [facility]

## 2024-05-17 NOTE — ED Triage Notes (Signed)
 Patient had rapid response called on her while about to start a PET scan. Patients oxygen room air was 85%. New diagnosis of lung cancer. Has right sided chest pain on arrival.

## 2024-05-17 NOTE — ED Provider Notes (Signed)
  Physical Exam  BP (!) 148/89   Pulse 83   Temp 97.7 F (36.5 C) (Oral)   Resp (!) 25   Ht 5\' 7"  (1.702 m)   Wt 47.6 kg   SpO2 95%   BMI 16.45 kg/m   Physical Exam  Procedures  Procedures  ED Course / MDM    Medical Decision Making Amount and/or Complexity of Data Reviewed Labs: ordered. Radiology: ordered.  Risk Prescription drug management.   ***  History of PE 03/2024, smoking, Recent diagnosis of non-small cell lung cancer found to be hypoxic at PET scan today, labs pending, CT PE study pending.   Small to moderatve volume bilaterally, no right heart strain, no infarction. Extensive mediastinal lymphadenopathy.

## 2024-05-18 ENCOUNTER — Telehealth (HOSPITAL_COMMUNITY): Payer: Self-pay | Admitting: Pharmacy Technician

## 2024-05-18 ENCOUNTER — Other Ambulatory Visit (HOSPITAL_COMMUNITY): Payer: Self-pay

## 2024-05-18 DIAGNOSIS — R0902 Hypoxemia: Secondary | ICD-10-CM | POA: Diagnosis not present

## 2024-05-18 LAB — CBC
HCT: 31 % — ABNORMAL LOW (ref 36.0–46.0)
Hemoglobin: 9.7 g/dL — ABNORMAL LOW (ref 12.0–15.0)
MCH: 26.6 pg (ref 26.0–34.0)
MCHC: 31.3 g/dL (ref 30.0–36.0)
MCV: 85.2 fL (ref 80.0–100.0)
Platelets: 343 10*3/uL (ref 150–400)
RBC: 3.64 MIL/uL — ABNORMAL LOW (ref 3.87–5.11)
RDW: 14.9 % (ref 11.5–15.5)
WBC: 6.8 10*3/uL (ref 4.0–10.5)
nRBC: 0 % (ref 0.0–0.2)

## 2024-05-18 LAB — CREATININE, SERUM
Creatinine, Ser: 0.74 mg/dL (ref 0.44–1.00)
GFR, Estimated: >60 mL/min (ref >60)

## 2024-05-18 MED ORDER — OXYCODONE HCL 5 MG PO TABS
5.0000 mg | ORAL_TABLET | Freq: Four times a day (QID) | ORAL | 0 refills | Status: DC | PRN
Start: 2024-05-18 — End: 2024-06-28
  Filled 2024-05-18: qty 30, 8d supply, fill #0

## 2024-05-18 MED ORDER — ENOXAPARIN SODIUM 60 MG/0.6ML IJ SOSY
50.0000 mg | PREFILLED_SYRINGE | Freq: Two times a day (BID) | INTRAMUSCULAR | 0 refills | Status: DC
  Filled 2024-05-18: qty 36, 30d supply, fill #0

## 2024-05-18 MED ORDER — ALBUTEROL SULFATE HFA 108 (90 BASE) MCG/ACT IN AERS
2.0000 | INHALATION_SPRAY | Freq: Four times a day (QID) | RESPIRATORY_TRACT | 2 refills | Status: DC | PRN
  Filled 2024-05-18: qty 6.7, 30d supply, fill #0

## 2024-05-18 NOTE — Telephone Encounter (Signed)
 Patient Product/process development scientist completed.    The patient is insured through [employer]. Patient has [employer], may use a copay card, and/or apply for patient assistance if available.    Ran test claim for enoxaparin  (Lovenox ) 60 mg/0.6 ml and the current 30 day co-pay is $0.00.   This test claim was processed through [facility]- copay amounts may vary at other pharmacies due to pharmacy/plan contracts, or as the patient moves through the different stages of their insurance plan.     Morgan Arab, CPHT Pharmacy Technician III Certified Patient Advocate [facility] Patient Advocate Team Direct Number: [phone]  Fax: [phone]

## 2024-05-18 NOTE — Progress Notes (Signed)
 SATURATION QUALIFICATIONS: (This note is used to comply with regulatory documentation for home oxygen)  Patient Saturations on Room Air at Rest = 96%  Patient Saturations on Room Air while Ambulating = 89%  Patient Saturations on 2 Liters of oxygen while Ambulating = 99%  Please briefly explain why patient needs home oxygen:  Patient is experiencing shortness of breath while ambulating. Patient states that she also is experiencing anxiety when shortness of breath occurs.

## 2024-05-18 NOTE — Discharge Summary (Signed)
 Physician Discharge Summary  Kristina Howe ZOX:[id number] DOB: [birth year] DOA: 05/17/2024  PCP: Jannelle Memory, MD (Inactive)  Admit date: 05/17/2024 Discharge date: 05/18/2024  Admitted From: Home Disposition: Home  Recommendations for Outpatient Follow-up:  Follow up with PCP in 1-2 weeks Please obtain BMP/CBC in one week Follow-up with oncology clinic as scheduled  Home Health: N/A Equipment/Devices: Oxygen  Discharge Condition: Stable CODE STATUS: Full code Diet recommendation: Regular diet, nutritional supplements  Discharge summary: 63 year old with recently diagnosed pulmonary embolism after she was found to have digital cyanosis and ischemia.  Subsequently found to have metastatic lymphadenopathy and bilateral pulmonary nodules and currently getting prepared for treatment.  She presented for a scheduled PET scan however was short of breath and could not lay flat, hypoxemic she was sent to the ER.  In the emergency room a CT angiogram showed persistent pulmonary embolism despite being on Eliquis .  She was 85% and subsequently 88% on room air with mobility.  Complaining of chest pain and discomfort.  Monitored overnight.  Started on Lovenox .  Also started on pain medications with improved clinically.  Pulm embolism, failed oral anticoagulation.  Thromboembolism secondary to underlying malignancy. PE despite being on Eliquis .  Recommended Lovenox .  Patient is going home with Lovenox . Clinically improved.  88-89% on room air with mobility.  Will benefit with oxygen. Patient did good clinical response on oral opiates this to be prescribed.  She also will be taking laxative regimen. Patient is already on Xanax  for anxiety that she will continue. Patient is comfortable going home with Lovenox  injections.  She will follow-up at cancer center, needs to reschedule PET scan.  PET scan cannot be done as inpatient.  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 Stable for discharge with outpatient follow-up.  Updated patient's oncologist.    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 [facility])    Discharge Instructions  Discharge Instructions     Diet general   Complete by: As directed    Increase activity slowly   Complete by: As directed       Allergies as of 05/18/2024   No Known Allergies      Medication List     STOP taking these medications    apixaban  5 MG Tabs tablet Commonly known as: ELIQUIS    aspirin  81 MG chewable tablet   NIFEdipine  30 MG 24 hr tablet Commonly known as: ADALAT  CC       TAKE these medications    albuterol  108 (90 Base) MCG/ACT inhaler Commonly known as: VENTOLIN  HFA Inhale 2 puffs into the lungs every 6 (six) hours as needed for wheezing or shortness of breath.   ALPRAZolam  0.5 MG tablet Commonly known as: XANAX  Take 0.5 mg by mouth 3 (three) times daily as needed for sleep.   atorvastatin  40 MG tablet Commonly known as: LIPITOR Take 1 tablet (40 mg total) by mouth daily.   enoxaparin  60 MG/0.6ML injection Commonly known as: LOVENOX  Inject 0.5 mLs (50 mg total) into the skin every 12 (twelve) hours.   FLUoxetine 20 MG capsule Commonly known as: PROZAC Take 20 mg by mouth daily.   magnesium 30 MG tablet Take 30 mg by mouth 2 (two) times daily.   multivitamin with minerals tablet Take 1 tablet by mouth daily.   oxyCODONE  5 MG immediate  release tablet Commonly known as: Oxy IR/ROXICODONE  Take 1 tablet (5 mg total) by mouth every 6 (six) hours as needed for severe pain (pain score 7-10).   triamterene-hydrochlorothiazide 37.5-25 MG tablet Commonly known as: MAXZIDE-25 Take 1 tablet by mouth daily.   valsartan 320 MG tablet Commonly known as: DIOVAN Take 320 mg by mouth daily.               Durable Medical  Equipment  (From admission, onward)           Start     Ordered   05/18/24 1016  For home use only DME oxygen  Once       Comments: Patient Saturations on Room Air at Rest = 96% Patient Saturations on [employer] while Ambulating = 89% Patient Saturations on 2 Liters of oxygen while Ambulating = 99%  Question Answer Comment  Length of Need Lifetime   Mode or (Route) Nasal cannula   Liters per Minute 2   Frequency Continuous (stationary and portable oxygen unit needed)   Oxygen conserving device Yes   Oxygen delivery system Gas      05/18/24 1015            No Known Allergies  Consultations: Oncology, curbside   Procedures/Studies: MR BRAIN W WO CONTRAST Result Date: 05/17/2024 CLINICAL DATA:  Initial evaluation for metastatic disease. EXAM: MRI HEAD WITHOUT AND WITH CONTRAST TECHNIQUE: Multiplanar, multiecho pulse sequences of the brain and surrounding structures were obtained without and with intravenous contrast. CONTRAST:  5mL GADAVIST GADOBUTROL 1 MMOL/ML IV SOLN COMPARISON:  None Available. FINDINGS: Brain: Mild age-related cerebral atrophy. Patchy T2/FLAIR hyperintensity involving the periventricular deep white matter both cerebral hemispheres, most like related chronic microvascular ischemic disease, moderately advanced in nature. No evidence for acute or subacute ischemia. No areas of chronic cortical infarction. No acute intracranial hemorrhage. 1.2 cm rim enhancing lesion positioned along the gray-white matter differentiation of the left temporal occipital junction (series 17, image 57). Mild localized vasogenic edema without significant regional mass effect. Associated susceptibility artifact consistent with blood products and/or necrosis. Finding suspicious for a small metastatic lesion given provided history. There is question of an additional 5 mm focus of enhancement at the superior right cerebellum (series 18, image 8). Associated susceptibility artifact at this location  (series 10, image 12). Finding is nonspecific, and could reflect a small additional metastatic lesion or possibly small vascular lesion. No other visible intracranial metastases. No other pathologic enhancement. Ventricles normal size without hydrocephalus. No extra-axial fluid collection. Pituitary gland and suprasellar region within normal limits. Vascular: Major intracranial vascular flow voids are maintained. Skull and upper cervical spine: Craniocervical junction within normal limits. Bone marrow signal intensity normal. No scalp soft tissue abnormality. Sinuses/Orbits: Globes and orbital soft tissues within normal limits. Paranasal sinuses are largely clear. Trace left mastoid effusion noted, of doubtful significance. Other: None. IMPRESSION: 1. 1.2 cm rim enhancing lesion positioned at the left temporoccipital junction, suspicious for a metastatic lesion given provided history. Mild localized edema without significant regional mass effect. 2. Question additional 5 mm focus of enhancement at the superior right cerebellum, nonspecific, and could reflect an additional metastasis or possibly a small vascular lesion. Attention at follow-up recommended. 3. No other acute intracranial abnormality. 4. Underlying age-related cerebral atrophy with moderately advanced chronic microvascular ischemic disease. Electronically Signed   By: Virgia Griffins M.D.   On: 05/17/2024 21:08   DG Chest Portable 1 View Result Date: 05/17/2024 CLINICAL DATA:  Chest pain. EXAM: PORTABLE  CHEST 1 VIEW COMPARISON:  Radiographs 04/26/2024 and 12/14/2021. Chest CT 04/21/2024. FINDINGS: 1509 hours. Previously demonstrated right paratracheal adenopathy may be slightly progressive. The heart size is stable. There are right subclavian and right common carotid vascular stents. Pulmonary nodularity seen by CT is not well visualized. The lungs appear clear. No evidence of pleural effusion or pneumothorax. The bones appear unchanged.  IMPRESSION: 1. No evidence of acute cardiopulmonary process. 2. Possible slight progression of known right paratracheal adenopathy, remaining suspicious for malignancy. Electronically Signed   By: Elmon Hagedorn M.D.   On: 05/17/2024 16:46   CT ANGIO CHEST PE W OR WO CONTRAST Result Date: 05/17/2024 CLINICAL DATA:  Pulmonary embolism (PE) suspected, high prob. EXAM: CT ANGIOGRAPHY CHEST WITH CONTRAST TECHNIQUE: Multidetector CT imaging of the chest was performed using the standard protocol during bolus administration of intravenous contrast. Multiplanar CT image reconstructions and MIPs were obtained to evaluate the vascular anatomy. RADIATION DOSE REDUCTION: This exam was performed according to the departmental dose-optimization program which includes automated exposure control, adjustment of the mA and/or kV according to patient size and/or use of iterative reconstruction technique. CONTRAST:  75mL OMNIPAQUE  IOHEXOL  350 MG/ML SOLN COMPARISON:  CT scan chest from 04/21/2024. FINDINGS: Cardiovascular: There is small-to-moderate volume predominantly nonocclusive embolism involving the segmental and proximal subsegmental pulmonary artery branches throughout bilateral lungs (marked with electronic arrow sign on series 4). There is no right heart strain or lung infarction. Normal cardiac size. Physiological pericardial effusion. No aortic aneurysm. There are coronary artery calcifications, in keeping with coronary artery disease. There are also mild peripheral atherosclerotic vascular calcifications of thoracic aorta and its major branches. Mediastinum/Nodes: Visualized thyroid gland appears grossly unremarkable. 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 The mass also abuts ascending aorta, aortic arch and descending thoracic aorta. The fat planes between the mass  and the thoracic esophagus are also lost. The esophagus is nondistended precluding optimal assessment. There is mild circumferential thickening of the mid/lower thoracic esophagus, which is most likely seen in the settings of chronic gastroesophageal reflux disease versus esophagitis. No axillary lymphadenopathy by size criteria. Lungs/Pleura: The trachea and left bronchial tree are patent. Redemonstration of an approximately 8 x 17 mm soft tissue nodular thickening along the left wall of the bronchus intermedius, which is similar to the prior study. There are extensive frothy soft tissue in the bronchus intermedius and right lower lobe bronchial tree, favoring mucus/secretions versus aspiration. There are mild-to-moderate upper lobe predominant centrilobular emphysematous changes. There are patchy areas of linear, plate-like atelectasis and/or scarring throughout bilateral lungs. Redemonstration of several in regular solid noncalcified nodules in bilateral lungs, described as follows: *Linear configuration 4 x 19 mm nodule in the left lung upper lobe (series 12, image 34), unchanged since the prior study. *There is a slightly spiculated 7 x 12 mm nodule in the right lung lower lobe (series 12, image 91), unchanged since the prior study. *There are additional stable subcentimeter sized nodularity in the right upper lobe (series 12, images 62 and 78), unchanged since the prior study. No new or suspicious lung nodule. No new mass or consolidation. No pleural effusion or pneumothorax. Upper Abdomen: There is infiltrating soft tissue surrounding aorta in the visualized portion of upper abdomen which is also grossly similar to the prior study. Remaining visualized upper abdominal viscera within normal limits. Musculoskeletal: The visualized soft tissues of the chest wall are grossly  unremarkable. No suspicious osseous lesions. There are mild multilevel degenerative changes in the visualized spine. L1 superior endplate  Schmorl's node again seen. Review of the MIP images confirms the above findings. IMPRESSION: 1. There is small-to-moderate volume predominantly nonocclusive embolism involving the segmental and proximal subsegmental pulmonary artery branches throughout bilateral lungs. No right heart strain or lung infarction. 2. Redemonstration of extensive mediastinal soft tissue as well as soft tissue surrounding aorta in the visualized portion of upper abdomen which are grossly similar to the prior study. Findings favor lymphoma versus other metastatic disease. 3. There are extensive frothy soft tissue in the bronchus intermedius and right lower lobe bronchial tree, favoring mucus/secretions versus aspiration. Stable soft tissue nodular thickening along the left wall of the bronchus intermedius. 4. Stable pre-existing lung nodules, as described above. 5. Multiple other nonacute observations, as described above. Aortic Atherosclerosis (ICD10-I70.0) and Emphysema (ICD10-J43.9). Critical Value/emergent results were called by telephone at the time of interpretation on 05/17/2024 at 4:34 pm to provider Dr. Scarlette Currier, who verbally acknowledged these results. Electronically Signed   By: Beula Brunswick M.D.   On: 05/17/2024 16:36   CT Super D Chest Wo Contrast Result Date: 05/04/2024 CLINICAL DATA:  Mediastinal adenopathy.  Pulmonary nodules EXAM: CT CHEST WITHOUT CONTRAST TECHNIQUE: Multidetector CT imaging of the chest was performed using thin slice collimation for electromagnetic bronchoscopy planning purposes, without intravenous contrast. RADIATION DOSE REDUCTION: This exam was performed according to the departmental dose-optimization program which includes automated exposure control, adjustment of the mA and/or kV according to patient size and/or use of iterative reconstruction technique. COMPARISON:  CT chest 03/24/2024 FINDINGS: Cardiovascular: No acute vascular findings on noncontrast CT. Stent in the innominate artery.  Mediastinum/Nodes: Bulky subcarinal and RIGHT paratracheal adenopathy again demonstrated. No similar size to comparison CT Lungs/Pleura: Centrilobular emphysema the upper lobes. Peripheral nodule in the RIGHT upper lobe measuring 6 mm on image 58/4 unchanged. RIGHT lower lobe nodule measuring 13 mm peribronchial thickening on image 89/4 also unchanged. Upper Abdomen: Limited view of the liver, kidneys, pancreas are unremarkable. Normal adrenal glands. Musculoskeletal: No aggressive osseous lesion. IMPRESSION: 1. Stable mediastinal adenopathy. 2. Stable RIGHT upper lobe and RIGHT lower lobe pulmonary nodules. 3.  Emphysema (ICD10-J43.9). Electronically Signed   By: Deboraha Fallow M.D.   On: 05/04/2024 11:46   DG Chest Port 1 View Result Date: 04/26/2024 CLINICAL DATA:  Status post bronchoscopy and biopsy. EXAM: PORTABLE CHEST 1 VIEW COMPARISON:  Chest radiograph dated 12/14/2021. FINDINGS: No pneumothorax. Background of emphysema and interstitial coarsening. No consolidative changes or pleural effusion. The cardiac silhouette is within limits the. No acute osseous pathology. IMPRESSION: 1. No pneumothorax. 2. Emphysema. Electronically Signed   By: Angus Bark M.D.   On: 04/26/2024 15:14   DG C-ARM BRONCHOSCOPY Result Date: 04/26/2024 C-ARM BRONCHOSCOPY: Fluoroscopy was utilized by the requesting physician.  No radiographic interpretation.   VAS US  UPPER EXTREMITY ARTERIAL DUPLEX Result Date: 04/19/2024  UPPER EXTREMITY DUPLEX STUDY Patient Name:  Kristina Howe  Date of Exam:   04/19/2024 Medical Rec #: [id number]       Accession #:    [id number] Date of Birth: 1961/07/21       Patient Gender: F Patient Age:   63 years Exam Location:  [address] Procedure:      VAS US  UPPER EXTREMITY ARTERIAL DUPLEX Referring Phys: Delaney Fearing --------------------------------------------------------------------------------  Indications: Right Upper Digit discoloration. History:     Patient has a history of upper  extremity pain and catheterization  via right radial artery.  Other Factors: PROCEDURE:                1. Ultrasound-guided access of right radial artery                2. Ultrasound-guided access of right common femoral artery                3. Aortogram and right upper extremity angiogram                4. Balloon angioplasty and stenting of the right subclavian                artery                5. Balloon angioplasty and stenting of the right carotid artery                6. Exposure and direct repair of the right common carotid artery                7. Pro-glide closure of right common femoral artery                8. TR band application to the right radial artery Performing Technologist: Jenifer Miu RVT  Examination Guidelines: A complete evaluation includes B-mode imaging, spectral Doppler, color Doppler, and power Doppler as needed of all accessible portions of each vessel. Bilateral testing is considered an integral part of a complete examination. Limited examinations for reoccurring indications may be performed as noted.  Right Doppler Findings: +---------------+----------+----------+----------------------------------------+ Site           PSV (cm/s)Waveform  Comments                                 +---------------+----------+----------+----------------------------------------+ Subclavian Prox124       triphasic                                          +---------------+----------+----------+----------------------------------------+ Subclavian Mid 60        triphasic                                          +---------------+----------+----------+----------------------------------------+ Subclavian Dist50        retrograde                                         +---------------+----------+----------+----------------------------------------+ Axillary       73        triphasic                                           +---------------+----------+----------+----------------------------------------+ Brachial Prox  68        triphasic                                          +---------------+----------+----------+----------------------------------------+ Brachial Mid   97        triphasic                                          +---------------+----------+----------+----------------------------------------+  Brachial Dist  80        triphasic                                          +---------------+----------+----------+----------------------------------------+ Radial Prox    18        biphasic  High Radial Bifucation (Distal upper                                        arm)                                     +---------------+----------+----------+----------------------------------------+ Radial Mid     9         biphasic                                           +---------------+----------+----------+----------------------------------------+ Radial Dist    6         retrogradeRetrograde at wrist; returns antegrade                                      with ulnar artery compression            +---------------+----------+----------+----------------------------------------+ Ulnar Prox     44        biphasic                                           +---------------+----------+----------+----------------------------------------+ Ulnar Mid      52        biphasic                                           +---------------+----------+----------+----------------------------------------+ Ulnar Dist     63        biphasic                                           +---------------+----------+----------+----------------------------------------+ Incidental Findings: Right Basilic Vein Superficial Thrombosis - Upper arm to Forearm  Summary:  Right: - Patent upper extremity arteries where visualized.        - High radial artery bifucation with small caliber vessel        size throughout  (0.10 cm) with no significant obstruction        identified; however, retrograde flow is observed at the wrist        and returns antegrade with ulnar artery compression        suggestive of a steal component.        - Low velocities throughout the right radial artery.         Incidental Findings: Superficial vein thrombosis of the right        basilic vein upper arm and forearm segments. *See  table(s) above for measurements and observations. Electronically signed by Genny Kid MD on 04/19/2024 at 11:47:52 AM.    Final    (Echo, Carotid, EGD, Colonoscopy, ERCP)    Subjective: Patient seen in the morning rounds.  Pain is controlled.  Denies any complaints.  Comfortable using Lovenox  injections by herself.   Discharge Exam: Vitals:   05/17/24 2320 05/18/24 0320  BP: (!) 149/78 (!) 146/90  Pulse: 85 86  Resp:    Temp: 98.3 F (36.8 C) 98.1 F (36.7 C)  SpO2: 98% (!) 89%   Vitals:   05/17/24 1500 05/17/24 1919 05/17/24 2320 05/18/24 0320  BP: (!) 142/84 (!) 146/81 (!) 149/78 (!) 146/90  Pulse: 78 91 85 86  Resp: 19 18    Temp:  98.5 F (36.9 C) 98.3 F (36.8 C) 98.1 F (36.7 C)  TempSrc:  Oral Oral Oral  SpO2: 98% 93% 98% (!) 89%  Weight:      Height:        General: Pt is alert, awake, not in acute distress Cardiovascular: RRR, S1/S2 +, no rubs, no gallops Respiratory: CTA bilaterally, no wheezing, no rhonchi, occasional conducted upper airway sounds. Abdominal: Soft, NT, ND, bowel sounds + Extremities: no edema, no cyanosis    The results of significant diagnostics from this hospitalization (including imaging, microbiology, ancillary and laboratory) are listed below for reference.     Microbiology: No results found for this or any previous visit (from the past 240 hours).   Labs: BNP (last 3 results) Recent Labs    05/17/24 1415  BNP 42.3   Basic Metabolic Panel: Recent Labs  Lab 05/11/24 1342 05/17/24 1415 05/18/24 0547  NA 139 137  --   K 4.1 3.8  --    CL 101 101  --   CO2 25 22  --   GLUCOSE 104* 96  --   BUN 17 18  --   CREATININE 0.82 0.72 0.74  CALCIUM  10.9* 10.8*  --    Liver Function Tests: Recent Labs  Lab 05/11/24 1342  AST 17  ALT 8  ALKPHOS 96  BILITOT 0.6  PROT 8.5*  ALBUMIN  4.5   No results for input(s): "LIPASE", "AMYLASE" in the last 168 hours. No results for input(s): "AMMONIA" in the last 168 hours. CBC: Recent Labs  Lab 05/11/24 1342 05/17/24 1415 05/18/24 0547  WBC 7.7 5.9 6.8  NEUTROABS 6.1  --   --   HGB 10.8* 10.6* 9.7*  HCT 33.5* 34.6* 31.0*  MCV 82.1 85.0 85.2  PLT 378 404* 343   Cardiac Enzymes: No results for input(s): "CKTOTAL", "CKMB", "CKMBINDEX", "TROPONINI" in the last 168 hours. BNP: Invalid input(s): "POCBNP" CBG: No results for input(s): "GLUCAP" in the last 168 hours. D-Dimer No results for input(s): "DDIMER" in the last 72 hours. Hgb A1c No results for input(s): "HGBA1C" in the last 72 hours. Lipid Profile No results for input(s): "CHOL", "HDL", "LDLCALC", "TRIG", "CHOLHDL", "LDLDIRECT" in the last 72 hours. Thyroid function studies No results for input(s): "TSH", "T4TOTAL", "T3FREE", "THYROIDAB" in the last 72 hours.  Invalid input(s): "FREET3" Anemia work up No results for input(s): "VITAMINB12", "FOLATE", "FERRITIN", "TIBC", "IRON", "RETICCTPCT" in the last 72 hours. Urinalysis    Component Value Date/Time   COLORURINE YELLOW 04/12/2022 1117   APPEARANCEUR CLEAR 04/12/2022 1117   LABSPEC 1.011 04/12/2022 1117   PHURINE 7.0 04/12/2022 1117   GLUCOSEU NEGATIVE 04/12/2022 1117   HGBUR NEGATIVE 04/12/2022 1117   BILIRUBINUR NEGATIVE 04/12/2022 1117   KETONESUR  NEGATIVE 04/12/2022 1117   PROTEINUR NEGATIVE 04/12/2022 1117   NITRITE NEGATIVE 04/12/2022 1117   LEUKOCYTESUR NEGATIVE 04/12/2022 1117   Sepsis Labs Recent Labs  Lab 05/11/24 1342 05/17/24 1415 05/18/24 0547  WBC 7.7 5.9 6.8   Microbiology No results found for this or any previous visit (from the  past 240 hours).   Time coordinating discharge: 35 minutes  SIGNED:   Vada Garibaldi, MD  [facility] 05/18/2024, 10:43 AM

## 2024-05-18 NOTE — TOC Progression Note (Signed)
 Transition of Care [facility]) - Progression Note    Patient Details  Name: Kristina Howe MRN: [id number] Date of Birth: 1961/04/21  Transition of Care [facility]) CM/SW Contact  Loreda Rodriguez, RN Phone Number:[phone]  05/18/2024, 11:41 AM  Clinical Narrative:    According to ambulatory sat results patient will meet criteria for home O2. Nurse and MD have been updated. No other TOC needs noted.         Expected Discharge Plan and Services         Expected Discharge Date: 05/18/24                                     Social Determinants of Health (SDOH) Interventions SDOH Screenings   Food Insecurity: No Food Insecurity (05/11/2024)  Housing: Low Risk  (05/11/2024)  Transportation Needs: No Transportation Needs (05/11/2024)  Utilities: Not At Risk (05/11/2024)  Depression (PHQ2-9): Low Risk  (05/11/2024)  Tobacco Use: Medium Risk (05/17/2024)    Readmission Risk Interventions     No data to display

## 2024-05-18 NOTE — Plan of Care (Signed)

## 2024-05-18 NOTE — Plan of Care (Signed)

## 2024-05-18 NOTE — Progress Notes (Signed)
 Thoracic Location of Tumor / Histology: Right lower lobe Lung, Right upper lobe lung, Left Upper Lobe Lung, enlarged lymph nodes in the mediastinum and subcarinal area.  Patient presented to the ER for suspected infection in her right fingers.  During this workup CT imaging was obtained and showed multiple nodules and lymphadenopathy, including nodules in the right lower lobe lung, and left upper lobe as well as lymph node enlargement in the mediastinum and subcarinal area.   PET 05/31/2024:  MRI Brain 05/17/2024: 1.2 cm rim enhancing lesion positioned at the left temporoccipital junction, suspicious for a metastatic lesion given provided history.  Question additional 5 mm focus of enhancement at the superior right cerebellum, nonspecific, and could reflect an additional metastasis or possibly a small vascular lesion.    Biopsies of Right/Left Lung, Lymph Nodes 04/26/2024      Past/Anticipated interventions by cardiothoracic surgery, if any:   Past/Anticipated interventions by medical oncology, if any:  Dr. Marguerita Shih 05/11/2024 -Surgery is not an option due to tumor spread.  -Treatment options include chemoradiation if stage III, or more chemotherapy and less radiation if stage IV.  - Order PET scan and MRI brain on June 9 to determine cancer staging.  - Plan for chemoradiation if stage III, with chemotherapy once a week and radiation for six and a half weeks. - Consider immunotherapy after chemoradiation if stage III. - If stage IV, plan for more chemotherapy and less radiation.   Tobacco/Marijuana/Snuff/ETOH use: Former Smoke, quit in April 2025.  Signs/Symptoms Weight changes, if any: She reports about 18 pound weight loss over the past 8 months. Respiratory complaints, if any: She reports SOB when she is up moving around. Hemoptysis, if any: She reports productive cough with clear phlegm.  Denies hemoptysis. Pain issues, if any:  She reports some chest pain, 5-6/10.   SAFETY  ISSUES: Prior radiation? No Pacemaker/ICD? No  Possible current pregnancy? Hysterectomy Is the patient on methotrexate? No  Current Complaints / other details:   Right Subclavian Stent

## 2024-05-19 ENCOUNTER — Telehealth: Payer: Self-pay | Admitting: Internal Medicine

## 2024-05-19 DIAGNOSIS — R0902 Hypoxemia: Secondary | ICD-10-CM | POA: Diagnosis not present

## 2024-05-19 DIAGNOSIS — D649 Anemia, unspecified: Secondary | ICD-10-CM | POA: Diagnosis not present

## 2024-05-19 DIAGNOSIS — R0602 Shortness of breath: Secondary | ICD-10-CM | POA: Diagnosis not present

## 2024-05-19 NOTE — Telephone Encounter (Signed)
 Moved appointments to after her PET scan. Left a voicemail with rescheduled appointment details.

## 2024-05-20 ENCOUNTER — Telehealth: Payer: Self-pay | Admitting: Radiation Therapy

## 2024-05-20 ENCOUNTER — Encounter (HOSPITAL_COMMUNITY): Payer: Self-pay | Admitting: Emergency Medicine

## 2024-05-20 ENCOUNTER — Ambulatory Visit
Admission: RE | Admit: 2024-05-20 | Discharge: 2024-05-20 | Disposition: A | Discharge status: Home / Self Care | Source: Ambulatory Visit | Attending: Radiation Oncology | Admitting: Radiation Oncology

## 2024-05-20 ENCOUNTER — Other Ambulatory Visit: Payer: Self-pay | Admitting: Radiation Therapy

## 2024-05-20 ENCOUNTER — Emergency Department (HOSPITAL_COMMUNITY)

## 2024-05-20 ENCOUNTER — Other Ambulatory Visit: Payer: Self-pay

## 2024-05-20 ENCOUNTER — Emergency Department (HOSPITAL_COMMUNITY)
Admission: EM | Admit: 2024-05-20 | Discharge: 2024-05-20 | Disposition: A | Discharge status: Home / Self Care | Attending: Emergency Medicine | Admitting: Emergency Medicine

## 2024-05-20 ENCOUNTER — Telehealth: Payer: Self-pay | Admitting: Emergency Medicine

## 2024-05-20 ENCOUNTER — Encounter: Payer: Self-pay | Admitting: Radiation Oncology

## 2024-05-20 ENCOUNTER — Ambulatory Visit: Admitting: Internal Medicine

## 2024-05-20 VITALS — BP 138/93 | HR 91 | Temp 97.3°F | Resp 18 | Ht 67.0 in | Wt 102.4 lb

## 2024-05-20 DIAGNOSIS — F410 Panic disorder [episodic paroxysmal anxiety] without agoraphobia: Secondary | ICD-10-CM | POA: Insufficient documentation

## 2024-05-20 DIAGNOSIS — I3139 Other pericardial effusion (noninflammatory): Secondary | ICD-10-CM | POA: Diagnosis not present

## 2024-05-20 DIAGNOSIS — I10 Essential (primary) hypertension: Secondary | ICD-10-CM | POA: Insufficient documentation

## 2024-05-20 DIAGNOSIS — R739 Hyperglycemia, unspecified: Secondary | ICD-10-CM | POA: Insufficient documentation

## 2024-05-20 DIAGNOSIS — Z87891 Personal history of nicotine dependence: Secondary | ICD-10-CM | POA: Insufficient documentation

## 2024-05-20 DIAGNOSIS — E875 Hyperkalemia: Secondary | ICD-10-CM | POA: Diagnosis not present

## 2024-05-20 DIAGNOSIS — I2699 Other pulmonary embolism without acute cor pulmonale: Secondary | ICD-10-CM | POA: Insufficient documentation

## 2024-05-20 DIAGNOSIS — C7931 Secondary malignant neoplasm of brain: Secondary | ICD-10-CM

## 2024-05-20 DIAGNOSIS — D649 Anemia, unspecified: Secondary | ICD-10-CM | POA: Insufficient documentation

## 2024-05-20 DIAGNOSIS — I700 Atherosclerosis of aorta: Secondary | ICD-10-CM | POA: Diagnosis not present

## 2024-05-20 DIAGNOSIS — Z743 Need for continuous supervision: Secondary | ICD-10-CM | POA: Diagnosis not present

## 2024-05-20 DIAGNOSIS — R634 Abnormal weight loss: Secondary | ICD-10-CM | POA: Diagnosis not present

## 2024-05-20 DIAGNOSIS — R0602 Shortness of breath: Secondary | ICD-10-CM | POA: Diagnosis not present

## 2024-05-20 DIAGNOSIS — I444 Left anterior fascicular block: Secondary | ICD-10-CM | POA: Diagnosis not present

## 2024-05-20 DIAGNOSIS — E871 Hypo-osmolality and hyponatremia: Secondary | ICD-10-CM | POA: Diagnosis not present

## 2024-05-20 DIAGNOSIS — I2510 Atherosclerotic heart disease of native coronary artery without angina pectoris: Secondary | ICD-10-CM | POA: Diagnosis not present

## 2024-05-20 DIAGNOSIS — Z79899 Other long term (current) drug therapy: Secondary | ICD-10-CM | POA: Insufficient documentation

## 2024-05-20 DIAGNOSIS — C3431 Malignant neoplasm of lower lobe, right bronchus or lung: Secondary | ICD-10-CM

## 2024-05-20 DIAGNOSIS — R109 Unspecified abdominal pain: Secondary | ICD-10-CM | POA: Diagnosis not present

## 2024-05-20 DIAGNOSIS — J432 Centrilobular emphysema: Secondary | ICD-10-CM | POA: Diagnosis not present

## 2024-05-20 DIAGNOSIS — E7800 Pure hypercholesterolemia, unspecified: Secondary | ICD-10-CM | POA: Diagnosis not present

## 2024-05-20 DIAGNOSIS — R069 Unspecified abnormalities of breathing: Secondary | ICD-10-CM | POA: Diagnosis not present

## 2024-05-20 DIAGNOSIS — J439 Emphysema, unspecified: Secondary | ICD-10-CM | POA: Diagnosis not present

## 2024-05-20 DIAGNOSIS — Z85118 Personal history of other malignant neoplasm of bronchus and lung: Secondary | ICD-10-CM | POA: Insufficient documentation

## 2024-05-20 DIAGNOSIS — J441 Chronic obstructive pulmonary disease with (acute) exacerbation: Secondary | ICD-10-CM | POA: Diagnosis not present

## 2024-05-20 DIAGNOSIS — R0689 Other abnormalities of breathing: Secondary | ICD-10-CM | POA: Diagnosis not present

## 2024-05-20 DIAGNOSIS — R4702 Dysphasia: Secondary | ICD-10-CM | POA: Diagnosis not present

## 2024-05-20 DIAGNOSIS — R0902 Hypoxemia: Secondary | ICD-10-CM | POA: Diagnosis not present

## 2024-05-20 LAB — I-STAT VENOUS BLOOD GAS, ED
Acid-base deficit: 1 mmol/L (ref 0.0–2.0)
Bicarbonate: 22.5 mmol/L (ref 20.0–28.0)
Calcium, Ion: 1.28 mmol/L (ref 1.15–1.40)
HCT: 31 % — ABNORMAL LOW (ref 36.0–46.0)
Hemoglobin: 10.5 g/dL — ABNORMAL LOW (ref 12.0–15.0)
O2 Saturation: 71 %
Potassium: 3.2 mmol/L — ABNORMAL LOW (ref 3.5–5.1)
Sodium: 132 mmol/L — ABNORMAL LOW (ref 135–145)
TCO2: 24 mmol/L (ref 22–32)
pCO2, Ven: 32.4 mmHg — ABNORMAL LOW (ref 44–60)
pH, Ven: 7.45 — ABNORMAL HIGH (ref 7.25–7.43)
pO2, Ven: 35 mmHg (ref 32–45)

## 2024-05-20 LAB — CBC WITH DIFFERENTIAL/PLATELET
Abs Immature Granulocytes: 0.02 10*3/uL (ref 0.00–0.07)
Basophils Absolute: 0 10*3/uL (ref 0.0–0.1)
Basophils Relative: 0 %
Eosinophils Absolute: 0 10*3/uL (ref 0.0–0.5)
Eosinophils Relative: 0 %
HCT: 31.2 % — ABNORMAL LOW (ref 36.0–46.0)
Hemoglobin: 9.9 g/dL — ABNORMAL LOW (ref 12.0–15.0)
Immature Granulocytes: 0 %
Lymphocytes Relative: 13 %
Lymphs Abs: 0.6 10*3/uL — ABNORMAL LOW (ref 0.7–4.0)
MCH: 26.3 pg (ref 26.0–34.0)
MCHC: 31.7 g/dL (ref 30.0–36.0)
MCV: 82.8 fL (ref 80.0–100.0)
Monocytes Absolute: 0.3 10*3/uL (ref 0.1–1.0)
Monocytes Relative: 6 %
Neutro Abs: 3.8 10*3/uL (ref 1.7–7.7)
Neutrophils Relative %: 81 %
Platelets: 373 10*3/uL (ref 150–400)
RBC: 3.77 MIL/uL — ABNORMAL LOW (ref 3.87–5.11)
RDW: 15.1 % (ref 11.5–15.5)
WBC: 4.8 10*3/uL (ref 4.0–10.5)
nRBC: 0 % (ref 0.0–0.2)

## 2024-05-20 LAB — RESP PANEL BY RT-PCR (RSV, FLU A&B, COVID)  RVPGX2
Influenza A by PCR: NEGATIVE
Influenza B by PCR: NEGATIVE
Resp Syncytial Virus by PCR: NEGATIVE
SARS Coronavirus 2 by RT PCR: NEGATIVE

## 2024-05-20 LAB — COMPREHENSIVE METABOLIC PANEL WITH GFR
ALT: 10 U/L (ref 0–44)
AST: 23 U/L (ref 15–41)
Albumin: 3.6 g/dL (ref 3.5–5.0)
Alkaline Phosphatase: 78 U/L (ref 38–126)
Anion gap: 15 (ref 5–15)
BUN: 12 mg/dL (ref 8–23)
CO2: 18 mmol/L — ABNORMAL LOW (ref 22–32)
Calcium: 10.3 mg/dL (ref 8.9–10.3)
Chloride: 98 mmol/L (ref 98–111)
Creatinine, Ser: 0.78 mg/dL (ref 0.44–1.00)
GFR, Estimated: >60 mL/min (ref >60)
Glucose, Bld: 172 mg/dL — ABNORMAL HIGH (ref 70–99)
Potassium: 3.3 mmol/L — ABNORMAL LOW (ref 3.5–5.1)
Sodium: 131 mmol/L — ABNORMAL LOW (ref 135–145)
Total Bilirubin: 0.9 mg/dL (ref 0.0–1.2)
Total Protein: 7.8 g/dL (ref 6.5–8.1)

## 2024-05-20 LAB — BRAIN NATRIURETIC PEPTIDE: B Natriuretic Peptide: 33.8 pg/mL (ref 0.0–100.0)

## 2024-05-20 MED ORDER — METHYLPREDNISOLONE 4 MG PO TBPK
ORAL_TABLET | ORAL | 0 refills | Status: DC
Start: 2024-05-20 — End: 2024-06-04

## 2024-05-20 MED ORDER — ALPRAZOLAM 0.25 MG PO TABS
0.5000 mg | ORAL_TABLET | Freq: Once | ORAL | Status: AC
  Administered 2024-05-20: 0.5 mg via ORAL
  Filled 2024-05-20: qty 2

## 2024-05-20 NOTE — ED Notes (Signed)
 Pt ambulated to the restroom.

## 2024-05-20 NOTE — ED Provider Notes (Signed)
 [place] EMERGENCY DEPARTMENT AT [facility] Provider Note   CSN: [id number] Arrival date & time: 05/20/24  1601     Patient presents with: Shortness of Breath   Kristina Howe is a 63 y.o. female newly diagnosed with stage III lung cancer.  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  [facility] report at bedside.  Patient apparently has had progressively worsening wheezing and shortness of breath over the past several days.  EMS reports that they found her in respiratory distress with diffuse wheezing.  She was placed on oxygen and given 15 mg of albuterol , 125 of Solu-Medrol , 2 mg of magnesium  prior to arrival with improvement in her breathing status.  Patient states that she also gets extremely anxious and feels like she had an anxiety attack today.  She denies any chest pain or fevers.    Shortness of Breath      Prior to Admission medications   Medication Sig Start Date End Date Taking? Authorizing Provider  albuterol  (VENTOLIN  HFA) 108 (90 Base) MCG/ACT inhaler Inhale 2 puffs into the lungs every 6 (six) hours as needed for wheezing or shortness of breath. 05/18/24   Vada Garibaldi, MD  ALPRAZolam  (XANAX ) 0.5 MG tablet Take 0.5 mg by mouth 3 (three) times daily as needed for sleep. 05/04/24 06/03/24  [provider]  atorvastatin  (LIPITOR) 40 MG tablet Take 1 tablet (40 mg total) by mouth daily. 03/12/24   Haydee Lipa, MD  enoxaparin  (LOVENOX ) 60 MG/0.6ML injection Inject 0.5 mLs (50 mg total) into the skin every 12 (twelve) hours. 05/18/24   Vada Garibaldi, MD  FLUoxetine  (PROZAC ) 20 MG capsule Take 20 mg by mouth daily.    [provider]  magnesium  30 MG tablet Take 30 mg by mouth 2 (two) times daily.    [provider]  Multiple Vitamins-Minerals (MULTIVITAMIN WITH  MINERALS) tablet Take 1 tablet by mouth daily.    [provider]  oxyCODONE  (OXY IR/ROXICODONE ) 5 MG immediate release tablet Take 1 tablet (5 mg total) by mouth every 6 (six) hours as needed for severe pain (pain score 7-10). 05/18/24   Ghimire, Kuber, MD  PERMETHRIN EX PORTABLE OXYGEN TANK for Dx: HYPOXIA 05/19/24   [provider]  triamterene -hydrochlorothiazide  (MAXZIDE -25) 37.5-25 MG per tablet Take 1 tablet by mouth daily.      [provider]  valsartan (DIOVAN) 320 MG tablet Take 320 mg by mouth daily. 12/31/23   [provider]    Allergies: Patient has no known allergies.    Review of Systems  Respiratory:  Positive for shortness of breath.     Updated Vital Signs BP (!) 145/79   Pulse (!) 103   Temp (!) 97.4 F (36.3 C)   Resp 20   Ht 5' 7 (1.702 m)   Wt 45.8 kg   SpO2 99% Comment: on neb  BMI 15.82 kg/m   Physical Exam Vitals and nursing note reviewed.  Constitutional:      General: She is not in acute distress.    Appearance: She is well-developed and underweight. She is not diaphoretic.  HENT:     Head: Normocephalic and atraumatic.     Right Ear: External ear normal.     Left Ear: External ear normal.     Nose: Nose normal.  Mouth/Throat:     Mouth: Mucous membranes are moist.   Eyes:     General: No scleral icterus.    Conjunctiva/sclera: Conjunctivae normal.    Cardiovascular:     Rate and Rhythm: Normal rate and regular rhythm.     Heart sounds: Normal heart sounds. No murmur heard.    No friction rub. No gallop.  Pulmonary:     Effort: Pulmonary effort is normal. No respiratory distress.     Breath sounds: Wheezing and rhonchi present.  Abdominal:     General: Bowel sounds are normal. There is no distension.     Palpations: Abdomen is soft. There is no mass.     Tenderness: There is no abdominal tenderness. There is no guarding.   Musculoskeletal:     Cervical back: Normal range of motion.     Right  lower leg: No edema.     Left lower leg: No edema.   Skin:    General: Skin is warm and dry.   Neurological:     Mental Status: She is alert and oriented to person, place, and time.   Psychiatric:        Behavior: Behavior normal.     (all labs ordered are listed, but only abnormal results are displayed) Labs Reviewed  RESP PANEL BY RT-PCR (RSV, FLU A&B, COVID)  [id number]  CBC WITH DIFFERENTIAL/PLATELET  BRAIN NATRIURETIC PEPTIDE  I-STAT VENOUS BLOOD GAS, ED    EKG: None  Radiology: No results found.   Procedures   Medications Ordered in the ED - No data to display  Clinical Course as of 05/20/24 2252  Thu May 20, 2024  1816 pH, Ven(!): 7.450 [AH]  1817 I-Stat venous blood gas, ([facility] ED, [facility], [facility])(!) VBG appears to show a respiratory acidosis. [AH]  1817 Resp panel by RT-PCR (RSV, Flu A&B, Covid) Anterior Nasal Swab Respiratory panel negative [AH]  1817 Comprehensive metabolic panel(!) CMP with mild hyponatremia hypokalemia and elevated blood glucose in the setting of recent albuterol  treatment and IV steroids [AH]  1818 Hemoglobin(!): 9.9 Hemoglobin at baseline [AH]  1818 Brain natriuretic peptide BNP within normal limits [AH]  1818 DG Chest Port 1 [employer] and interpreted a 1 view chest x-ray which shows paratracheal mass which is known and emphysema without other acute finding. [AH]  1932 Reevaluated the patient at bedside.  Her lungs are clear and she feels that she is breathing much better.  Patient is tearful although and states that she still feels anxious.  Heart rate is somewhat elevated after her albuterol  treatment.  When patient calms down her heart rate drops.  Will give her her normal dose of Xanax  now.  Suspect she can be discharged. [AH]    Clinical Course User Index [AH] Tama Fails, PA-C                                 Medical Decision Making Amount and/or Complexity of Data Reviewed Labs: ordered. Decision-making details documented in  ED Course. Radiology: ordered. Decision-making details documented in ED Course.  Risk Prescription drug management.    This patient presents to the ED for concern of wheezing and sob, this involves an extensive number of treatment options, and is a complaint that carries with it a high risk of complications and morbidity.  The emergent differential diagnosis for shortness of breath includes, but is not limited to, Pulmonary edema, bronchoconstriction, Pneumonia, Pulmonary embolism, Pneumotherax/ Hemothorax,  Dysrythmia, ACS.    Co morbidities:   has a past medical history of Anemia (04/02/2024), Anxiety, Cancer (HCC), Elevated cholesterol, Hypertension, and Subclavian artery stenosis, right (HCC) (04/19/2024).   Social Determinants of Health:   SDOH Screenings   Food Insecurity: No Food Insecurity (05/11/2024)  Housing: Low Risk  (05/11/2024)  Transportation Needs: No Transportation Needs (05/11/2024)  Utilities: Not At Risk (05/11/2024)  Depression (PHQ2-9): Low Risk  (05/11/2024)  Tobacco Use: Medium Risk (05/20/2024)     Additional history:  {Additional history obtained from EMS/ husband at bedside  Records reviewed including previous college notes.  Lab Tests:  I Ordered, and personally interpreted labs.  The pertinent results include:    As per ED course  Imaging Studies: As per ED course I agree with the radiologist interpretation  Cardiac Monitoring/ECG:  The patient was maintained on a cardiac monitor.  I personally viewed and interpreted the cardiac monitored which showed an underlying rhythm of: Sinus tachycardia  Medicines ordered and prescription drug management:  I ordered medication including  Medications  ALPRAZolam  (XANAX ) tablet 0.5 mg (0.5 mg Oral Given 05/20/24 1940)   for anxiety Reevaluation of the patient after these medicines showed that the patient improved I have reviewed the patients home medicines and have made adjustments as needed  Test  Considered:   considered CT angiogram however patient is currently anticoagulated for known arterial embolus.  She has no acute change in her breathing status.  Her breathing has resolved  Critical Interventions:    Consultations Obtained:   Problem List / ED Course:     ICD-10-CM   1. COPD exacerbation (HCC)  J44.1     2. Panic  F41.0       MDM:  Patient here with COPD the exacerbation complicated by acute panic attack.  She does not appear to have any other cause of her shortness of breath and had wheezing and rhonchi that cleared after albuterol  Solu-Medrol  magnesium .  Patient continued to have some anxiety and takes Xanax  regularly and was given her regular home dose orally with improvement.  I do not think that her tachycardia represents a new pulmonary embolus.  Patient is not having hemoptysis chest pain or exertional dyspnea.  Her breathing issues have resolved with treatment.  Her clinical picture is out of COPD exacerbation.  She appears appropriate for discharge at this time with strict return precautions       Final diagnoses:  COPD exacerbation [facility])  Panic    ED Discharge Orders     None          Tama Fails, PA-C 05/20/24 2320    Lowery Rue, DO 05/21/24 1503

## 2024-05-20 NOTE — ED Notes (Signed)
 Xray at [facility]

## 2024-05-20 NOTE — ED Notes (Signed)
Pt discharged. Pt given discharge papers and papers explained. Pt in NAD at this time

## 2024-05-20 NOTE — ED Notes (Signed)
 Pt ambulated to the bathroom. Pt's gait is even and steady and is able to walk independently.

## 2024-05-20 NOTE — Progress Notes (Signed)
 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 A diagnosis of Secondary malignant neoplasm of brain [facility]) was also pertinent to this visit.   HISTORY OF PRESENT ILLNESS: Kristina Howe is a 63 y.o. female seen at the request of Dr. Marguerita Shih for a newly diagnosed lung cancer. The patient presented after having unintentional weight loss and abdominal pain and was seen by GI.  Her workup included CT abdomen and pelvis on 12/25/2023 that showed no acute abnormality but some asked symmetric distal esophageal wall thickening and an indeterminate 10 mm left lower renal lesion.  She underwent an MRI of the abdomen with and without contrast that showed multiple small fluid signals of benign appearing fluid-filled cysts in the left kidney with no other of acute findings in other abdominal organs.  She presented to the emergency department with discoloration and swelling of her fingers and a CT angiography of the right upper extremity showed no vascular anomalies but multiple nodules in the right upper lobe and evidence of emphysema.  CT angiography of the chest on 03/09/2024 showed multiple pulmonary nodules that were indeterminate but concerns for adenopathy in the mediastinum and high-grade subclavian artery stenosis due to an atherosclerotic plaque and associated free-floating thrombus with this plaque.  A suspected atrial septal defect was noted and acute to subacute pulmonary emboli with small embolic burden were noted.  A CT super D scan showed bulky subcarinal, and right paratracheal adenopathy.  She had a right upper lobe nodule measuring 6 mm, a right lower lobe nodule measuring 13 mm,  and  she underwent bronchoscopy with Dr. Baldwin Levee on 04/26/2024 and the right lower lobe was the target lesion, fine-needle aspirate and brushings were negative but biopsy of the right bronchus intermedius showed a non-small cell lung cancer as did brushings of the site most consistent with squamous cell features lavage also confirmed this finding and left mainstem biopsy showed non-small cell carcinoma as well as the brushings morphologically favoring poorly differentiated adenocarcinoma.  A level 4R lymph node also showed non-small cell lung cancer.  She was going for a PET scan and MRI on 05/17/2024.  The MRI of the brain showed concerns for a rim-enhancing lesion in the left temporo-occipital junction suspicious for metastatic disease and a concern for a 5 mm focus of enhancement in the superior right cerebellum.  Her PET scan that day however was not performed due to onset of shortness of breath and she was taken to the emergency room and CT angiography again showed small to moderate volume nonocclusive embolism involving the segmental and proximal subsegmental pulmonary artery branches throughout bilateral lungs with no evidence of heart strain or infarct.  Extensive mediastinal soft tissue disease surrounding the aorta was appreciated and frothy soft tissue in the bronchus intermedius, right lower lobe bronchial tree favoring mucus or secretion and soft tissue nodular thickening along the left wall of the bronchus intermedius with stable nodules otherwise.  She has been rescheduled for pet imaging and is seen today to consider treatment of her brain disease and possibly for additional therapy to her chest.    PREVIOUS RADIATION THERAPY: No   PAST  MEDICAL HISTORY:  Past Medical History:  Diagnosis Date   Anemia 04/02/2024   hgb = 9   Anxiety    tx w/ xanax  and celexa    Cancer (HCC)    Elevated cholesterol    Hypertension    Subclavian artery stenosis, right (HCC) 04/19/2024   right forearm pain;   thrombophlebitis of her superficial basilic forearm veins- instructed her to take ASA 81 mg & warm compresses       PAST SURGICAL HISTORY: Past Surgical History:  Procedure Laterality Date   ABDOMINAL HYSTERECTOMY     AORTOGRAM N/A 03/11/2024   Procedure: AORTIC ARCH ANGIOGRAM;  Surgeon: Philipp Brawn, MD;  Location: [facility] OR;  Service: Vascular;  Laterality: N/A;   BRONCHIAL BIOPSY  04/26/2024   Procedure: BRONCHOSCOPY, WITH BIOPSY;  Surgeon: Denson Flake, MD;  Location: [facility] END[facility]COPY;  Service: Pulmonary;;   BRONCHIAL BRUSHINGS  04/26/2024   Procedure: BRONCHOSCOPY, WITH BRUSH BIOPSY;  Surgeon: Denson Flake, MD;  Location: [facility];  Service: Pulmonary;;   BRONCHIAL NEEDLE ASPIRATION BIOPSY  04/26/2024   Procedure: BRONCHOSCOPY, WITH NEEDLE ASPIRATION BIOPSY;  Surgeon: Denson Flake, MD;  Location: [facility];  Service: Pulmonary;;   BRONCHIAL WASHINGS  04/26/2024   Procedure: IRRIGATION, BRONCHUS;  Surgeon: Denson Flake, MD;  Location: [facility];  Service: Pulmonary;;   BRONCHOSCOPY, WITH BIOPSY USING ELECTROMAGNETIC NAVIGATION Right 04/26/2024   Procedure: BRONCHOSCOPY, WITH BIOPSY USING ELECTROMAGNETIC NAVIGATION;  Surgeon: Denson Flake, MD;  Location: [facility];  Service: Pulmonary;  Laterality: Right;   COLONOSCOPY  2024   INSERTION OF RETROGRADE CAROTID STENT Right 03/11/2024   Procedure: INSERTION RIGHT SUBCLAVIAN ARTERY AND RIGHT COMMON CAROTID ARTERY STENTS;  Surgeon: Philipp Brawn, MD;  Location: [facility] OR;  Service: Vascular;  Laterality: Right;   THORACIC EXPOSURE Right 03/11/2024   Procedure: DIRECT EXPOSURE OF RIGHT COMMON CAROTID ARTERY;  Surgeon: Philipp Brawn, MD;  Location: [facility] OR;  Service: Vascular;  Laterality: Right;   ULTRASOUND GUIDANCE FOR VASCULAR ACCESS Right 03/11/2024   Procedure: ULTRASOUND GUIDANCE, FOR VASCULAR ACCESS, RIGHT RADIAL ARTERY AND RIGHT FEMORAL ARTERY;  Surgeon: Philipp Brawn, MD;  Location: [facility] OR;  Service: Vascular;   Laterality: Right;   UPPER EXTREMITY ANGIOGRAM Right 03/11/2024   Procedure: [employer], RIGHT UPPER EXTREMITY;  Surgeon: Philipp Brawn, MD;  Location: [facility] OR;  Service: Vascular;  Laterality: Right;   UPPER GI ENDOSCOPY  2022   VIDEO BRONCHOSCOPY WITH ENDOBRONCHIAL ULTRASOUND Right 04/26/2024   Procedure: BRONCHOSCOPY, WITH EBUS;  Surgeon: Denson Flake, MD;  Location: [facility] ENDOSCOPY;  Service: Pulmonary;  Laterality: Right;  Possible robotic navigation as well depending on CT results     FAMILY HISTORY: History reviewed. No pertinent family history.   SOCIAL HISTORY:  reports that she quit smoking about 30 years ago. Her smoking use included cigarettes. She started smoking about 2 months ago. She has never used smokeless tobacco. She reports that she does not currently use alcohol. She reports that she does not use drugs.  The patient is in a relationship and accompanied by her partner Dee Farber.  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  She is hoping to use a cancer policy and consider being on disability.   ALLERGIES: Patient has no known allergies.   MEDICATIONS:  Current Outpatient Medications  Medication Sig Dispense Refill   albuterol  (VENTOLIN  HFA) 108 (90 Base) MCG/ACT inhaler Inhale 2 puffs into  the lungs every 6 (six) hours as needed for wheezing or shortness of breath. 6.7 g 2   ALPRAZolam  (XANAX ) 0.5 MG tablet Take 0.5 mg by mouth 3 (three) times daily as needed for sleep.     atorvastatin  (LIPITOR) 40 MG tablet Take 1 tablet (40 mg total) by mouth daily. 30 tablet 0   enoxaparin  (LOVENOX ) 60 MG/0.6ML injection Inject 0.5 mLs (50 mg total) into the skin every 12 (twelve) hours. 36 mL 0   FLUoxetine (PROZAC) 20 MG capsule Take 20 mg by mouth daily.     magnesium 30 MG tablet Take 30 mg by mouth 2 (two) times daily.     Multiple Vitamins-Minerals (MULTIVITAMIN WITH MINERALS) tablet Take 1 tablet by mouth  daily.     oxyCODONE  (OXY IR/ROXICODONE ) 5 MG immediate release tablet Take 1 tablet (5 mg total) by mouth every 6 (six) hours as needed for severe pain (pain score 7-10). 30 tablet 0   PERMETHRIN EX PORTABLE OXYGEN TANK for Dx: HYPOXIA     triamterene-hydrochlorothiazide (MAXZIDE-25) 37.5-25 MG per tablet Take 1 tablet by mouth daily.       valsartan (DIOVAN) 320 MG tablet Take 320 mg by mouth daily.     No current facility-administered medications for this encounter.     REVIEW OF SYSTEMS: On review of systems, the patient reports that she is doing okay.  The information she has been receiving about her health is quite overwhelming, but she is ready to be aggressive about treating the problem.  She is working with her primary care provider to coordinate and home oxygen as she gets short of breath with exertion.  She is not having any headaches visual or auditory disturbances or difficulties with movement.  No other complaints are verbalized.     PHYSICAL EXAM:  Wt Readings from Last 3 Encounters:  05/20/24 102 lb 6 oz (46.4 kg)  05/17/24 105 lb (47.6 kg)  05/11/24 104 lb 8 oz (47.4 kg)   Temp Readings from Last 3 Encounters:  05/20/24 (!) 97.3 F (36.3 C) (Temporal)  05/18/24 98.1 F (36.7 C) (Oral)  05/11/24 98.1 F (36.7 C)   BP Readings from Last 3 Encounters:  05/20/24 (!) 138/93  05/18/24 (!) 146/90  05/11/24 (!) 138/90   Pulse Readings from Last 3 Encounters:  05/20/24 91  05/18/24 86  05/11/24 (!) 102   Pain Assessment Pain Score: 5  Pain Loc: Chest/10  In general this is a chronically ill-appearing Caucasian female for her age in no acute distress.  She's alert and oriented x4 and appropriate throughout the examination. Cardiopulmonary assessment is negative for acute distress and she exhibits normal effort.     ECOG = 1  0 - Asymptomatic (Fully active, able to carry on all predisease activities without restriction)  1 - Symptomatic but completely  ambulatory (Restricted in physically strenuous activity but ambulatory and able to carry out work of a light or sedentary nature. For example, light housework, office work)  2 - Symptomatic, <50% in bed during the day (Ambulatory and capable of all self care but unable to carry out any work activities. Up and about more than 50% of waking hours)  3 - Symptomatic, >50% in bed, but not bedbound (Capable of only limited self-care, confined to bed or chair 50% or more of waking hours)  4 - Bedbound (Completely disabled. Cannot carry on any self-care. Totally confined to bed or chair)  5 - Death   Aurea Blossom MM, Creech RH, Tormey DC,  et al. [phone]). Toxicity and response criteria of the [facility]. Am. Hillard [employer]. Oncol. 5 (6): 649-55    LABORATORY DATA:  Lab Results  Component Value Date   WBC 6.8 05/18/2024   HGB 9.7 (L) 05/18/2024   HCT 31.0 (L) 05/18/2024   MCV 85.2 05/18/2024   PLT 343 05/18/2024   Lab Results  Component Value Date   NA 137 05/17/2024   K 3.8 05/17/2024   CL 101 05/17/2024   CO2 22 05/17/2024   Lab Results  Component Value Date   ALT 8 05/11/2024   AST 17 05/11/2024   ALKPHOS 96 05/11/2024   BILITOT 0.6 05/11/2024      RADIOGRAPHY: MR BRAIN W WO CONTRAST Result Date: 05/17/2024 CLINICAL DATA:  Initial evaluation for metastatic disease. EXAM: MRI HEAD WITHOUT AND WITH CONTRAST TECHNIQUE: Multiplanar, multiecho pulse sequences of the brain and surrounding structures were obtained without and with intravenous contrast. CONTRAST:  5mL GADAVIST GADOBUTROL 1 MMOL/ML IV SOLN COMPARISON:  None Available. FINDINGS: Brain: Mild age-related cerebral atrophy. Patchy T2/FLAIR hyperintensity involving the periventricular deep white matter both cerebral hemispheres, most like related chronic microvascular ischemic disease, moderately advanced in nature. No evidence for acute or subacute ischemia. No areas of chronic cortical infarction. No acute intracranial  hemorrhage. 1.2 cm rim enhancing lesion positioned along the gray-white matter differentiation of the left temporal occipital junction (series 17, image 57). Mild localized vasogenic edema without significant regional mass effect. Associated susceptibility artifact consistent with blood products and/or necrosis. Finding suspicious for a small metastatic lesion given provided history. There is question of an additional 5 mm focus of enhancement at the superior right cerebellum (series 18, image 8). Associated susceptibility artifact at this location (series 10, image 12). Finding is nonspecific, and could reflect a small additional metastatic lesion or possibly small vascular lesion. No other visible intracranial metastases. No other pathologic enhancement. Ventricles normal size without hydrocephalus. No extra-axial fluid collection. Pituitary gland and suprasellar region within normal limits. Vascular: Major intracranial vascular flow voids are maintained. Skull and upper cervical spine: Craniocervical junction within normal limits. Bone marrow signal intensity normal. No scalp soft tissue abnormality. Sinuses/Orbits: Globes and orbital soft tissues within normal limits. Paranasal sinuses are largely clear. Trace left mastoid effusion noted, of doubtful significance. Other: None. IMPRESSION: 1. 1.2 cm rim enhancing lesion positioned at the left temporoccipital junction, suspicious for a metastatic lesion given provided history. Mild localized edema without significant regional mass effect. 2. Question additional 5 mm focus of enhancement at the superior right cerebellum, nonspecific, and could reflect an additional metastasis or possibly a small vascular lesion. Attention at follow-up recommended. 3. No other acute intracranial abnormality. 4. Underlying age-related cerebral atrophy with moderately advanced chronic microvascular ischemic disease. Electronically Signed   By: Virgia Griffins M.D.   On:  05/17/2024 21:08   DG Chest Portable 1 View Result Date: 05/17/2024 CLINICAL DATA:  Chest pain. EXAM: PORTABLE CHEST 1 VIEW COMPARISON:  Radiographs 04/26/2024 and 12/14/2021. Chest CT 04/21/2024. FINDINGS: 1509 hours. Previously demonstrated right paratracheal adenopathy may be slightly progressive. The heart size is stable. There are right subclavian and right common carotid vascular stents. Pulmonary nodularity seen by CT is not well visualized. The lungs appear clear. No evidence of pleural effusion or pneumothorax. The bones appear unchanged. IMPRESSION: 1. No evidence of acute cardiopulmonary process. 2. Possible slight progression of known right paratracheal adenopathy, remaining suspicious for malignancy. Electronically Signed   By: Elmon Hagedorn M.D.   On:  05/17/2024 16:46   CT ANGIO CHEST PE W OR WO CONTRAST Result Date: 05/17/2024 CLINICAL DATA:  Pulmonary embolism (PE) suspected, high prob. EXAM: CT ANGIOGRAPHY CHEST WITH CONTRAST TECHNIQUE: Multidetector CT imaging of the chest was performed using the standard protocol during bolus administration of intravenous contrast. Multiplanar CT image reconstructions and MIPs were obtained to evaluate the vascular anatomy. RADIATION DOSE REDUCTION: This exam was performed according to the departmental dose-optimization program which includes automated exposure control, adjustment of the mA and/or kV according to patient size and/or use of iterative reconstruction technique. CONTRAST:  75mL OMNIPAQUE  IOHEXOL  350 MG/ML SOLN COMPARISON:  CT scan chest from 04/21/2024. FINDINGS: Cardiovascular: There is small-to-moderate volume predominantly nonocclusive embolism involving the segmental and proximal subsegmental pulmonary artery branches throughout bilateral lungs (marked with electronic arrow sign on series 4). There is no right heart strain or lung infarction. Normal cardiac size. Physiological pericardial effusion. No aortic aneurysm. There are coronary  artery calcifications, in keeping with coronary artery disease. There are also mild peripheral atherosclerotic vascular calcifications of thoracic aorta and its major branches. Mediastinum/Nodes: Visualized thyroid gland appears grossly unremarkable. 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 The mass also abuts ascending aorta, aortic arch and descending thoracic aorta. The fat planes between the mass and the thoracic esophagus are also lost. The esophagus is nondistended precluding optimal assessment. There is mild circumferential thickening of the mid/lower thoracic esophagus, which is most likely seen in the settings of chronic gastroesophageal reflux disease versus esophagitis. No axillary lymphadenopathy by size criteria. Lungs/Pleura: The trachea and left bronchial tree are patent. Redemonstration of an approximately 8 x 17 mm soft tissue nodular thickening along the left wall of the bronchus intermedius, which is similar to the prior study. There are extensive frothy soft tissue in the bronchus intermedius and right lower lobe bronchial tree, favoring mucus/secretions versus aspiration. There are mild-to-moderate upper lobe predominant centrilobular emphysematous changes. There are patchy areas of linear, plate-like atelectasis and/or scarring throughout bilateral lungs. Redemonstration of several in regular solid noncalcified nodules in bilateral lungs, described as follows: *Linear configuration 4 x 19 mm nodule in the left lung upper lobe (series 12, image 34), unchanged since the prior study. *There is a slightly spiculated 7 x 12 mm nodule in the right lung lower lobe (series 12, image 91), unchanged since the prior study. *There are additional stable subcentimeter sized nodularity in the right upper lobe (series 12, images 62 and 78), unchanged since  the prior study. No new or suspicious lung nodule. No new mass or consolidation. No pleural effusion or pneumothorax. Upper Abdomen: There is infiltrating soft tissue surrounding aorta in the visualized portion of upper abdomen which is also grossly similar to the prior study. Remaining visualized upper abdominal viscera within normal limits. Musculoskeletal: The visualized soft tissues of the chest wall are grossly unremarkable. No suspicious osseous lesions. There are mild multilevel degenerative changes in the visualized spine. L1 superior endplate Schmorl's node again seen. Review of the MIP images confirms the above findings. IMPRESSION: 1. There is small-to-moderate volume predominantly nonocclusive embolism involving the segmental and proximal subsegmental pulmonary artery branches throughout bilateral lungs. No right heart strain or lung infarction. 2. Redemonstration of extensive mediastinal soft tissue as well as soft tissue surrounding aorta in the visualized portion of upper abdomen which are grossly similar to the prior study. Findings favor lymphoma versus other metastatic disease. 3.  There are extensive frothy soft tissue in the bronchus intermedius and right lower lobe bronchial tree, favoring mucus/secretions versus aspiration. Stable soft tissue nodular thickening along the left wall of the bronchus intermedius. 4. Stable pre-existing lung nodules, as described above. 5. Multiple other nonacute observations, as described above. Aortic Atherosclerosis (ICD10-I70.0) and Emphysema (ICD10-J43.9). Critical Value/emergent results were called by telephone at the time of interpretation on 05/17/2024 at 4:34 pm to provider Dr. Scarlette Currier, who verbally acknowledged these results. Electronically Signed   By: Beula Brunswick M.D.   On: 05/17/2024 16:36   CT Super D Chest Wo Contrast Result Date: 05/04/2024 CLINICAL DATA:  Mediastinal adenopathy.  Pulmonary nodules EXAM: CT CHEST WITHOUT CONTRAST  TECHNIQUE: Multidetector CT imaging of the chest was performed using thin slice collimation for electromagnetic bronchoscopy planning purposes, without intravenous contrast. RADIATION DOSE REDUCTION: This exam was performed according to the departmental dose-optimization program which includes automated exposure control, adjustment of the mA and/or kV according to patient size and/or use of iterative reconstruction technique. COMPARISON:  CT chest 03/24/2024 FINDINGS: Cardiovascular: No acute vascular findings on noncontrast CT. Stent in the innominate artery. Mediastinum/Nodes: Bulky subcarinal and RIGHT paratracheal adenopathy again demonstrated. No similar size to comparison CT Lungs/Pleura: Centrilobular emphysema the upper lobes. Peripheral nodule in the RIGHT upper lobe measuring 6 mm on image 58/4 unchanged. RIGHT lower lobe nodule measuring 13 mm peribronchial thickening on image 89/4 also unchanged. Upper Abdomen: Limited view of the liver, kidneys, pancreas are unremarkable. Normal adrenal glands. Musculoskeletal: No aggressive osseous lesion. IMPRESSION: 1. Stable mediastinal adenopathy. 2. Stable RIGHT upper lobe and RIGHT lower lobe pulmonary nodules. 3.  Emphysema (ICD10-J43.9). Electronically Signed   By: Deboraha Fallow M.D.   On: 05/04/2024 11:46   DG Chest Port 1 View Result Date: 04/26/2024 CLINICAL DATA:  Status post bronchoscopy and biopsy. EXAM: PORTABLE CHEST 1 VIEW COMPARISON:  Chest radiograph dated 12/14/2021. FINDINGS: No pneumothorax. Background of emphysema and interstitial coarsening. No consolidative changes or pleural effusion. The cardiac silhouette is within limits the. No acute osseous pathology. IMPRESSION: 1. No pneumothorax. 2. Emphysema. Electronically Signed   By: Angus Bark M.D.   On: 04/26/2024 15:14   DG C-ARM BRONCHOSCOPY Result Date: 04/26/2024 C-ARM BRONCHOSCOPY: Fluoroscopy was utilized by the requesting physician.  No radiographic interpretation.        IMPRESSION/PLAN: 1. Stage IV non-small cell lung cancer, mixed histology of squamous cell and adenocarcinoma likely arising in the right lower lobe but involving the right bronchus intermedius and left mainstem and regional lymph nodes with at least a solitary metastatic to brain lesion. Dr. Jeryl Moris discusses the pathology findings and reviews the nature of metastatic disease.  The patient is going to be having a PET scan to better outline her extent of disease.  Dr. Jeryl Moris discusses that we may be able to consider offering a definitive treatment to the chest provided that she does not have evidence of metastatic disease grossly in the chest abdomen or pelvis.  That being said, she does have what appears to be at least a solitary lesion in the brain that appears consistent with metastasis possibly an additional lesion that was felt to be indeterminant on 1.5 Tesla MRI.  We discussed that she may be a good candidate for stereotactic radiosurgery and outlined the process to determine eligibility which includes a 3 Tesla MRI scan of the brain, evaluation with neurosurgery, simulation with IV contrast.  The patient is motivated as well to proceed in this manner.  Written consent  is obtained for her to receive stereotactic radiosurgery in 3 fractions and she is aware that the 3T MRI will guide the planning as well as determining if there are any other sites to treat.  Regarding her chest we will follow-up with the results of her PET scan and we have contacted nuclear medicine to see if her scan can be performed any sooner.  She is in agreement with this plan.  We will coordinate with Dr. Marguerita Shih as well.  She will be contacted by our special procedures navigator to coordinate her MRI scan and simulation as well as treatment.  We discussed risks, benefits, short and long-term effects of radiotherapy.     In a visit lasting 60 minutes, greater than 50% of the time was spent face to face discussing the patient's  condition, in preparation for the discussion, and coordinating the patient's care.   The above documentation reflects my direct findings during this shared patient visit. Please see the separate note by Dr. Jeryl Moris on this date for the remainder of the patient's plan of care.    Shelvia Dick, [facility]   **Disclaimer: This note was dictated with voice recognition software. Similar sounding words can inadvertently be transcribed and this note may contain transcription errors which may not have been corrected upon publication of note.**

## 2024-05-20 NOTE — Discharge Instructions (Signed)
 Contact a health care provider if:  Your COPD symptoms get worse.  You have a fever or chills.  You have trouble doing daily activities.  You have trouble breathing even when you are resting.    Get help right away if:  You are short of breath and cannot:  Talk in full sentences.  Do normal activities.    You have chest pain.  You feel confused.  These symptoms may be an emergency. Call 911 right away.  Do not wait to see if the symptoms will go away.  Do not drive yourself to the hospital.

## 2024-05-20 NOTE — ED Triage Notes (Signed)
 Patient arrives via [facility] for increased work of breathing. EMS reports initial resp count of 60 despite using albuterol  inhaler. Given 15 mg albuterol , 1mg  Atrovent, 2G mag, and 125 mg solu-medrol by EMS. Hx of stage 3 lung cancer. Wheezing noted throughout. Currently on Lovenox . Provider at bedside.

## 2024-05-20 NOTE — Addendum Note (Signed)
 Encounter addended by: Tali Coster M, RN on: 05/20/2024 9:50 AM  Actions taken: Visit diagnoses modified

## 2024-05-20 NOTE — Telephone Encounter (Signed)
 Patient called to check on the status of disability paper work she dropped off for Dr. Baldwin Levee to complete. Please contact her when it is ready to be picked up. Thanks

## 2024-05-20 NOTE — Telephone Encounter (Signed)
 I spoke with Ms. Payson to introduce myself and shared my contact information for support during her brain SRS set up, delivery and follow-up. She is aware of the brain MRI and simulation appointments that are scheduled. I will call her back with updates as soon as I hear back from the neurosurgeon office with their provider consult information and treatment dates.  Axel Bohr R.T(R)(T) Radiation Special Procedures Lead

## 2024-05-21 ENCOUNTER — Other Ambulatory Visit: Payer: Self-pay | Admitting: Radiation Therapy

## 2024-05-21 ENCOUNTER — Ambulatory Visit
Admission: RE | Admit: 2024-05-21 | Discharge: 2024-05-21 | Disposition: A | Discharge status: Home / Self Care | Source: Ambulatory Visit | Attending: Acute Care | Admitting: Acute Care

## 2024-05-21 DIAGNOSIS — R911 Solitary pulmonary nodule: Secondary | ICD-10-CM | POA: Diagnosis not present

## 2024-05-21 DIAGNOSIS — R937 Abnormal findings on diagnostic imaging of other parts of musculoskeletal system: Secondary | ICD-10-CM | POA: Insufficient documentation

## 2024-05-21 DIAGNOSIS — C3431 Malignant neoplasm of lower lobe, right bronchus or lung: Secondary | ICD-10-CM | POA: Diagnosis not present

## 2024-05-21 DIAGNOSIS — R590 Localized enlarged lymph nodes: Secondary | ICD-10-CM | POA: Insufficient documentation

## 2024-05-21 DIAGNOSIS — Z87891 Personal history of nicotine dependence: Secondary | ICD-10-CM | POA: Diagnosis not present

## 2024-05-21 DIAGNOSIS — C3480 Malignant neoplasm of overlapping sites of unspecified bronchus and lung: Secondary | ICD-10-CM | POA: Insufficient documentation

## 2024-05-21 DIAGNOSIS — C7931 Secondary malignant neoplasm of brain: Secondary | ICD-10-CM

## 2024-05-21 LAB — GLUCOSE, CAPILLARY: Glucose-Capillary: 146 mg/dL — ABNORMAL HIGH (ref 70–99)

## 2024-05-21 MED ORDER — FLUDEOXYGLUCOSE F - 18 (FDG) INJECTION
5.6100 | Freq: Once | INTRAVENOUS | Status: AC | PRN
  Administered 2024-05-21: 5.61 via INTRAVENOUS

## 2024-05-21 NOTE — Telephone Encounter (Signed)
 Called PT to adv we have signed copy. She said to mail it to her. I did so and I put a copy in the accordion file

## 2024-05-23 ENCOUNTER — Other Ambulatory Visit: Payer: Self-pay

## 2024-05-23 ENCOUNTER — Emergency Department (HOSPITAL_COMMUNITY)
Admission: EM | Admit: 2024-05-23 | Discharge: 2024-05-24 | Disposition: A | Discharge status: Home / Self Care | Attending: Emergency Medicine | Admitting: Emergency Medicine

## 2024-05-23 ENCOUNTER — Emergency Department (HOSPITAL_COMMUNITY)

## 2024-05-23 DIAGNOSIS — Z85118 Personal history of other malignant neoplasm of bronchus and lung: Secondary | ICD-10-CM | POA: Insufficient documentation

## 2024-05-23 DIAGNOSIS — F419 Anxiety disorder, unspecified: Secondary | ICD-10-CM | POA: Insufficient documentation

## 2024-05-23 DIAGNOSIS — Z7901 Long term (current) use of anticoagulants: Secondary | ICD-10-CM | POA: Diagnosis not present

## 2024-05-23 DIAGNOSIS — C761 Malignant neoplasm of thorax: Secondary | ICD-10-CM | POA: Diagnosis not present

## 2024-05-23 DIAGNOSIS — R55 Syncope and collapse: Secondary | ICD-10-CM | POA: Diagnosis not present

## 2024-05-23 DIAGNOSIS — J432 Centrilobular emphysema: Secondary | ICD-10-CM | POA: Diagnosis not present

## 2024-05-23 DIAGNOSIS — I700 Atherosclerosis of aorta: Secondary | ICD-10-CM | POA: Diagnosis not present

## 2024-05-23 DIAGNOSIS — C3490 Malignant neoplasm of unspecified part of unspecified bronchus or lung: Secondary | ICD-10-CM | POA: Diagnosis not present

## 2024-05-23 LAB — BASIC METABOLIC PANEL WITH GFR
Anion gap: 16 — ABNORMAL HIGH (ref 5–15)
BUN: 19 mg/dL (ref 8–23)
CO2: 19 mmol/L — ABNORMAL LOW (ref 22–32)
Calcium: 10.6 mg/dL — ABNORMAL HIGH (ref 8.9–10.3)
Chloride: 101 mmol/L (ref 98–111)
Creatinine, Ser: 0.83 mg/dL (ref 0.44–1.00)
GFR, Estimated: >60 mL/min (ref >60)
Glucose, Bld: 98 mg/dL (ref 70–99)
Potassium: 3.7 mmol/L (ref 3.5–5.1)
Sodium: 136 mmol/L (ref 135–145)

## 2024-05-23 LAB — CBC
HCT: 35.4 % — ABNORMAL LOW (ref 36.0–46.0)
Hemoglobin: 10.9 g/dL — ABNORMAL LOW (ref 12.0–15.0)
MCH: 25.9 pg — ABNORMAL LOW (ref 26.0–34.0)
MCHC: 30.8 g/dL (ref 30.0–36.0)
MCV: 84.1 fL (ref 80.0–100.0)
Platelets: 479 10*3/uL — ABNORMAL HIGH (ref 150–400)
RBC: 4.21 MIL/uL (ref 3.87–5.11)
RDW: 15.4 % (ref 11.5–15.5)
WBC: 8.6 10*3/uL (ref 4.0–10.5)
nRBC: 0 % (ref 0.0–0.2)

## 2024-05-23 LAB — TROPONIN I (HIGH SENSITIVITY): Troponin I (High Sensitivity): 9 ng/L (ref <18)

## 2024-05-23 NOTE — ED Triage Notes (Signed)
 Pt reports that she feels like she is going to pass out x 2 days with worsening today. Pt has Stage 3 lung cancer and recently diagnosed with a PE. Pt reports chest pain and SOB, as well.

## 2024-05-24 ENCOUNTER — Other Ambulatory Visit: Payer: Self-pay | Admitting: Radiation Therapy

## 2024-05-24 ENCOUNTER — Telehealth: Payer: Self-pay | Admitting: Radiation Therapy

## 2024-05-24 ENCOUNTER — Emergency Department (HOSPITAL_COMMUNITY)

## 2024-05-24 ENCOUNTER — Other Ambulatory Visit

## 2024-05-24 DIAGNOSIS — I700 Atherosclerosis of aorta: Secondary | ICD-10-CM | POA: Diagnosis not present

## 2024-05-24 DIAGNOSIS — C761 Malignant neoplasm of thorax: Secondary | ICD-10-CM | POA: Diagnosis not present

## 2024-05-24 DIAGNOSIS — C7931 Secondary malignant neoplasm of brain: Secondary | ICD-10-CM

## 2024-05-24 DIAGNOSIS — J432 Centrilobular emphysema: Secondary | ICD-10-CM | POA: Diagnosis not present

## 2024-05-24 DIAGNOSIS — C3490 Malignant neoplasm of unspecified part of unspecified bronchus or lung: Secondary | ICD-10-CM | POA: Diagnosis not present

## 2024-05-24 LAB — TROPONIN I (HIGH SENSITIVITY): Troponin I (High Sensitivity): 9 ng/L (ref <18)

## 2024-05-24 MED ORDER — OXYCODONE-ACETAMINOPHEN 5-325 MG PO TABS
1.0000 | ORAL_TABLET | Freq: Once | ORAL | Status: AC
  Administered 2024-05-24: 1 via ORAL
  Filled 2024-05-24: qty 1

## 2024-05-24 MED ORDER — DIPHENHYDRAMINE HCL 50 MG/ML IJ SOLN
25.0000 mg | Freq: Once | INTRAMUSCULAR | Status: AC
  Administered 2024-05-24: 25 mg via INTRAVENOUS
  Filled 2024-05-24: qty 1

## 2024-05-24 MED ORDER — ALPRAZOLAM 1 MG PO TABS: 1.0000 mg | ORAL_TABLET | Freq: Three times a day (TID) | ORAL | 0 refills | Status: DC | PRN

## 2024-05-24 MED ORDER — IOHEXOL 350 MG/ML SOLN
75.0000 mL | Freq: Once | INTRAVENOUS | Status: AC | PRN
  Administered 2024-05-24: 75 mL via INTRAVENOUS

## 2024-05-24 MED ORDER — METOCLOPRAMIDE HCL 5 MG/ML IJ SOLN
10.0000 mg | Freq: Once | INTRAMUSCULAR | Status: AC
  Administered 2024-05-24: 10 mg via INTRAVENOUS
  Filled 2024-05-24: qty 2

## 2024-05-24 MED ORDER — LORAZEPAM 2 MG/ML IJ SOLN
1.0000 mg | Freq: Once | INTRAMUSCULAR | Status: AC
  Administered 2024-05-24: 1 mg via INTRAVENOUS
  Filled 2024-05-24: qty 1

## 2024-05-24 NOTE — Telephone Encounter (Signed)
 Returned a call to Ms. Peltzer. No answer, left a detailed message including appointment information for her consult with Dr. Andy Bannister on 6/23 and requested that she call back before I enter in referrals to social work and spiritual support.   Axel Bohr R.T.(R)(T) Radiation Special Procedures Lead

## 2024-05-24 NOTE — ED Notes (Signed)
 Discharge instructions reviewed.   Newly prescribed medications discussed. Pharmacy verified.   Opportunity for questions and concerns provided.   Alert, oriented and escorted to significant others vehicle via wheelchair. Kristina Howe   Displays no signs of distress.

## 2024-05-24 NOTE — ED Provider Notes (Signed)
 [place] EMERGENCY DEPARTMENT AT [facility] Provider Note   CSN: [id number] Arrival date & time: 05/23/24  2128     Patient presents with: Near Syncope   Kristina Howe is a 63 y.o. female.  {Add pertinent medical, surgical, social history, OB history to WUJ:81191} Patient presents to the emergency department for evaluation of multiple episodes of near syncope.  Patient reports that she was recently diagnosed with lung cancer and PE.  She has not started treatment for her lung cancer.  Patient was previously on Eliquis , had persistent PE and is now on Lovenox  for treatment.  She reports compliance.  Patient reports that intermittently she feels like she cannot breathe, her breathing gets fast.  She feels very anxious.  She is taking Xanax  0.5 mg 3 times a day but feels like it is not enough.       Prior to Admission medications   Medication Sig Start Date End Date Taking? Authorizing Provider  albuterol  (VENTOLIN  HFA) 108 (90 Base) MCG/ACT inhaler Inhale 2 puffs into the lungs every 6 (six) hours as needed for wheezing or shortness of breath. 05/18/24   Vada Garibaldi, MD  ALPRAZolam  (XANAX ) 0.5 MG tablet Take 0.5 mg by mouth 3 (three) times daily as needed for sleep. 05/04/24 06/03/24  [provider]  atorvastatin  (LIPITOR) 40 MG tablet Take 1 tablet (40 mg total) by mouth daily. 03/12/24   Haydee Lipa, MD  enoxaparin  (LOVENOX ) 60 MG/0.6ML injection Inject 0.5 mLs (50 mg total) into the skin every 12 (twelve) hours. 05/18/24   Vada Garibaldi, MD  FLUoxetine  (PROZAC ) 20 MG capsule Take 20 mg by mouth daily.    [provider]  magnesium  30 MG tablet Take 30 mg by mouth 2 (two) times daily.    [provider]  methylPREDNISolone  (MEDROL  DOSEPAK) 4 MG TBPK tablet Use as directed 05/20/24   Harris, Abigail, PA-C  Multiple Vitamins-Minerals (MULTIVITAMIN WITH MINERALS) tablet Take 1 tablet by mouth daily.    [provider]  oxyCODONE  (OXY  IR/ROXICODONE ) 5 MG immediate release tablet Take 1 tablet (5 mg total) by mouth every 6 (six) hours as needed for severe pain (pain score 7-10). 05/18/24   Ghimire, Kuber, MD  PERMETHRIN EX PORTABLE OXYGEN TANK for Dx: HYPOXIA 05/19/24   [provider]  triamterene -hydrochlorothiazide  (MAXZIDE -25) 37.5-25 MG per tablet Take 1 tablet by mouth daily.      [provider]  valsartan (DIOVAN) 320 MG tablet Take 320 mg by mouth daily. 12/31/23   [provider]    Allergies: Patient has no known allergies.    Review of Systems  Respiratory:  Positive for shortness of breath.   Psychiatric/Behavioral:  The patient is nervous/anxious.     Updated Vital Signs BP (!) 152/91   Pulse 90   Temp 97.9 F (36.6 C)   Resp 15   SpO2 98%   Physical Exam Vitals and nursing note reviewed.  Constitutional:      General: She is not in acute distress.    Appearance: She is well-developed.  HENT:     Head: Normocephalic and atraumatic.     Mouth/Throat:     Mouth: Mucous membranes are moist.   Eyes:     General: Vision grossly intact. Gaze aligned appropriately.     Extraocular Movements: Extraocular movements intact.     Conjunctiva/sclera: Conjunctivae normal.    Cardiovascular:     Rate and Rhythm: Normal rate and regular rhythm.  Pulses: Normal pulses.     Heart sounds: Normal heart sounds, S1 normal and S2 normal. No murmur heard.    No friction rub. No gallop.  Pulmonary:     Effort: Pulmonary effort is normal. Tachypnea present. No respiratory distress.     Breath sounds: Normal breath sounds.  Abdominal:     General: Bowel sounds are normal.     Palpations: Abdomen is soft.     Tenderness: There is no abdominal tenderness. There is no guarding or rebound.     Hernia: No hernia is present.   Musculoskeletal:        General: No swelling.     Cervical back: Full passive range of motion without pain, normal range of motion and neck supple. No spinous  process tenderness or muscular tenderness. Normal range of motion.     Right lower leg: No edema.     Left lower leg: No edema.   Skin:    General: Skin is warm and dry.     Capillary Refill: Capillary refill takes less than 2 seconds.     Findings: No ecchymosis, erythema, rash or wound.   Neurological:     General: No focal deficit present.     Mental Status: She is alert and oriented to person, place, and time.     GCS: GCS eye subscore is 4. GCS verbal subscore is 5. GCS motor subscore is 6.     Cranial Nerves: Cranial nerves 2-12 are intact.     Sensory: Sensation is intact.     Motor: Motor function is intact.     Coordination: Coordination is intact.   Psychiatric:        Attention and Perception: Attention normal.        Mood and Affect: Mood normal.        Speech: Speech normal.        Behavior: Behavior normal.     (all labs ordered are listed, but only abnormal results are displayed) Labs Reviewed  BASIC METABOLIC PANEL WITH GFR - Abnormal; Notable for the following components:      Result Value   CO2 19 (*)    Calcium  10.6 (*)    Anion gap 16 (*)    All other components within normal limits  CBC - Abnormal; Notable for the following components:   Hemoglobin 10.9 (*)    HCT 35.4 (*)    MCH 25.9 (*)    Platelets 479 (*)    All other components within normal limits  TROPONIN I (HIGH SENSITIVITY)  TROPONIN I (HIGH SENSITIVITY)      EKG: None  Radiology: DG Chest Portable 1 View Result Date: 05/23/2024 CLINICAL DATA:  Syncope Pt reports that she feels like she is going to pass out x 2 days with worsening today. Pt has Stage 3 lung cancer and recently diagnosed with a PE. Pt reports chest pain and SOB, as well. EXAM: PORTABLE CHEST 1 VIEW COMPARISON:  Chest x-ray 05/20/2024, CT chest 05/17/2024 FINDINGS: The heart and mediastinal contours are unchanged with superior mediastinal irregular contour best evaluated on CT angio chest 07/17/2024. Chronic coarsened  interstitial markings with no overt pulmonary edema. No focal consolidation. No pleural effusion. No pneumothorax. No acute osseous abnormality. IMPRESSION: 1. No acute cardiopulmonary disease. 2. Superior mediastinal irregular contour best evaluated on CT angio chest 07/17/2024. Electronically Signed   By: Morgane  Naveau M.D.   On: 05/23/2024 22:11    {Document cardiac monitor, telemetry assessment procedure when appropriate:32947} Procedures  Medications Ordered in the ED  LORazepam  (ATIVAN ) injection 1 mg (1 mg Intravenous Given 05/24/24 0043)      {Click here for ABCD2, HEART and other calculators REFRESH Note before signing:1}                              Medical Decision Making Amount and/or Complexity of Data Reviewed External Data Reviewed: labs, radiology, ECG and notes. Labs: ordered. Decision-making details documented in ED Course. Radiology: ordered and independent interpretation performed. Decision-making details documented in ED Course. ECG/medicine tests: independent interpretation performed. Decision-making details documented in ED Course.  Risk Prescription drug management.   Patient presents to the emergency department for evaluation of shortness of breath and anxiety.  Differential diagnosis includes anxiety/panic attack, pneumonia, worsening PE  Patient does appear extremely anxious.  She improved with Ativan .  She is not hypoxic.  Cardiac evaluation unremarkable.  Troponins negative, no sign of heart strain.  EKG unchanged from prior.  Patient underwent CT angiography to further evaluate clot burden.  There is no change.  Will discharge with increased dose of Xanax , follow-up with PCP and oncology.  {Document critical care time when appropriate  Document review of labs and clinical decision tools ie CHADS2VASC2, etc  Document your independent review of radiology images and any outside records  Document your discussion with family members, caretakers and with  consultants  Document social determinants of health affecting pt's care  Document your decision making why or why not admission, treatments were needed:32947:::1}   Final diagnoses:  Anxiety    ED Discharge Orders     None

## 2024-05-25 ENCOUNTER — Inpatient Hospital Stay: Admitting: Licensed Clinical Social Worker

## 2024-05-25 ENCOUNTER — Inpatient Hospital Stay: Admitting: Physician Assistant

## 2024-05-25 DIAGNOSIS — C3431 Malignant neoplasm of lower lobe, right bronchus or lung: Secondary | ICD-10-CM

## 2024-05-25 NOTE — Progress Notes (Signed)
 Has armband been applied?  {yes no:314532}  Does patient have an allergy to IV contrast dye?: {yes no:314532}   Has patient ever received premedication for IV contrast dye?: {yes no:314532}   Does patient take metformin?: {yes no:314532}  If patient does take metformin when was the last dose: {Time; dates multiple:15870}  Date of lab work: 05/23/2024 BUN: 19 CR: 0.83 eGfr: >60  IV site: {iv locations:314275}  Has IV site been added to flowsheet?  {yes no:314532}  There were no vitals taken for this visit.

## 2024-05-25 NOTE — Progress Notes (Signed)
 [facility] CSW Progress Note  Clinical Child psychotherapist contacted pt by to check in regarding anxiety.  Pt reports her anxiety has progressively gotten worse since diagnosis even w/ xanax .   Pt states she would be interested in trying counseling.  Appointment scheduled w/ CSW 6/19 following radiation.      Quenton Bruns, LCSW Clinical Social Worker [facility]

## 2024-05-26 ENCOUNTER — Ambulatory Visit (HOSPITAL_COMMUNITY)
Admission: RE | Admit: 2024-05-26 | Discharge: 2024-05-26 | Disposition: A | Discharge status: Home / Self Care | Source: Ambulatory Visit | Attending: Radiation Oncology | Admitting: Radiation Oncology

## 2024-05-26 DIAGNOSIS — C7931 Secondary malignant neoplasm of brain: Secondary | ICD-10-CM | POA: Diagnosis not present

## 2024-05-26 DIAGNOSIS — R9082 White matter disease, unspecified: Secondary | ICD-10-CM | POA: Diagnosis not present

## 2024-05-26 MED ORDER — GADOBUTROL 1 MMOL/ML IV SOLN
5.0000 mL | Freq: Once | INTRAVENOUS | Status: AC | PRN
Start: 2024-05-26 — End: 2024-05-26
  Administered 2024-05-26: 5 mL via INTRAVENOUS

## 2024-05-27 ENCOUNTER — Ambulatory Visit
Admission: RE | Admit: 2024-05-27 | Discharge: 2024-05-27 | Disposition: A | Discharge status: Home / Self Care | Source: Ambulatory Visit | Attending: Radiation Oncology | Admitting: Radiation Oncology

## 2024-05-27 ENCOUNTER — Inpatient Hospital Stay: Admitting: Licensed Clinical Social Worker

## 2024-05-27 ENCOUNTER — Other Ambulatory Visit: Admitting: Licensed Clinical Social Worker

## 2024-05-27 VITALS — BP 144/87 | HR 93 | Temp 97.5°F | Resp 18 | Ht 67.0 in | Wt 96.5 lb

## 2024-05-27 DIAGNOSIS — Z87891 Personal history of nicotine dependence: Secondary | ICD-10-CM | POA: Diagnosis not present

## 2024-05-27 DIAGNOSIS — C3431 Malignant neoplasm of lower lobe, right bronchus or lung: Secondary | ICD-10-CM

## 2024-05-27 DIAGNOSIS — C7931 Secondary malignant neoplasm of brain: Secondary | ICD-10-CM

## 2024-05-27 DIAGNOSIS — Z510 Encounter for antineoplastic radiation therapy: Secondary | ICD-10-CM | POA: Diagnosis not present

## 2024-05-27 NOTE — Progress Notes (Signed)
 [facility] CSW Counseling Note  Patient was referred by medical provider. Treatment type: Individual  Presenting Concerns: Patient and/or family reports the following symptoms/concerns: anxiety Duration of problem: 2 months; Severity of problem: severe   Orientation:oriented to person, place, time/date, situation, day of week, month of year, and year.   Affect: Appropriate and Congruent Risk of harm to self or others: No plan to harm self or others  Patient and/or Family's Strengths/Protective Factors: Social connections and [place] supports in place (healthy food, safe environments, etc.)Motivation for treatment/growth  Supportive family/friends      Goals Addressed: Patient will:  Reduce symptoms of: anxiety Increase knowledge and/or ability of: coping skills and self-management skills  Increase healthy adjustment to current life circumstances   Progress towards Goals: Initial   Interventions: Interventions utilized:  Solution Focused, Supportive, and Reframing      Assessment: Patient currently experiencing extreme anxiety following diagnosis of lung cancer.  Pt reports experiencing anxiety at baseline for many years for which she has taken ativan  PRN.  Per pt since discharging from the hospital she is experiencing panic attacks and has needed to increase the dose of ativan .  Pt is still experiencing anxiety even w/ increased dose of medication.  Pt reports she has had several episodes where she has begun to hyperventilate and has been unable to calm herself.  Pt's significant other is supportive and has been trying to assist pt by having her breath into a paper bag which she reports is helpful.  CSW discussed pt's feelings surrounding diagnosis and treatment at length w/ pt providing emotional support.  Pt able to identify a fear of dying because she stops breathing.  CSW demonstrated multiple calming techniques for pt to try at home.  CSW also encouraged pt to purchase a pulse oximeter  as it would provide proof she is saturating well.  CSW met separately w/ pt's significant other and sister at pt's request to explain the source of anxiety as well as suggested techniques for calming pt.        Plan: Follow up with CSW: CSW to meet w/ pt following her next appt w/ Dr. Anson Kinnier recommendations:  Referral(s):        Quenton Bruns, LCSW

## 2024-05-30 NOTE — Progress Notes (Unsigned)
 Patient taken to ER prior to being seen due to hypoxia and shortness of breath in the lobby.

## 2024-05-31 ENCOUNTER — Encounter (HOSPITAL_COMMUNITY)

## 2024-05-31 ENCOUNTER — Ambulatory Visit: Payer: Self-pay | Admitting: Emergency Medicine

## 2024-05-31 DIAGNOSIS — Z681 Body mass index (BMI) 19 or less, adult: Secondary | ICD-10-CM | POA: Diagnosis not present

## 2024-05-31 DIAGNOSIS — C7931 Secondary malignant neoplasm of brain: Secondary | ICD-10-CM | POA: Diagnosis not present

## 2024-05-31 NOTE — Telephone Encounter (Signed)
 Copied from CRM [id number]. Topic: Clinical - Red Word Triage >> May 31, 2024  4:36 PM Shona S wrote: Red Word that prompted transfer to Nurse Triage: difficulty breathing, need oxygen Reason for Disposition  [1] MILD difficulty breathing (e.g., minimal/no SOB at rest, SOB with walking, pulse <100) AND [2] NEW-onset or WORSE than normal  Answer Assessment - Initial Assessment Questions 1. RESPIRATORY STATUS: Describe your breathing? (e.g., wheezing, shortness of breath, unable to speak, severe coughing)      Shortness of breath 2. ONSET: When did this breathing problem begin?      Patient states the shortness of breath has been going on for awhile and states she feels that she needs oxygen.  3. PATTERN Does the difficult breathing come and go, or has it been constant since it started?      Worse with activity  4. SEVERITY: How bad is your breathing? (e.g., mild, moderate, severe)    - MILD: No SOB at rest, mild SOB with walking, speaks normally in sentences, can lie down, no retractions, pulse < 100.    - MODERATE: SOB at rest, SOB with minimal exertion and prefers to sit, cannot lie down flat, speaks in phrases, mild retractions, audible wheezing, pulse 100-120.    - SEVERE: Very SOB at rest, speaks in single words, struggling to breathe, sitting hunched forward, retractions, pulse > 120      Mild shortness of breath 5. RECURRENT SYMPTOM: Have you had difficulty breathing before? If Yes, ask: When was the last time? and What happened that time?      yes 6. CARDIAC HISTORY: Do you have any history of heart disease? (e.g., heart attack, angina, bypass surgery, angioplasty)      no 7. LUNG HISTORY: Do you have any history of lung disease?  (e.g., pulmonary embolus, asthma, emphysema)     Lung CA 8. CAUSE: What do you think is causing the breathing problem?      unsure 9. OTHER SYMPTOMS: Do you have any other symptoms? (e.g., dizziness, runny nose, cough, chest pain,  fever)     Cough-coughs up white sputum 10. O2 SATURATION MONITOR:  Do you use an oxygen saturation monitor (pulse oximeter) at home? If Yes, ask: What is your reading (oxygen level) today? What is your usual oxygen saturation reading? (e.g., 95%)       94% on room air 12. TRAVEL: Have you traveled out of the country in the last month? (e.g., travel history, exposures)       No  Patient calling with the main objective of wanting to figure out how she can get oxygen ordered. Patient states she has been to the ED four times due to shortness of breath. Patient endorses that she feels short of breath with any activity. Patient states she is quite anxious and reports her anxiety medication was increased by ED provider during her last visit. Patient is asking for follow up call from pulmonary.  Protocols used: Breathing Difficulty-A-AH

## 2024-06-01 ENCOUNTER — Encounter (HOSPITAL_COMMUNITY): Payer: Self-pay | Admitting: Internal Medicine

## 2024-06-01 ENCOUNTER — Other Ambulatory Visit (HOSPITAL_COMMUNITY): Payer: Self-pay

## 2024-06-01 ENCOUNTER — Telehealth: Payer: Self-pay | Admitting: Medical Oncology

## 2024-06-01 ENCOUNTER — Inpatient Hospital Stay: Admitting: Physician Assistant

## 2024-06-01 ENCOUNTER — Inpatient Hospital Stay (HOSPITAL_COMMUNITY)
Admission: EM | Admit: 2024-06-01 | Discharge: 2024-06-04 | DRG: 193 | Disposition: A | Discharge status: Home / Self Care | Attending: Internal Medicine | Admitting: Internal Medicine

## 2024-06-01 ENCOUNTER — Emergency Department (HOSPITAL_COMMUNITY)

## 2024-06-01 ENCOUNTER — Other Ambulatory Visit: Payer: Self-pay

## 2024-06-01 ENCOUNTER — Inpatient Hospital Stay

## 2024-06-01 DIAGNOSIS — Z95828 Presence of other vascular implants and grafts: Secondary | ICD-10-CM | POA: Diagnosis not present

## 2024-06-01 DIAGNOSIS — R627 Adult failure to thrive: Secondary | ICD-10-CM | POA: Diagnosis present

## 2024-06-01 DIAGNOSIS — Z515 Encounter for palliative care: Secondary | ICD-10-CM | POA: Diagnosis not present

## 2024-06-01 DIAGNOSIS — C7951 Secondary malignant neoplasm of bone: Secondary | ICD-10-CM | POA: Diagnosis present

## 2024-06-01 DIAGNOSIS — I959 Hypotension, unspecified: Secondary | ICD-10-CM | POA: Diagnosis not present

## 2024-06-01 DIAGNOSIS — Z86711 Personal history of pulmonary embolism: Secondary | ICD-10-CM | POA: Diagnosis not present

## 2024-06-01 DIAGNOSIS — J9601 Acute respiratory failure with hypoxia: Secondary | ICD-10-CM | POA: Diagnosis present

## 2024-06-01 DIAGNOSIS — R590 Localized enlarged lymph nodes: Secondary | ICD-10-CM

## 2024-06-01 DIAGNOSIS — R0902 Hypoxemia: Principal | ICD-10-CM

## 2024-06-01 DIAGNOSIS — J440 Chronic obstructive pulmonary disease with acute lower respiratory infection: Secondary | ICD-10-CM | POA: Diagnosis not present

## 2024-06-01 DIAGNOSIS — Z7901 Long term (current) use of anticoagulants: Secondary | ICD-10-CM | POA: Diagnosis not present

## 2024-06-01 DIAGNOSIS — I10 Essential (primary) hypertension: Secondary | ICD-10-CM | POA: Diagnosis present

## 2024-06-01 DIAGNOSIS — Z66 Do not resuscitate: Secondary | ICD-10-CM | POA: Diagnosis not present

## 2024-06-01 DIAGNOSIS — R918 Other nonspecific abnormal finding of lung field: Secondary | ICD-10-CM | POA: Diagnosis not present

## 2024-06-01 DIAGNOSIS — C3431 Malignant neoplasm of lower lobe, right bronchus or lung: Secondary | ICD-10-CM | POA: Diagnosis not present

## 2024-06-01 DIAGNOSIS — R0602 Shortness of breath: Secondary | ICD-10-CM | POA: Diagnosis not present

## 2024-06-01 DIAGNOSIS — C799 Secondary malignant neoplasm of unspecified site: Secondary | ICD-10-CM

## 2024-06-01 DIAGNOSIS — J189 Pneumonia, unspecified organism: Secondary | ICD-10-CM | POA: Diagnosis not present

## 2024-06-01 DIAGNOSIS — F419 Anxiety disorder, unspecified: Secondary | ICD-10-CM | POA: Diagnosis not present

## 2024-06-01 DIAGNOSIS — C7931 Secondary malignant neoplasm of brain: Secondary | ICD-10-CM | POA: Diagnosis present

## 2024-06-01 DIAGNOSIS — E7800 Pure hypercholesterolemia, unspecified: Secondary | ICD-10-CM | POA: Diagnosis not present

## 2024-06-01 DIAGNOSIS — Z79899 Other long term (current) drug therapy: Secondary | ICD-10-CM

## 2024-06-01 DIAGNOSIS — Z90710 Acquired absence of both cervix and uterus: Secondary | ICD-10-CM

## 2024-06-01 DIAGNOSIS — Z87891 Personal history of nicotine dependence: Secondary | ICD-10-CM | POA: Diagnosis not present

## 2024-06-01 DIAGNOSIS — D630 Anemia in neoplastic disease: Secondary | ICD-10-CM | POA: Diagnosis not present

## 2024-06-01 DIAGNOSIS — C3491 Malignant neoplasm of unspecified part of right bronchus or lung: Secondary | ICD-10-CM | POA: Diagnosis present

## 2024-06-01 DIAGNOSIS — J9611 Chronic respiratory failure with hypoxia: Secondary | ICD-10-CM | POA: Diagnosis present

## 2024-06-01 LAB — CBC WITH DIFFERENTIAL/PLATELET
Abs Immature Granulocytes: 0.02 10*3/uL (ref 0.00–0.07)
Basophils Absolute: 0.1 10*3/uL (ref 0.0–0.1)
Basophils Relative: 1 %
Eosinophils Absolute: 0 10*3/uL (ref 0.0–0.5)
Eosinophils Relative: 1 %
HCT: 38.4 % (ref 36.0–46.0)
Hemoglobin: 11.6 g/dL — ABNORMAL LOW (ref 12.0–15.0)
Immature Granulocytes: 0 %
Lymphocytes Relative: 14 %
Lymphs Abs: 0.9 10*3/uL (ref 0.7–4.0)
MCH: 25.7 pg — ABNORMAL LOW (ref 26.0–34.0)
MCHC: 30.2 g/dL (ref 30.0–36.0)
MCV: 85.1 fL (ref 80.0–100.0)
Monocytes Absolute: 0.5 10*3/uL (ref 0.1–1.0)
Monocytes Relative: 7 %
Neutro Abs: 5.2 10*3/uL (ref 1.7–7.7)
Neutrophils Relative %: 77 %
Platelets: 498 10*3/uL — ABNORMAL HIGH (ref 150–400)
RBC: 4.51 MIL/uL (ref 3.87–5.11)
RDW: 15.8 % — ABNORMAL HIGH (ref 11.5–15.5)
WBC: 6.6 10*3/uL (ref 4.0–10.5)
nRBC: 0 % (ref 0.0–0.2)

## 2024-06-01 LAB — RESPIRATORY PANEL BY PCR

## 2024-06-01 LAB — COMPREHENSIVE METABOLIC PANEL WITH GFR
ALT: 14 U/L (ref 0–44)
AST: 15 U/L (ref 15–41)
Albumin: 3.6 g/dL (ref 3.5–5.0)
Alkaline Phosphatase: 99 U/L (ref 38–126)
Anion gap: 15 (ref 5–15)
BUN: 20 mg/dL (ref 8–23)
CO2: 22 mmol/L (ref 22–32)
Calcium: 10.7 mg/dL — ABNORMAL HIGH (ref 8.9–10.3)
Chloride: 102 mmol/L (ref 98–111)
Creatinine, Ser: 1.18 mg/dL — ABNORMAL HIGH (ref 0.44–1.00)
GFR, Estimated: 53 mL/min — ABNORMAL LOW (ref >60)
Glucose, Bld: 135 mg/dL — ABNORMAL HIGH (ref 70–99)
Potassium: 4.4 mmol/L (ref 3.5–5.1)
Sodium: 139 mmol/L (ref 135–145)
Total Bilirubin: 0.7 mg/dL (ref 0.0–1.2)
Total Protein: 8.2 g/dL — ABNORMAL HIGH (ref 6.5–8.1)

## 2024-06-01 LAB — I-STAT CG4 LACTIC ACID, ED: Lactic Acid, Venous: 0.9 mmol/L (ref 0.5–1.9)

## 2024-06-01 MED ORDER — BUDESONIDE 0.5 MG/2ML IN SUSP
2.0000 mg | Freq: Two times a day (BID) | RESPIRATORY_TRACT | Status: DC
  Administered 2024-06-01: 0.25 mg via RESPIRATORY_TRACT
  Administered 2024-06-02 – 2024-06-03 (×3): 2 mg via RESPIRATORY_TRACT
  Administered 2024-06-03: 0.5 mg via RESPIRATORY_TRACT
  Administered 2024-06-04 (×2): 2 mg via RESPIRATORY_TRACT
  Filled 2024-06-01 (×6): qty 8

## 2024-06-01 MED ORDER — ENOXAPARIN SODIUM 60 MG/0.6ML IJ SOSY
1.0000 mg/kg | PREFILLED_SYRINGE | Freq: Two times a day (BID) | INTRAMUSCULAR | Status: DC
  Administered 2024-06-01 – 2024-06-04 (×6): 45 mg via SUBCUTANEOUS
  Filled 2024-06-01 (×6): qty 0.6

## 2024-06-01 MED ORDER — SODIUM CHLORIDE 0.9 % IV BOLUS
500.0000 mL | Freq: Once | INTRAVENOUS | Status: AC
  Administered 2024-06-01: 500 mL via INTRAVENOUS

## 2024-06-01 MED ORDER — HYDROMORPHONE HCL 1 MG/ML IJ SOLN: 0.5000 mg | INTRAMUSCULAR | Status: DC | PRN

## 2024-06-01 MED ORDER — LACTATED RINGERS IV SOLN: INTRAVENOUS | Status: AC

## 2024-06-01 MED ORDER — OXYCODONE HCL 5 MG PO TABS
5.0000 mg | ORAL_TABLET | ORAL | Status: DC | PRN
  Administered 2024-06-02 – 2024-06-04 (×3): 5 mg via ORAL
  Filled 2024-06-01 (×3): qty 1

## 2024-06-01 MED ORDER — PREDNISONE 20 MG PO TABS
40.0000 mg | ORAL_TABLET | Freq: Every day | ORAL | Status: DC
  Administered 2024-06-03 – 2024-06-04 (×2): 40 mg via ORAL
  Filled 2024-06-01 (×2): qty 2

## 2024-06-01 MED ORDER — FLUOXETINE HCL 20 MG PO CAPS
20.0000 mg | ORAL_CAPSULE | Freq: Every day | ORAL | Status: DC
  Administered 2024-06-02 – 2024-06-04 (×3): 20 mg via ORAL
  Filled 2024-06-01 (×3): qty 1

## 2024-06-01 MED ORDER — FERROUS SULFATE 325 (65 FE) MG PO TABS
325.0000 mg | ORAL_TABLET | Freq: Every day | ORAL | Status: DC
  Administered 2024-06-02 – 2024-06-04 (×3): 325 mg via ORAL
  Filled 2024-06-01 (×3): qty 1

## 2024-06-01 MED ORDER — SODIUM CHLORIDE 0.9 % IV SOLN
2.0000 g | INTRAVENOUS | Status: DC
  Administered 2024-06-02 – 2024-06-03 (×2): 2 g via INTRAVENOUS
  Filled 2024-06-01 (×2): qty 20

## 2024-06-01 MED ORDER — SODIUM CHLORIDE 0.9 % IV SOLN
500.0000 mg | INTRAVENOUS | Status: DC
  Administered 2024-06-02: 500 mg via INTRAVENOUS
  Filled 2024-06-01: qty 5

## 2024-06-01 MED ORDER — ACETAMINOPHEN 325 MG PO TABS
650.0000 mg | ORAL_TABLET | Freq: Four times a day (QID) | ORAL | Status: DC | PRN
Start: 2024-06-01 — End: 2024-06-04
  Administered 2024-06-04: 650 mg via ORAL
  Filled 2024-06-01: qty 2

## 2024-06-01 MED ORDER — ALPRAZOLAM 0.5 MG PO TABS
1.0000 mg | ORAL_TABLET | Freq: Three times a day (TID) | ORAL | Status: DC | PRN
  Administered 2024-06-01 – 2024-06-04 (×7): 1 mg via ORAL
  Filled 2024-06-01: qty 2
  Filled 2024-06-01 (×3): qty 4
  Filled 2024-06-01: qty 2
  Filled 2024-06-01: qty 4
  Filled 2024-06-01: qty 2

## 2024-06-01 MED ORDER — ATORVASTATIN CALCIUM 40 MG PO TABS
40.0000 mg | ORAL_TABLET | Freq: Every day | ORAL | Status: DC
  Administered 2024-06-02 – 2024-06-04 (×3): 40 mg via ORAL
  Filled 2024-06-01 (×2): qty 1
  Filled 2024-06-01: qty 2

## 2024-06-01 MED ORDER — SODIUM CHLORIDE 0.9 % IV SOLN
500.0000 mg | Freq: Once | INTRAVENOUS | Status: AC
  Administered 2024-06-01: 500 mg via INTRAVENOUS
  Filled 2024-06-01: qty 5

## 2024-06-01 MED ORDER — SODIUM CHLORIDE 0.9 % IV SOLN
2.0000 g | Freq: Once | INTRAVENOUS | Status: AC
  Administered 2024-06-01: 2 g via INTRAVENOUS
  Filled 2024-06-01: qty 20

## 2024-06-01 MED ORDER — GUAIFENESIN ER 600 MG PO TB12
600.0000 mg | ORAL_TABLET | Freq: Two times a day (BID) | ORAL | Status: DC
  Administered 2024-06-01 – 2024-06-04 (×6): 600 mg via ORAL
  Filled 2024-06-01 (×6): qty 1

## 2024-06-01 MED ORDER — ONDANSETRON HCL 4 MG/2ML IJ SOLN
4.0000 mg | Freq: Four times a day (QID) | INTRAMUSCULAR | Status: DC | PRN
  Administered 2024-06-03 – 2024-06-04 (×2): 4 mg via INTRAVENOUS
  Filled 2024-06-01 (×2): qty 2

## 2024-06-01 MED ORDER — ONDANSETRON HCL 4 MG PO TABS
4.0000 mg | ORAL_TABLET | Freq: Four times a day (QID) | ORAL | Status: DC | PRN
Start: 2024-06-01 — End: 2024-06-04

## 2024-06-01 MED ORDER — POLYETHYLENE GLYCOL 3350 17 G PO PACK: 17.0000 g | PACK | Freq: Every day | ORAL | Status: DC | PRN

## 2024-06-01 MED ORDER — ACETAMINOPHEN 650 MG RE SUPP: 650.0000 mg | Freq: Four times a day (QID) | RECTAL | Status: DC | PRN

## 2024-06-01 MED ORDER — METHYLPREDNISOLONE SODIUM SUCC 125 MG IJ SOLR
125.0000 mg | Freq: Once | INTRAMUSCULAR | Status: AC
  Administered 2024-06-01: 125 mg via INTRAVENOUS
  Filled 2024-06-01: qty 2

## 2024-06-01 MED ORDER — ARFORMOTEROL TARTRATE 15 MCG/2ML IN NEBU
15.0000 ug | INHALATION_SOLUTION | Freq: Two times a day (BID) | RESPIRATORY_TRACT | Status: DC
  Administered 2024-06-01 – 2024-06-04 (×6): 15 ug via RESPIRATORY_TRACT
  Filled 2024-06-01 (×6): qty 2

## 2024-06-01 NOTE — H&P (Signed)
 History and Physical  Patient: Kristina Howe FMW:[id number] DOB: Apr 21, 1961 DOA: 06/01/2024 DOS: the patient was seen and examined on 06/01/2024 Patient coming from: Home  Chief Complaint:  Chief Complaint  Patient presents with   Shortness of Breath   HPI: Kristina Howe is a 63 y.o. female with PMH significant of pulmonary embolism on Lovenox , primary squamous cell carcinoma of the right lung, metastatic to brain presented to ED with shortness of breath.  Patient was recently admitted 05/17/2024-05/18/2024, was found to have pulmonary embolism, was recommended Lovenox  as she had failed oral anticoagulation.  Per patient, she is short of breath with minimal exertion, wheezing, and hypoxic.  Per patient, she feels anxious, miserable and while in lobby at the cancer center, her O2 sats dropped to 84% on room air, does not have O2 at home.  She was transported to the ED. Has nausea but no abdominal pain, vomiting, diarrhea or fevers.  Per patient, she has not been able to walk due to generalized weakness and her boyfriend has to help her in the wheelchair.   ED course:  In ED, temp 97.8 F, RR 25-38, BP 90/75, O2 sats 94% on 4 L O2 via [place] Sodium 139, potassium 4.4, BUN 20, creatinine 1.1, lactic acid 0.9 WBC 6.6, hemoglobin 11.6, platelets 498  Chest x-ray showed new opacification in the right lower chest may reflect combination of effusion and airspace disease/collapse, bulky mediastinal adenopathy  Review of Systems: As mentioned in the history of present illness. All other systems reviewed and are negative. Past Medical History:  Diagnosis Date   Anemia 04/02/2024   hgb = 9   Anxiety    tx w/ xanax  and celexa    Cancer (HCC)    Elevated cholesterol    Hypertension    Subclavian artery stenosis, right (HCC) 04/19/2024   right forearm pain;  thrombophlebitis of her superficial basilic forearm veins- instructed her to take ASA 81 mg & warm compresses   Past Surgical History:  Procedure  Laterality Date   ABDOMINAL HYSTERECTOMY     AORTOGRAM N/A 03/11/2024   Procedure: AORTIC ARCH ANGIOGRAM;  Surgeon: Pearline Norman RAMAN, MD;  Location: [facility] OR;  Service: Vascular;  Laterality: N/A;   BRONCHIAL BIOPSY  04/26/2024   Procedure: BRONCHOSCOPY, WITH BIOPSY;  Surgeon: Shelah Lamar RAMAN, MD;  Location: [facility] ENDOSCOPY;  Service: Pulmonary;;   BRONCHIAL BRUSHINGS  04/26/2024   Procedure: BRONCHOSCOPY, WITH BRUSH BIOPSY;  Surgeon: Shelah Lamar RAMAN, MD;  Location: [facility] ENDOSCOPY;  Service: Pulmonary;;   BRONCHIAL NEEDLE ASPIRATION BIOPSY  04/26/2024   Procedure: BRONCHOSCOPY, WITH NEEDLE ASPIRATION BIOPSY;  Surgeon: Shelah Lamar RAMAN, MD;  Location: [facility] ENDOSCOPY;  Service: Pulmonary;;   BRONCHIAL WASHINGS  04/26/2024   Procedure: IRRIGATION, BRONCHUS;  Surgeon: Shelah Lamar RAMAN, MD;  Location: [facility] ENDOSCOPY;  Service: Pulmonary;;   BRONCHOSCOPY, WITH BIOPSY USING ELECTROMAGNETIC NAVIGATION Right 04/26/2024   Procedure: BRONCHOSCOPY, WITH BIOPSY USING ELECTROMAGNETIC NAVIGATION;  Surgeon: Shelah Lamar RAMAN, MD;  Location: [facility] ENDOSCOPY;  Service: Pulmonary;  Laterality: Right;   COLONOSCOPY  2024   INSERTION OF RETROGRADE CAROTID STENT Right 03/11/2024   Procedure: INSERTION RIGHT SUBCLAVIAN ARTERY AND RIGHT COMMON CAROTID ARTERY STENTS;  Surgeon: Pearline Norman RAMAN, MD;  Location: [facility] OR;  Service: Vascular;  Laterality: Right;   THORACIC EXPOSURE Right 03/11/2024   Procedure: DIRECT EXPOSURE OF RIGHT COMMON CAROTID ARTERY;  Surgeon: Pearline Norman RAMAN, MD;  Location: [facility] OR;  Service: Vascular;  Laterality: Right;   ULTRASOUND GUIDANCE FOR VASCULAR ACCESS  Right 03/11/2024   Procedure: ULTRASOUND GUIDANCE, FOR VASCULAR ACCESS, RIGHT RADIAL ARTERY AND RIGHT FEMORAL ARTERY;  Surgeon: Pearline Norman RAMAN, MD;  Location: [facility] OR;  Service: Vascular;  Laterality: Right;   UPPER EXTREMITY ANGIOGRAM Right 03/11/2024   Procedure: GERALYN, RIGHT UPPER EXTREMITY;  Surgeon: Pearline Norman RAMAN, MD;  Location: [facility] OR;  Service:  Vascular;  Laterality: Right;   UPPER GI ENDOSCOPY  2022   VIDEO BRONCHOSCOPY WITH ENDOBRONCHIAL ULTRASOUND Right 04/26/2024   Procedure: BRONCHOSCOPY, WITH EBUS;  Surgeon: Shelah Lamar RAMAN, MD;  Location: [facility] ENDOSCOPY;  Service: Pulmonary;  Laterality: Right;  Possible robotic navigation as well depending on CT results   Social History:  reports that she quit smoking about 30 years ago. Her smoking use included cigarettes. She started smoking about 2 months ago. She has never been exposed to tobacco smoke. She has never used smokeless tobacco. She reports that she does not currently use alcohol. She reports that she does not use drugs. No Known Allergies No family history on file. Prior to Admission medications   Medication Sig Start Date End Date Taking? Authorizing Provider  albuterol  (VENTOLIN  HFA) 108 (90 Base) MCG/ACT inhaler Inhale 2 puffs into the lungs every 6 (six) hours as needed for wheezing or shortness of breath. Patient taking differently: Inhale 2 puffs into the lungs every 4 (four) hours as needed for wheezing or shortness of breath. 05/18/24  Yes Raenelle Coria, MD  ALPRAZolam  (XANAX ) 1 MG tablet Take 1 tablet (1 mg total) by mouth 3 (three) times daily as needed for anxiety. Patient taking differently: Take 1 mg by mouth 3 (three) times daily. 05/24/24  Yes Pollina, Lonni PARAS, MD  atorvastatin  (LIPITOR) 40 MG tablet Take 1 tablet (40 mg total) by mouth daily. 03/12/24  Yes Lue Elsie BROCKS, MD  ferrous sulfate 325 (65 FE) MG tablet Take 325 mg by mouth daily with breakfast.   Yes [provider]  FLUoxetine  (PROZAC ) 20 MG capsule Take 20 mg by mouth daily.   Yes [provider]  magnesium  30 MG tablet Take 60 mg by mouth at bedtime.   Yes [provider]  Multiple Vitamins-Minerals (MULTIVITAMIN WITH MINERALS) tablet Take 1 tablet by mouth daily with breakfast.   Yes [provider]  triamterene -hydrochlorothiazide  (MAXZIDE -25) 37.5-25 MG  per tablet Take 1 tablet by mouth daily.     Yes [provider]  TYLENOL  325 MG tablet Take 325-650 mg by mouth every 6 (six) hours as needed for mild pain (pain score 1-3) or headache.   Yes [provider]  valsartan (DIOVAN) 320 MG tablet Take 320 mg by mouth daily. 12/31/23  Yes [provider]  enoxaparin  (LOVENOX ) 60 MG/0.6ML injection Inject 0.5 mLs (50 mg total) into the skin every 12 (twelve) hours. 05/18/24   Raenelle Coria, MD  methylPREDNISolone  (MEDROL  DOSEPAK) 4 MG TBPK tablet Use as directed Patient not taking: Reported on 06/01/2024 05/20/24   Harris, Abigail, PA-C  oxyCODONE  (OXY IR/ROXICODONE ) 5 MG immediate release tablet Take 1 tablet (5 mg total) by mouth every 6 (six) hours as needed for severe pain (pain score 7-10). Patient not taking: Reported on 06/01/2024 05/18/24   Raenelle Coria, MD   Physical Exam: Vitals:   06/01/24 1520 06/01/24 1523 06/01/24 1626 06/01/24 1700  BP:  90/75  100/75  Pulse:  89  87  Resp:  (!) 38  (!) 32  Temp:   97.8 F (36.6 C)   TempSrc:   Oral   SpO2: 91%  94%  98%  Weight: 43.8 kg     Height: 5' 7 (1.702 m)        General: Alert, awake, oriented x3, NAD, anxious, ill-appearing, short of breath but able to complete full sentences Eyes: pink conjunctiva, anicteric sclera, PERLA HEENT: normocephalic, atraumatic, oropharynx clear, dry mucosal membranes Neck: supple, no masses or lymphadenopathy, no JVD CVS: Regular rate and rhythm, no murmurs, rubs or gallops. Resp : Diminished breath sounds at the bases, tachypneic, mild expiratory wheezing GI : Soft, nontender, nondistended, positive bowel sounds. No hepatomegaly.  Ext: No lower extremity edema  Musculoskeletal: No clubbing or cyanosis, positive pedal pulses. No contracture. ROM intact  Neuro: Grossly intact, no focal neurological deficits, strength 5/5 upper and lower extremities bilaterally Psych: alert and oriented x 3, normal mood and affect Skin: no rashes  or lesions, warm and dry   Data Reviewed: I have reviewed ED notes, Vitals, Lab results and outpatient records.   Recent Labs  Lab 06/01/24 1523  NA 139  K 4.4  CL 102  CO2 22  GLUCOSE 135*  BUN 20  CREATININE 1.18*  CALCIUM  10.7*   Recent Labs  Lab 06/01/24 1523  WBC 6.6  NEUTROABS 5.2  HGB 11.6*  HCT 38.4  MCV 85.1  PLT 498*    Assessment and Plan Principal Problem:   Acute respiratory failure with hypoxia (HCC), right lung pneumonia -Multifactorial due to right lung pneumonia, lung cancer and pulmonary embolism, FTT, COPD  - Placed on scheduled nebs, Pulmicort, Brovana, IV Solu-Medrol , flutter valve -Wean O2 as tolerated - Placed on IV Zithromax, Rocephin  - obtain blood cultures, urine strep antigen, urine Legionella antigen, sputum cultures - Will likely need home O2   Metastatic non-small cell lung cancer with mets to brain, bone -Followed by oncology and radiation oncology outpatient - Patient recently had a PET scan done on 05/21/2024 which showed extensive hypermetabolic nodal enlargement throughout the mediastinum, hilum, thoracic inlet, right supraclavicular and left chest wall, small focus of increased uptake in the left iliac bone, subtle isolated bony metastasis in the differential. 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 Consults: Palliative medicine, oncology, radiation oncology Family Communication: No family member at the bedside Severity of Illness:      The appropriate patient status for this patient is INPATIENT. Inpatient status is judged to be reasonable and necessary in order to provide the required intensity of service to ensure the patient's safety. The patient's presenting symptoms, physical exam findings, and initial radiographic and laboratory data in the context of their chronic comorbidities is felt to place them at high risk for further clinical deterioration. Furthermore, it is not anticipated that the patient will be medically stable for discharge from the hospital within 2 midnights of admission.   * I certify that at the  point of admission it is my clinical judgment that the patient will require inpatient hospital care spanning beyond 2 midnights from the point of admission due to high intensity of service, high risk for further deterioration and high frequency of surveillance required.*    Author: Nydia Distance, MD 06/01/2024 6:13 PM For on call review [email or web address].

## 2024-06-01 NOTE — ED Notes (Signed)
 Pt. Placed on via [place] 4L went from 73% to 97%

## 2024-06-01 NOTE — ED Triage Notes (Signed)
 Patient to ED from cancer center for low Sats states while in lobby patient O2 dropped to 84% RA, she does not use oxygen at home was not seen at cancer center was transported to ED.

## 2024-06-01 NOTE — ED Provider Notes (Signed)
 [place] EMERGENCY DEPARTMENT AT [facility] Provider Note   CSN: [id number] Arrival date & time: 06/01/24  1512     Patient presents with: Shortness of Breath   Kristina Howe is a 63 y.o. female.    Shortness of Breath For metastatic lung cancer.  History of pulmonary embolisms.  Has been more short of breath.  Recently seen in the ER9 days ago.  Has pulmonary embolisms that potentially slightly worse.  States they switch from the pill to the shots for this.  Has been more fatigued.  Less oral intake.  More short of breath.  Sats to 84 upon getting to waiting room at cancer center today.  Reportedly much weaker.  Reportedly not able to walk.  No fevers.    Past Medical History:  Diagnosis Date   Anemia 04/02/2024   hgb = 9   Anxiety    tx w/ xanax  and celexa    Cancer (HCC)    Elevated cholesterol    Hypertension    Subclavian artery stenosis, right (HCC) 04/19/2024   right forearm pain;  thrombophlebitis of her superficial basilic forearm veins- instructed her to take ASA 81 mg & warm compresses    Prior to Admission medications   Medication Sig Start Date End Date Taking? Authorizing Provider  albuterol  (VENTOLIN  HFA) 108 (90 Base) MCG/ACT inhaler Inhale 2 puffs into the lungs every 6 (six) hours as needed for wheezing or shortness of breath. 05/18/24   Raenelle Coria, MD  ALPRAZolam  (XANAX ) 1 MG tablet Take 1 tablet (1 mg total) by mouth 3 (three) times daily as needed for anxiety. 05/24/24   Haze Lonni PARAS, MD  atorvastatin  (LIPITOR) 40 MG tablet Take 1 tablet (40 mg total) by mouth daily. 03/12/24   Lue Elsie BROCKS, MD  enoxaparin  (LOVENOX ) 60 MG/0.6ML injection Inject 0.5 mLs (50 mg total) into the skin every 12 (twelve) hours. 05/18/24   Raenelle Coria, MD  FLUoxetine  (PROZAC ) 20 MG capsule Take 20 mg by mouth daily.    [provider]  magnesium  30 MG tablet Take 30 mg by mouth 2 (two) times daily.    [provider]   methylPREDNISolone  (MEDROL  DOSEPAK) 4 MG TBPK tablet Use as directed 05/20/24   Harris, Abigail, PA-C  Multiple Vitamins-Minerals (MULTIVITAMIN WITH MINERALS) tablet Take 1 tablet by mouth daily.    [provider]  oxyCODONE  (OXY IR/ROXICODONE ) 5 MG immediate release tablet Take 1 tablet (5 mg total) by mouth every 6 (six) hours as needed for severe pain (pain score 7-10). 05/18/24   Ghimire, Kuber, MD  PERMETHRIN EX PORTABLE OXYGEN TANK for Dx: HYPOXIA 05/19/24   [provider]  triamterene -hydrochlorothiazide  (MAXZIDE -25) 37.5-25 MG per tablet Take 1 tablet by mouth daily.      [provider]  valsartan (DIOVAN) 320 MG tablet Take 320 mg by mouth daily. 12/31/23   [provider]    Allergies: Patient has no known allergies.    Review of Systems  Respiratory:  Positive for shortness of breath.     Updated Vital Signs BP 100/75   Pulse 87   Temp 97.8 F (36.6 C) (Oral)   Resp (!) 32   Ht 5' 7 (1.702 m)   Wt 43.8 kg   SpO2 98%   BMI 15.12 kg/m   Physical Exam Vitals and nursing note reviewed.  HENT:     Head: Normocephalic.   Cardiovascular:     Rate and Rhythm: Regular rhythm.  Pulmonary:  Effort: Tachypnea present.     Breath sounds: No wheezing, rhonchi or rales.  Chest:     Chest wall: No tenderness.   Musculoskeletal:     Right lower leg: No edema.     Left lower leg: No edema.   Skin:    General: Skin is warm.   Neurological:     Mental Status: She is alert.     (all labs ordered are listed, but only abnormal results are displayed) Labs Reviewed  COMPREHENSIVE METABOLIC PANEL WITH GFR - Abnormal; Notable for the following components:      Result Value   Glucose, Bld 135 (*)    Creatinine, Ser 1.18 (*)    Calcium  10.7 (*)    Total Protein 8.2 (*)    GFR, Estimated 53 (*)    All other components within normal limits  CBC WITH DIFFERENTIAL/PLATELET - Abnormal; Notable for the following components:   Hemoglobin  11.6 (*)    MCH 25.7 (*)    RDW 15.8 (*)    Platelets 498 (*)    All other components within normal limits  CULTURE, BLOOD (ROUTINE X 2)  CULTURE, BLOOD (ROUTINE X 2)  I-STAT CG4 LACTIC ACID, ED    EKG: EKG Interpretation Date/Time:  Tuesday June 01 2024 15:24:41 EDT Ventricular Rate:  89 PR Interval:  162 QRS Duration:  80 QT Interval:  384 QTC Calculation: 468 R Axis:   -65  Text Interpretation: Sinus rhythm Left anterior fascicular block Abnormal R-wave progression, late transition Consider left ventricular hypertrophy Nonspecific T abnormalities, lateral leads Baseline wander in lead(s) V4 Confirmed by Patsey Lot [phone]) on 06/01/2024 3:34:31 PM  Radiology: [facility] Chest Portable 1 View Result Date: 06/01/2024 CLINICAL DATA:  Shortness of breath EXAM: PORTABLE CHEST 1 VIEW COMPARISON:  05/23/2024 FINDINGS: Opacity in the right lower hemithorax could reflect a combination of airspace disease and effusion. Left lung clear. Heart is normal size. Again noted is bulky mediastinal adenopathy as seen on prior CT. No acute bony abnormality. IMPRESSION: New opacification in the right lower chest may reflect a combination of effusion and airspace disease/collapse. Bulky mediastinal adenopathy. Electronically Signed   By: Franky Crease M.D.   On: 06/01/2024 16:17     Procedures   Medications Ordered in the ED  cefTRIAXone (ROCEPHIN) 2 g in sodium chloride  0.9 % 100 mL IVPB (2 g Intravenous New Bag/Given 06/01/24 1728)  azithromycin (ZITHROMAX) 500 mg in sodium chloride  0.9 % 250 mL IVPB (500 mg Intravenous New Bag/Given 06/01/24 1725)  sodium chloride  0.9 % bolus 500 mL (0 mLs Intravenous Stopped 06/01/24 1722)                                    Medical Decision Making Amount and/or Complexity of Data Reviewed Labs: ordered. Radiology: ordered.  Risk Decision regarding hospitalization.   Patient with shortness of breath.  Tachypnea.  History of known pulmonary embolisms.  On  anticoagulation.  More short of breath.  Also mild hypotension has not been eating or drinking.  Doubt sepsis.  Not on chemotherapy.  Reviewed oncology note.  And likely require admission to the hospital.     White count reassuring.  Afebrile.  However x-ray does show potential pneumonia.  Has a cough with some white sputum.  Will give antibiotics this point.  Will now add blood cultures and lactic acid otherwise think hypotension is more likely related to dehydration and  decreased oral intake.  Think sepsis is less likely.  Will give another fluid boluses continues to lower blood pressure.  Blood pressure improved.  Normal lactic acid.  Doubt severe sepsis.  CRITICAL CARE Performed by: [place] Total critical care time: 30 minutes Critical care time was exclusive of separately billable procedures and treating other patients. Critical care was necessary to treat or prevent imminent or life-threatening deterioration. Critical care was time spent personally by me on the following activities: development of treatment plan with patient and/or surrogate as well as nursing, discussions with consultants, evaluation of patient's response to treatment, examination of patient, obtaining history from patient or surrogate, ordering and performing treatments and interventions, ordering and review of laboratory studies, ordering and review of radiographic studies, pulse oximetry and re-evaluation of patient's condition.        Final diagnoses:  Hypoxia  Metastatic malignant neoplasm, unspecified site [facility])    ED Discharge Orders     None          River [place], MD 06/01/24 1732

## 2024-06-01 NOTE — Telephone Encounter (Signed)
 S- Patient presented to cancer center lobby and said  I 'm dying , I'm dying. B-Pt came to cancer center today for her 2nd appt for evaluation , scan results and treatment of her lung cancer. A- Patient is hypoxic with oxygen saturation at 84% on room air. She does not have home oxygen. She reports cough and shortness of breath (SOB). R-Patient transported by wheelchair to ED for higher level of care and immediate assessment by medical providers.

## 2024-06-01 NOTE — ED Notes (Signed)
 ED TO INPATIENT HANDOFF REPORT  ED Nurse Name and Phone #: Olen, RN [phone]  S Name/Age/Gender Kristina Howe 63 y.o. female Room/Bed: WA15/WA15  Code Status   Code Status: Prior  Home/SNF/Other Home Patient oriented to: self, place, time, and situation Is this baseline? Yes   Triage Complete: Triage complete  Chief Complaint Acute respiratory failure with hypoxia (HCC) [J96.01]  Triage Note Patient to ED from cancer center for low Sats states while in lobby patient O2 dropped to 84% RA, she does not use oxygen at home was not seen at cancer center was transported to ED.    Allergies No Known Allergies  Level of Care/Admitting Diagnosis ED Disposition     ED Disposition  Admit   Condition  --   Comment  Hospital Area: [facility] [[id number]]  Level of Care: Progressive [102]  Admit to Progressive based on following criteria: RESPIRATORY PROBLEMS hypoxemic/hypercapnic respiratory failure that is responsive to NIPPV (BiPAP) or High Flow Nasal Cannula (6-80 lpm). Frequent assessment/intervention, no > Q2 hrs < Q4 hrs, to maintain oxygenation and pulmonary hygiene.  May admit patient to [facility] or [facility] if equivalent level of care is available:: Yes  Covid Evaluation: Asymptomatic - no recent exposure (last 10 days) testing not required  Diagnosis: Acute respiratory failure with hypoxia [facility]) [327266]  Admitting Physician: DAVIA NYDIA POUR [[id number]]  Attending Physician: RAI, RIPUDEEP K [[id number]]  Certification:: I certify this patient will need inpatient services for at least 2 midnights  Expected Medical Readiness: 06/03/2024          B Medical/Surgery History Past Medical History:  Diagnosis Date   Anemia 04/02/2024   hgb = 9   Anxiety    tx w/ xanax  and celexa    Cancer (HCC)    Elevated cholesterol    Hypertension    Subclavian artery stenosis, right (HCC) 04/19/2024   right forearm pain;  thrombophlebitis of her superficial  basilic forearm veins- instructed her to take ASA 81 mg & warm compresses   Past Surgical History:  Procedure Laterality Date   ABDOMINAL HYSTERECTOMY     AORTOGRAM N/A 03/11/2024   Procedure: AORTIC ARCH ANGIOGRAM;  Surgeon: Pearline Norman RAMAN, MD;  Location: [facility] OR;  Service: Vascular;  Laterality: N/A;   BRONCHIAL BIOPSY  04/26/2024   Procedure: BRONCHOSCOPY, WITH BIOPSY;  Surgeon: Shelah Lamar RAMAN, MD;  Location: [facility] ENDOSCOPY;  Service: Pulmonary;;   BRONCHIAL BRUSHINGS  04/26/2024   Procedure: BRONCHOSCOPY, WITH BRUSH BIOPSY;  Surgeon: Shelah Lamar RAMAN, MD;  Location: [facility] ENDOSCOPY;  Service: Pulmonary;;   BRONCHIAL NEEDLE ASPIRATION BIOPSY  04/26/2024   Procedure: BRONCHOSCOPY, WITH NEEDLE ASPIRATION BIOPSY;  Surgeon: Shelah Lamar RAMAN, MD;  Location: [facility] ENDOSCOPY;  Service: Pulmonary;;   BRONCHIAL WASHINGS  04/26/2024   Procedure: IRRIGATION, BRONCHUS;  Surgeon: Shelah Lamar RAMAN, MD;  Location: [facility] ENDOSCOPY;  Service: Pulmonary;;   BRONCHOSCOPY, WITH BIOPSY USING ELECTROMAGNETIC NAVIGATION Right 04/26/2024   Procedure: BRONCHOSCOPY, WITH BIOPSY USING ELECTROMAGNETIC NAVIGATION;  Surgeon: Shelah Lamar RAMAN, MD;  Location: [facility] ENDOSCOPY;  Service: Pulmonary;  Laterality: Right;   COLONOSCOPY  2024   INSERTION OF RETROGRADE CAROTID STENT Right 03/11/2024   Procedure: INSERTION RIGHT SUBCLAVIAN ARTERY AND RIGHT COMMON CAROTID ARTERY STENTS;  Surgeon: Pearline Norman RAMAN, MD;  Location: [facility] OR;  Service: Vascular;  Laterality: Right;   THORACIC EXPOSURE Right 03/11/2024   Procedure: DIRECT EXPOSURE OF RIGHT COMMON CAROTID ARTERY;  Surgeon: Pearline Norman RAMAN, MD;  Location: [facility] OR;  Service:  Vascular;  Laterality: Right;   ULTRASOUND GUIDANCE FOR VASCULAR ACCESS Right 03/11/2024   Procedure: ULTRASOUND GUIDANCE, FOR VASCULAR ACCESS, RIGHT RADIAL ARTERY AND RIGHT FEMORAL ARTERY;  Surgeon: Pearline Norman RAMAN, MD;  Location: [facility] OR;  Service: Vascular;  Laterality: Right;   UPPER EXTREMITY ANGIOGRAM Right  03/11/2024   Procedure: GERALYN, RIGHT UPPER EXTREMITY;  Surgeon: Pearline Norman RAMAN, MD;  Location: [facility] OR;  Service: Vascular;  Laterality: Right;   UPPER GI ENDOSCOPY  2022   VIDEO BRONCHOSCOPY WITH ENDOBRONCHIAL ULTRASOUND Right 04/26/2024   Procedure: BRONCHOSCOPY, WITH EBUS;  Surgeon: Shelah Lamar RAMAN, MD;  Location: [facility] ENDOSCOPY;  Service: Pulmonary;  Laterality: Right;  Possible robotic navigation as well depending on CT results     A IV Location/Drains/Wounds Patient Lines/Drains/Airways Status     Active Line/Drains/Airways     Name Placement date Placement time Site Days   Peripheral IV 06/01/24 20 G 1 Right Antecubital 06/01/24  1533  Antecubital  less than 1   Peripheral IV 06/01/24 20 G 1 Anterior;Left Forearm 06/01/24  1716  Forearm  less than 1            Intake/Output Last 24 hours  Intake/Output Summary (Last 24 hours) at 06/01/2024 1750 Last data filed at 06/01/2024 1722 Gross per 24 hour  Intake 500 ml  Output --  Net 500 ml    Labs/Imaging Results for orders placed or performed during the hospital encounter of 06/01/24 (from the past 48 hours)  Comprehensive metabolic panel     Status: Abnormal   Collection Time: 06/01/24  3:23 PM  Result Value Ref Range   Sodium 139 135 - 145 mmol/L   Potassium 4.4 3.5 - 5.1 mmol/L   Chloride 102 98 - 111 mmol/L   CO2 22 22 - 32 mmol/L   Glucose, Bld 135 (H) 70 - 99 mg/dL    Comment: Glucose reference range applies only to samples taken after fasting for at least 8 hours.   BUN 20 8 - 23 mg/dL   Creatinine, Ser 8.81 (H) 0.44 - 1.00 mg/dL   Calcium  10.7 (H) 8.9 - 10.3 mg/dL   Total Protein 8.2 (H) 6.5 - 8.1 g/dL   Albumin  3.6 3.5 - 5.0 g/dL   AST 15 15 - 41 U/L   ALT 14 0 - 44 U/L   Alkaline Phosphatase 99 38 - 126 U/L   Total Bilirubin 0.7 0.0 - 1.2 mg/dL   GFR, Estimated 53 (L) >60 mL/min    Comment: (NOTE) Calculated using the CKD-EPI Creatinine Equation (2021)    Anion gap 15 5 - 15    Comment:  Performed at [facility] [address] [place], [place] [address]  CBC with Differential     Status: Abnormal   Collection Time: 06/01/24  3:23 PM  Result Value Ref Range   WBC 6.6 4.0 - 10.5 K/uL   RBC 4.51 3.87 - 5.11 MIL/uL   Hemoglobin 11.6 (L) 12.0 - 15.0 g/dL   HCT 61.5 63.9 - 53.9 %   MCV 85.1 80.0 - 100.0 fL   MCH 25.7 (L) 26.0 - 34.0 pg   MCHC 30.2 30.0 - 36.0 g/dL   RDW 84.1 (H) 88.4 - 84.4 %   Platelets 498 (H) 150 - 400 K/uL   nRBC 0.0 0.0 - 0.2 %   Neutrophils Relative % 77 %   Neutro Abs 5.2 1.7 - 7.7 K/uL   Lymphocytes Relative 14 %   Lymphs Abs 0.9 0.7 -  4.0 K/uL   Monocytes Relative 7 %   Monocytes Absolute 0.5 0.1 - 1.0 K/uL   Eosinophils Relative 1 %   Eosinophils Absolute 0.0 0.0 - 0.5 K/uL   Basophils Relative 1 %   Basophils Absolute 0.1 0.0 - 0.1 K/uL   Immature Granulocytes 0 %   Abs Immature Granulocytes 0.02 0.00 - 0.07 K/uL    Comment: Performed at [facility], [address], [place], [place] [address]  I-Stat Lactic Acid     Status: None   Collection Time: 06/01/24  5:24 PM  Result Value Ref Range   Lactic Acid, Venous 0.9 0.5 - 1.9 mmol/L   DG Chest Portable 1 View Result Date: 06/01/2024 CLINICAL DATA:  Shortness of breath EXAM: PORTABLE CHEST 1 VIEW COMPARISON:  05/23/2024 FINDINGS: Opacity in the right lower hemithorax could reflect a combination of airspace disease and effusion. Left lung clear. Heart is normal size. Again noted is bulky mediastinal adenopathy as seen on prior CT. No acute bony abnormality. IMPRESSION: New opacification in the right lower chest may reflect a combination of effusion and airspace disease/collapse. Bulky mediastinal adenopathy. Electronically Signed   By: Franky Crease M.D.   On: 06/01/2024 16:17    Pending Labs Unresulted Labs (From admission, onward)     Start     Ordered   06/01/24 1655  Culture, blood (routine x 2)  BLOOD CULTURE X 2,   R (with STAT occurrences)       06/01/24 1655            Vitals/Pain Today's Vitals   06/01/24 1520 06/01/24 1523 06/01/24 1626 06/01/24 1700  BP:  90/75  100/75  Pulse:  89  87  Resp:  (!) 38  (!) 32  Temp:   97.8 F (36.6 C)   TempSrc:   Oral   SpO2: 91% 94%  98%  Weight: 43.8 kg     Height: 5' 7 (1.702 m)     PainSc: 0-No pain       Isolation Precautions No active isolations  Medications Medications  cefTRIAXone (ROCEPHIN) 2 g in sodium chloride  0.9 % 100 mL IVPB (2 g Intravenous New Bag/Given 06/01/24 1728)  azithromycin (ZITHROMAX) 500 mg in sodium chloride  0.9 % 250 mL IVPB (500 mg Intravenous New Bag/Given 06/01/24 1725)  sodium chloride  0.9 % bolus 500 mL (0 mLs Intravenous Stopped 06/01/24 1722)    Mobility walks     Focused Assessments Neuro Assessment Handoff:  Swallow screen pass? Yes          Neuro Assessment:   Neuro Checks:      Has TPA been given? No If patient is a Neuro Trauma and patient is going to OR before floor call report to 4N Charge nurse: [phone] or [phone]   R Recommendations: See Admitting Provider Note  Report given to:   Additional Notes: O2 is at 4L right now via [place]

## 2024-06-01 NOTE — Telephone Encounter (Signed)
 Spoke with pt she has reached out to her PCP to see if she can get this going I did inform her we would need a OV and qualifying walk test if we were to order

## 2024-06-01 NOTE — ED Notes (Signed)
 RN called 4E and s/w nurse informing her patient is about to be transported, clear understanding voiced by nurse

## 2024-06-02 ENCOUNTER — Encounter (HOSPITAL_COMMUNITY): Payer: Self-pay | Admitting: Internal Medicine

## 2024-06-02 ENCOUNTER — Other Ambulatory Visit: Payer: Self-pay

## 2024-06-02 ENCOUNTER — Ambulatory Visit
Admission: RE | Admit: 2024-06-02 | Discharge: 2024-06-02 | Disposition: A | Discharge status: Home / Self Care | Source: Ambulatory Visit | Attending: Radiation Oncology | Admitting: Radiation Oncology

## 2024-06-02 DIAGNOSIS — C799 Secondary malignant neoplasm of unspecified site: Secondary | ICD-10-CM | POA: Diagnosis not present

## 2024-06-02 DIAGNOSIS — R0902 Hypoxemia: Secondary | ICD-10-CM | POA: Diagnosis not present

## 2024-06-02 DIAGNOSIS — J9601 Acute respiratory failure with hypoxia: Secondary | ICD-10-CM | POA: Diagnosis not present

## 2024-06-02 LAB — CBC
HCT: 31.2 % — ABNORMAL LOW (ref 36.0–46.0)
Hemoglobin: 9.3 g/dL — ABNORMAL LOW (ref 12.0–15.0)
MCH: 26.1 pg (ref 26.0–34.0)
MCHC: 29.8 g/dL — ABNORMAL LOW (ref 30.0–36.0)
MCV: 87.4 fL (ref 80.0–100.0)
Platelets: 394 10*3/uL (ref 150–400)
RBC: 3.57 MIL/uL — ABNORMAL LOW (ref 3.87–5.11)
RDW: 15.5 % (ref 11.5–15.5)
WBC: 4.2 10*3/uL (ref 4.0–10.5)
nRBC: 0 % (ref 0.0–0.2)

## 2024-06-02 LAB — RAD ONC ARIA SESSION SUMMARY
Course Elapsed Days: 0
Plan Fractions Treated to Date: 1
Plan Prescribed Dose Per Fraction: 9 Gy
Plan Total Fractions Prescribed: 3
Plan Total Prescribed Dose: 27 Gy
Reference Point Dosage Given to Date: 9 Gy
Reference Point Session Dosage Given: 9 Gy
Session Number: 1

## 2024-06-02 LAB — STREP PNEUMONIAE URINARY ANTIGEN: Strep Pneumo Urinary Antigen: NEGATIVE

## 2024-06-02 LAB — COMPREHENSIVE METABOLIC PANEL WITH GFR
ALT: 12 U/L (ref 0–44)
AST: 12 U/L — ABNORMAL LOW (ref 15–41)
Albumin: 2.8 g/dL — ABNORMAL LOW (ref 3.5–5.0)
Alkaline Phosphatase: 76 U/L (ref 38–126)
Anion gap: 10 (ref 5–15)
BUN: 21 mg/dL (ref 8–23)
CO2: 22 mmol/L (ref 22–32)
Calcium: 9.1 mg/dL (ref 8.9–10.3)
Chloride: 101 mmol/L (ref 98–111)
Creatinine, Ser: 0.71 mg/dL (ref 0.44–1.00)
GFR, Estimated: >60 mL/min (ref >60)
Glucose, Bld: 124 mg/dL — ABNORMAL HIGH (ref 70–99)
Potassium: 4.4 mmol/L (ref 3.5–5.1)
Sodium: 133 mmol/L — ABNORMAL LOW (ref 135–145)
Total Bilirubin: 0.5 mg/dL (ref 0.0–1.2)
Total Protein: 6.9 g/dL (ref 6.5–8.1)

## 2024-06-02 MED ORDER — ORAL CARE MOUTH RINSE: 15.0000 mL | OROMUCOSAL | Status: DC | PRN

## 2024-06-02 MED ORDER — CLONAZEPAM 0.5 MG PO TABS
0.5000 mg | ORAL_TABLET | Freq: Two times a day (BID) | ORAL | Status: DC
  Administered 2024-06-02 – 2024-06-04 (×5): 0.5 mg via ORAL
  Filled 2024-06-02 (×5): qty 1

## 2024-06-02 MED ORDER — LORAZEPAM 2 MG/ML IJ SOLN
0.5000 mg | INTRAMUSCULAR | Status: DC | PRN
  Administered 2024-06-02 – 2024-06-04 (×3): 0.5 mg via INTRAVENOUS
  Filled 2024-06-02 (×3): qty 1

## 2024-06-02 NOTE — Hospital Course (Signed)
 63 y.o. female with PMH significant of pulmonary embolism on Lovenox , primary squamous cell carcinoma of the right lung, metastatic to brain presented to ED with shortness of breath.  Patient was recently admitted 05/17/2024-05/18/2024, was found to have pulmonary embolism, was recommended Lovenox  as she had failed oral anticoagulation.  Per patient, she is short of breath with minimal exertion, wheezing, and hypoxic.  Per patient, she feels anxious, miserable and while in lobby at the cancer center, her O2 sats dropped to 84% on room air, does not have O2 at home.  She was transported to the ED. Has nausea but no abdominal pain, vomiting, diarrhea or fevers.  Per patient, she has not been able to walk due to generalized weakness and her boyfriend has to help her in the wheelchair.

## 2024-06-02 NOTE — Consult Note (Signed)
 Consultation Note Date: 06/02/2024   Patient Name: Kristina Howe  DOB: February 16, 1961  MRN: [id number]  Age / Sex: 63 y.o., female  PCP: [name] BROCKS, FNP Referring Physician: Cindy Garnette POUR, MD  Reason for Consultation: Establishing goals of care  HPI/Patient Profile: 63 y.o. female admitted on 06/01/2024.   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 She initially presented from the cancer center to the ED on 06/02/2023 for SOB and was found to be hypoxic, with SpO2 84% on RA. She has been admitted for respiratory failure with hypoxia and right lung PNA identified on XR.   She was evaluated by Dr. Gatha for stage IV non-small cell lung with brain mets on 6/12/205 with documented plans for f/u PET scan and stereotactic radiosurgery.  PMT has been consulted for the goc discussion  Code Status: DNR, DNI  Next of Kin: Chyrl Kintup (significant other)  No ACP on file  Pain is currently being managed with Oxycodone  and dilaudid  PRN.Per [facility] patient has not used overnight.  Palliative medicine is specialized medical care for people living with serious illness. It focuses on providing relief from the symptoms and stress of a serious illness. The goal is to improve quality of life for both the patient and the family. Goals of care: Broad aims of medical therapy in relation to the patient's values and preferences. Our aim is to provide medical care aimed at enabling patients to achieve the goals that matter most to them, given the circumstances of their particular medical situation and their constraints.   Patient seen and examined.  Patient noted to to be appearing visibly anxious.  Patient hyperventilating has pursed lip is  tearful and is wringing her hands.  She states that she is declining very rapidly.  She states that her anxiety is through the roof.  Discussed with her about her current condition, discussed with her about managing her anxiety as well as provided her with information about palliative support at the cancer center-see below  NEXT OF KIN  Jeff Kintup (significant other), has 1 son who lives in [place] , has a sister who is coming in from out of town to be with her for additional support in a few days.  SUMMARY OF RECOMMENDATIONS    DNR Adjust anti anxiety regimen and monitor Recommend palliative care at cancer center Continue current mode of care.  Thank you for the consult.   Code Status/Advance Care Planning: DNR   Symptom Management:  Add Klonopin Xanax  as needed IV Ativan  as rescue for panic attack  Palliative Prophylaxis:  Delirium Protocol  Additional Recommendations (Limitations, Scope, Preferences): Full Scope Treatment  Psycho-social/Spiritual:  Desire for further Chaplaincy support:yes Additional Recommendations: Caregiving  Support/Resources  Prognosis:  Unable to determine  Discharge Planning: Home with Palliative Services      Primary Diagnoses:  Present on Admission:  Acute respiratory failure with hypoxia (HCC)   I have reviewed the medical record, interviewed the patient and family, and examined the patient. The following aspects are pertinent.  Past Medical History:  Diagnosis Date   Anemia 04/02/2024   hgb = 9   Anxiety    tx w/ xanax  and celexa    Cancer (HCC)    Elevated cholesterol    Hypertension    Subclavian artery stenosis, right (HCC) 04/19/2024   right forearm pain;  thrombophlebitis of her superficial basilic forearm veins- instructed her to take ASA 81 mg & warm compresses   Social History   Socioeconomic History   Marital status: Significant Other    Spouse name: Not on file   Number of children: Not on file   Years of  education: Not on file   Highest education level: Not on file  Occupational History   Not on file  Tobacco Use   Smoking status: Former    Types: Cigarettes    Start date: 03/18/2024    Quit date: 03/18/1994    Years since quitting: 30.2    Passive exposure: Never   Smokeless tobacco: Never   Tobacco comments:    Quit smoking 03/18/2024   Vaping Use   Vaping status: Never Used  Substance and Sexual Activity   Alcohol use: Not Currently   Drug use: No   Sexual activity: Not Currently    Birth control/protection: Surgical    Comment: Hysterectomy  Other Topics Concern   Not on file  Social History Narrative   Not on file   Social Drivers of Health   Financial Resource Strain: Not on file  Food Insecurity: No Food Insecurity (06/01/2024)   Hunger Vital Sign    Worried About Running Out of Food in the Last Year: Never true    Ran Out of Food in the Last Year: Never true  Transportation Needs: No Transportation Needs (06/01/2024)   PRAPARE - Administrator, Civil Service (Medical): No    Lack of Transportation (Non-Medical): No  Physical Activity: Not on file  Stress: Not on file  Social Connections: Not on file   History reviewed. No pertinent family history. Scheduled Meds:  arformoterol  15 mcg Nebulization BID   atorvastatin   40 mg Oral Daily   budesonide (PULMICORT) nebulizer solution  2 mg Nebulization Q12H   clonazePAM  0.5 mg Oral BID   enoxaparin   1 mg/kg Subcutaneous Q12H   ferrous sulfate  325 mg Oral Q breakfast   FLUoxetine   20 mg Oral Daily   guaiFENesin   600 mg Oral BID   [START ON 06/03/2024] predniSONE  40 mg Oral Q breakfast   Continuous Infusions:  azithromycin     cefTRIAXone (ROCEPHIN)  IV     lactated ringers  100 mL/hr at 06/02/24 0659   PRN Meds:.acetaminophen  **OR** acetaminophen , ALPRAZolam , HYDROmorphone  (DILAUDID ) injection, LORazepam , ondansetron  **OR** ondansetron  (ZOFRAN ) IV, mouth rinse, oxyCODONE , polyethylene  glycol Medications Prior to Admission:  Prior to Admission medications   Medication Sig Start Date End Date Taking? Authorizing Provider  albuterol  (VENTOLIN  HFA) 108 (90 Base) MCG/ACT inhaler Inhale 2 puffs into the lungs every 6 (six) hours as needed for wheezing or shortness of breath. Patient taking differently: Inhale 2 puffs into the lungs every 4 (four) hours as needed for wheezing or shortness of breath. 05/18/24  Yes Raenelle Coria, MD  ALPRAZolam  (XANAX ) 1 MG tablet Take 1 tablet (1 mg total) by mouth 3 (three) times  daily as needed for anxiety. Patient taking differently: Take 1 mg by mouth 3 (three) times daily. 05/24/24  Yes Pollina, Lonni PARAS, MD  atorvastatin  (LIPITOR) 40 MG tablet Take 1 tablet (40 mg total) by mouth daily. 03/12/24  Yes Lue Elsie BROCKS, MD  enoxaparin  (LOVENOX ) 60 MG/0.6ML injection Inject 60 mg into the skin every 12 (twelve) hours.   Yes [provider]  ferrous sulfate 325 (65 FE) MG tablet Take 325 mg by mouth daily with breakfast.   Yes [provider]  FLUoxetine  (PROZAC ) 20 MG capsule Take 20 mg by mouth daily.   Yes [provider]  magnesium  30 MG tablet Take 60 mg by mouth at bedtime.   Yes [provider]  Multiple Vitamins-Minerals (MULTIVITAMIN WITH MINERALS) tablet Take 1 tablet by mouth daily with breakfast.   Yes [provider]  triamterene -hydrochlorothiazide  (MAXZIDE -25) 37.5-25 MG per tablet Take 1 tablet by mouth daily.     Yes [provider]  TYLENOL  325 MG tablet Take 325-650 mg by mouth every 6 (six) hours as needed for mild pain (pain score 1-3) or headache.   Yes [provider]  valsartan (DIOVAN) 320 MG tablet Take 320 mg by mouth daily. 12/31/23  Yes [provider]  enoxaparin  (LOVENOX ) 60 MG/0.6ML injection Inject 0.5 mLs (50 mg total) into the skin every 12 (twelve) hours. Patient not taking: Reported on 06/01/2024 05/18/24   Raenelle Coria, MD   methylPREDNISolone  (MEDROL  DOSEPAK) 4 MG TBPK tablet Use as directed Patient not taking: Reported on 06/01/2024 05/20/24   Harris, Abigail, PA-C  oxyCODONE  (OXY IR/ROXICODONE ) 5 MG immediate release tablet Take 1 tablet (5 mg total) by mouth every 6 (six) hours as needed for severe pain (pain score 7-10). Patient not taking: Reported on 06/01/2024 05/18/24   Raenelle Coria, MD   No Known Allergies Review of Systems Severe anxiety Physical Exam Patient appears visibly anxious Has pursed lips Has anxious appearance, tearful hyperventilating Abdomen nondistended No edema  Vital Signs: BP 121/77 (BP Location: Left Arm)   Pulse 67   Temp (!) 97.3 F (36.3 C) (Oral)   Resp 18   Ht 5' 7 (1.702 m)   Wt 43.8 kg   SpO2 98%   BMI 15.12 kg/m  Pain Scale: 0-10   Pain Score: 0-No pain   SpO2: SpO2: 98 % O2 Device:SpO2: 98 % O2 Flow Rate: .O2 Flow Rate (L/min): 5 L/min  IO: Intake/output summary:  Intake/Output Summary (Last 24 hours) at 06/02/2024 1300 Last data filed at 06/02/2024 1152 Gross per 24 hour  Intake 2303.82 ml  Output 600 ml  Net 1703.82 ml    LBM: Last BM Date : 06/01/24 Baseline Weight: Weight: 43.8 kg Most recent weight: Weight: 43.8 kg     Palliative Assessment/Data:   Palliative performance scale 60%  Time In: 10 Time Out: 11 Time Total: 60 Greater than 50%  of this time was spent counseling and coordinating care related to the above assessment and plan.  Signed by: Lonia Serve, MD   Please contact Palliative Medicine Team phone at [phone] for questions and concerns.  For individual provider: See [place]

## 2024-06-02 NOTE — Progress Notes (Addendum)
 Kristina Howe   DOB:[birth year]   FM#:[id number]      ASSESSMENT & PLAN:  Kristina Howe is a 63 year old female patient with oncologic history significant for Non-small cell lung cancer with mets to brain, bone and nodes. Now admitted from [facility] on 6/24 due to shortness of breath. Follows with Dr. Markell Schrier/Medical Oncology.  Non-small cell lung cancer with mets to brain, bone and lymph nodes -- Recently diagnosed 04/2024 -- Pet scan 05/21/24 shows extensive nodal enlargement throughout mediastinum, hilum, thoracic inlet, and right supraclavicular and left chest wall. Small uptake in left iliac possibly bone mets.  -- CT chest 05/24/24 confirms advanced thoracic malignancy.  -- MR brain 05/26/24 shows 4 areas concerning for mets within right cerebellum in addition to highly suspicious lesion in left posterior temporal lobe.  -- Radiation Oncology following.  -- Patient was scheduled for outpatient oncology visit on 06/01/24 when she began to experience shortness of breath in the Lobby.   -- Subsequently admitted for further evaluation and management of hypoxia and shortness of breath.  -- Follows with Medical Oncology/Dr. Sherrod  Shortness of breath Anxiety  -- Likely multifactorial due to malignancy, anxiety -- Strep pneumo NEG -- Continue supplemental O2 via [place]  Anemia, normocytic -- Hemoglobin low 9.3 -- No transfusional intervention required at this time -- Continue to monitor CBC with diff  Hypertension Hyperlipidemia -- Monitor blood pressure closely -- Medicine managing    Code Status DNR-Limited  Subjective:  Patient seen awake and alert laying in bed, spouse at bedside.  Complains of ongoing shortness of breath, patient crying, states she feels anxious.  No other acute complaints offered.   Objective:   Intake/Output Summary (Last 24 hours) at 06/02/2024 1210 Last data filed at 06/02/2024 1152 Gross per 24 hour  Intake 2303.82 ml  Output 600 ml  Net 1703.82 ml      PHYSICAL EXAMINATION: ECOG PERFORMANCE STATUS: 2 - Symptomatic, <50% confined to bed  Vitals:   06/02/24 0849 06/02/24 0851  BP:    Pulse:    Resp:    Temp:    SpO2: 98% 98%   Filed Weights   06/01/24 1520  Weight: 96 lb 9 oz (43.8 kg)    GENERAL: alert, +mild distress and comfortable  +frail SKIN: +pale skin color, texture, turgor are normal, no rashes or significant lesions EYES: normal, conjunctiva are pink and non-injected, sclera clear OROPHARYNX: no exudate, no erythema and lips, buccal mucosa, and tongue normal  NECK: supple, thyroid normal size, non-tender, without nodularity LYMPH: no palpable lymphadenopathy in the cervical, axillary or inguinal LUNGS: +coarse to auscultation and percussion with normal breathing effort HEART: regular rate & rhythm and no murmurs and no lower extremity edema ABDOMEN: abdomen soft, non-tender and normal bowel sounds MUSCULOSKELETAL: no cyanosis of digits and no clubbing  PSYCH: alert & oriented x 3 with fluent speech NEURO: no focal motor/sensory deficits   All questions were answered. The patient knows to call the clinic with any problems, questions or concerns.   The total time spent in the appointment was 40 minutes encounter with patient including review of chart and various tests results, discussions about plan of care and coordination of care plan  Olam JINNY Brunner, NP 06/02/2024 12:10 PM    Labs Reviewed:  Lab Results  Component Value Date   WBC 4.2 06/02/2024   HGB 9.3 (L) 06/02/2024   HCT 31.2 (L) 06/02/2024   MCV 87.4 06/02/2024   PLT 394 06/02/2024   Recent Labs  05/20/24 1650 05/23/24 2221 06/01/24 1523 06/02/24 0502  NA 131* 136 139 133*  K 3.3* 3.7 4.4 4.4  CL 98 101 102 101  CO2 18* 19* 22 22  GLUCOSE 172* 98 135* 124*  BUN 12 19 20 21   CREATININE 0.78 0.83 1.18* 0.71  CALCIUM  10.3 10.6* 10.7* 9.1  GFRNONAA >60 >60 53* >60  PROT 7.8  --  8.2* 6.9  ALBUMIN  3.6  --  3.6 2.8*  AST 23  --  15  12*  ALT 10  --  14 12  ALKPHOS 78  --  99 76  BILITOT 0.9  --  0.7 0.5    Studies Reviewed:  DG Chest Portable 1 View Result Date: 06/01/2024 CLINICAL DATA:  Shortness of breath EXAM: PORTABLE CHEST 1 VIEW COMPARISON:  05/23/2024 FINDINGS: Opacity in the right lower hemithorax could reflect a combination of airspace disease and effusion. Left lung clear. Heart is normal size. Again noted is bulky mediastinal adenopathy as seen on prior CT. No acute bony abnormality. IMPRESSION: New opacification in the right lower chest may reflect a combination of effusion and airspace disease/collapse. Bulky mediastinal adenopathy. Electronically Signed   By: Franky Crease M.D.   On: 06/01/2024 16:17   MR Brain W Wo Contrast Result Date: 05/26/2024 CLINICAL DATA:  Metastatic disease evaluation 3T SRS Protocol for radiation treatment planning EXAM: MRI HEAD WITHOUT AND WITH CONTRAST TECHNIQUE: Multiplanar, multiecho pulse sequences of the brain and surrounding structures were obtained without and with intravenous contrast. CONTRAST:  5mL GADAVIST  GADOBUTROL  1 MMOL/ML IV SOLN COMPARISON:  MRI of the brain dated May 17, 2024. FINDINGS: Brain: An ovoid peripherally enhancing lesion is again demonstrated left posterior temporal lobe, measuring approximately 12 x 11 x 10 mm. There are additional nodular foci of enhancement present within the right cerebellar hemisphere, which are concerning for metastatic disease. The largest lesion measures approximately 3 mm in diameter and is seen on image 114 of series 1100. There are smaller focal areas of concerning enhancement present within the right cerebellum on images one hundred three, 105 and 108. There is moderate diffuse periventricular and deep cerebral white matter disease present. Vascular: Normal vascular flow voids. Skull and upper cervical spine: Normal signal intensity. Sinuses/Orbits: Negative. Other: None. IMPRESSION: 1. There are 4 areas of concerning enhancement  present within the right cerebellar hemisphere, which are suspicious for metastatic disease. These are in addition to the highly suspicious lesion present within the left posterior temporal lobe. 2. Advanced cerebral white matter disease. Electronically Signed   By: Evalene Coho M.D.   On: 05/26/2024 16:07   CT Angio Chest Pulmonary Embolism (PE) W or WO Contrast Result Date: 05/24/2024 CLINICAL DATA:  63 year old female with lung cancer. Pulmonary emboli, predominantly nonocclusive, affecting bilateral pulmonary arteries on CTA last week. Near syncope. * Tracking Code: BO * EXAM: CT ANGIOGRAPHY CHEST WITH CONTRAST TECHNIQUE: Multidetector CT imaging of the chest was performed using the standard protocol during bolus administration of intravenous contrast. Multiplanar CT image reconstructions and MIPs were obtained to evaluate the vascular anatomy. RADIATION DOSE REDUCTION: This exam was performed according to the departmental dose-optimization program which includes automated exposure control, adjustment of the mA and/or kV according to patient size and/or use of iterative reconstruction technique. CONTRAST:  75mL OMNIPAQUE  IOHEXOL  350 MG/ML SOLN COMPARISON:  CTA chest 05/17/2024, PET-CT 05/21/2024, and earlier. FINDINGS: Cardiovascular: Excellent contrast bolus timing in the pulmonary arterial tree. Mass effect on the central pulmonary arteries related to bulky mediastinal tumor. Stable  to slightly smaller nonocclusive thrombi in left upper lobe branches seen on series 7, images 134, 146. No saddle or lobar embolus. Stable to smaller contralateral right lower lobe nonocclusive PE on series 7, image 175. Stable more occlusive appearing right lower lobe clot image 203. Stable nonocclusive thrombus in branches of the right upper lobe visible on images 120 and 124. But in lateral segment right middle lobe branches there is progressed segmental thrombus series 7, images 179 and 188. Nearby medial segmental clot  appears stable on image 189. Heart size remains normal. No pericardial effusion. Calcified aortic atherosclerosis. Proximal great vessels appear to remain patent, the proximal right CCA and subclavian arteries are stented as before. Mediastinum/Nodes: Bulky diffuse mediastinal lobulated tumor, bilateral mediastinal lymphadenopathy. Not significantly changed from the CTA last week or the recent PET-CT. Lungs/Pleura: Mediastinal tumor nearly occludes the bronchus intermedius on series 6, image 60, unchanged from recent exams and distal right middle and lower lobe airway ventilation has mildly improved since 05/17/2024, with less retained secretions. No new airway stenosis. Background centrilobular emphysema in both lungs. Stable ventilation since 05/17/2024. No pneumothorax or pleural effusion. Right lower lobe peribronchial 11 mm lung nodule/mass redemonstrated on series 6, image 91. Linear left upper lobe probable lung scarring on image 24. Mild scarring versus nodularity in the lateral right upper lobe is stable on image 57. More curvilinear bilateral costophrenic angle scarring is stable. Upper Abdomen: Abdominal Calcified aortic atherosclerosis. Negative partially visible a centrally noncontrast liver, spleen, adrenal glands, left kidney, and bowel in the upper abdomen. Musculoskeletal: Stable visualized osseous structures. T10 congenital butterfly vertebra, normal variant. Degenerative appearing L1 superior endplate Schmorl's node is stable. No acute or destructive osseous lesion identified in the chest. Review of the MIP images confirms the above findings. IMPRESSION: 1. Advanced Thoracic Malignancy. Bulky mediastinal tumor invading and nearly occluding the right bronchus intermedius, bilateral hilar lymphadenopathy not significantly changed from PET-CT 3 days ago. 2. Generally stable pulmonary artery branch PE since the CTA on 05/17/2024. However, there is increased more occlusive appearing segmental pulmonary  embolus in the lateral right middle lobe segmental branches. But most of the other pulmonary artery branch involvement is nonocclusive, and there is no lobar or saddle embolus. 3. Stable Emphysema (ICD10-J43.9). Stable bilateral lung ventilation with several lung nodules which were evaluated on recent PET. 4.  Aortic Atherosclerosis (ICD10-I70.0). Electronically Signed   By: VEAR Hurst M.D.   On: 05/24/2024 05:16   DG Chest Portable 1 View Result Date: 05/23/2024 CLINICAL DATA:  Syncope Pt reports that she feels like she is going to pass out x 2 days with worsening today. Pt has Stage 3 lung cancer and recently diagnosed with a PE. Pt reports chest pain and SOB, as well. EXAM: PORTABLE CHEST 1 VIEW COMPARISON:  Chest x-ray 05/20/2024, CT chest 05/17/2024 FINDINGS: The heart and mediastinal contours are unchanged with superior mediastinal irregular contour best evaluated on CT angio chest 07/17/2024. Chronic coarsened interstitial markings with no overt pulmonary edema. No focal consolidation. No pleural effusion. No pneumothorax. No acute osseous abnormality. IMPRESSION: 1. No acute cardiopulmonary disease. 2. Superior mediastinal irregular contour best evaluated on CT angio chest 07/17/2024. Electronically Signed   By: Morgane  Naveau M.D.   On: 05/23/2024 22:11   NM PET Image Initial (PI) Skull Base To Thigh (F-18 FDG) Result Date: 05/21/2024 CLINICAL DATA:  Initial treatment strategy for non-small-cell carcinoma. EXAM: NUCLEAR MEDICINE PET SKULL BASE TO THIGH TECHNIQUE: 5.61 mCi F-18 FDG was injected intravenously. Full-ring PET imaging  was performed from the skull base to thigh after the radiotracer. CT data was obtained and used for attenuation correction and anatomic localization. Fasting blood glucose: 146 mg/dl COMPARISON:  CTA chest 05/17/2024 and older FINDINGS: Mediastinal blood pool activity: SUV max 2.0 Liver activity: SUV max 2.2 NECK: Abnormal lymph nodes are identified in the low right neck,  supraclavicular region. At least 3 nodes are seen. Largest is seen with maximum SUV value of 7.6 and dimension on image 36 measuring 19 by 17 mm. No other areas of abnormal nodal uptake in the left side of the neck. There is asymmetric uptake along the right vocal cord compared to left. Please correlate for any focal cord abnormality. Incidental CT findings: Small thyroid gland. The submandibular glands and parotid glands are grossly preserved. Please see separate taken of MRI of the brain from 05/17/2024. Paranasal sinuses and mastoid air cells are clear. Is some streak artifact from the dental hardware. Scattered vascular calcifications. CHEST: Extensive hypermetabolic lymph nodes identified throughout the mediastinum. Distribution includes internal mammary chain on the right, prevascular, paratracheal, subcarinal, left thoracic inlet and chest wall and paraesophageal. Additional abnormal nodes seen in both hila. Specific examples include the large confluent mass upper right mediastinum maximum SUV of 9.1. This area in total on image 47 measures 5.1 x 4.8 cm. These and other nodes displace mediastinal structures and there is slight narrowing of the SVC along its course. Confluent area along left side of the upper mediastinum the level of the arch has maximum SUV of 7.7 and again is likely confluence of multiple nodes with the entirety measured on image 55 at 6.6 by 2.7 cm. The right hilar node has maximum SUV approaching 5.3 on image 65. Left hilar area on image 65 has maximum SUV of 6.0. There is a small nodule in the right lower lobe which is centrally cavitary on image 69 measuring 12 by 8 mm. This has only minimal uptake of maximum SUV of 1.2. No specific areas of abnormal lung uptake at this time. Please correlate history. Incidental CT findings: Advanced centrilobular emphysematous lung changes are identified. No pneumothorax or effusion. There is some linear opacity the bases likely scar or atelectasis.  There is motion. Significant areas of debris or filling defects along the bilateral bronchi centrally, right-greater-than-left, bronchus intermedius. Heart is nonenlarged. Small pericardial effusion. Scattered vascular calcifications are seen including the coronary arteries. Slightly patulous esophagus. Please correlate with the recent CT scan of the chest from 05/17/2024. Once again arterial stents identified along the great vessels, brachiocephalic artery with extension into the common and right subclavian artery. ABDOMEN/PELVIS: Physiologic distribution radiotracer along the parenchymal organs, bowel and renal collecting systems. No specific abnormal nodal uptake in the abdomen and pelvis as well. Incidental CT findings: Hyperdense left-sided lower pole renal cystic foci again seen. Please correlate with previous CT and MRI evaluations. Diffuse vascular calcifications. Some high attenuation material along the large bowel is again seen. The large bowel is nondilated with scattered stool. The stomach and small bowel are nondilated. Spleen, adrenal glands and liver are grossly preserved. Gallbladder is nondilated. SKELETON: There is a focus of increased uptake with maximum SUV of 3.7 on image 127. This is along the left iliac bone. Faint area of sclerosis identified in this location image 126 of the CT. An isolated bony metastatic focus is possible. Please correlate for symptoms and either short follow-up versus additional workup. No other areas of abnormal uptake along the skeleton Incidental CT findings: Spine.  Scattered degenerative  changes. IMPRESSION: Extensive hypermetabolic nodal enlargement throughout the mediastinum, hilum and also thoracic inlet, right supraclavicular and left chest wall. Small right-sided lower lobe lung nodule identified with no abnormal uptake. Please correlate with history. Attention on follow-up. Filling defects along the central bronchi of the lungs particularly the bronchus  intermedius. Please correlate with history. Small focus of increased uptake in the left iliac bone relative to other areas of marrow. A subtle isolated bony metastasis is in the differential. Further workup for tension on follow-up as clinically directed. Asymmetric vocal cord uptake.  Please correlate for function. Please correlate with multiple prior CT and MRI examinations including brain MRI. Electronically Signed   By: Ranell Bring M.D.   On: 05/21/2024 16:53   DG Chest Port 1 View Result Date: 05/20/2024 CLINICAL DATA:  sob EXAM: PORTABLE CHEST - 1 VIEW COMPARISON:  May 17, 2024 FINDINGS: Emphysema. No focal airspace consolidation, pleural effusion, or pneumothorax. No cardiomegaly. Unchanged thickening of the right paratracheal stripe and AP window. No acute fracture or destructive lesions. Multilevel thoracic osteophytosis. IMPRESSION: 1. Emphysema.  No acute cardiopulmonary abnormality. 2. Unchanged masslike thickening within the right paratracheal and AP window regions, correlating to the lymphadenopathy or confluent soft tissue noted on the prior CT. Electronically Signed   By: Rogelia Myers M.D.   On: 05/20/2024 17:11   MR BRAIN W WO CONTRAST Result Date: 05/17/2024 CLINICAL DATA:  Initial evaluation for metastatic disease. EXAM: MRI HEAD WITHOUT AND WITH CONTRAST TECHNIQUE: Multiplanar, multiecho pulse sequences of the brain and surrounding structures were obtained without and with intravenous contrast. CONTRAST:  5mL GADAVIST  GADOBUTROL  1 MMOL/ML IV SOLN COMPARISON:  None Available. FINDINGS: Brain: Mild age-related cerebral atrophy. Patchy T2/FLAIR hyperintensity involving the periventricular deep white matter both cerebral hemispheres, most like related chronic microvascular ischemic disease, moderately advanced in nature. No evidence for acute or subacute ischemia. No areas of chronic cortical infarction. No acute intracranial hemorrhage. 1.2 cm rim enhancing lesion positioned along the  gray-white matter differentiation of the left temporal occipital junction (series 17, image 57). Mild localized vasogenic edema without significant regional mass effect. Associated susceptibility artifact consistent with blood products and/or necrosis. Finding suspicious for a small metastatic lesion given provided history. There is question of an additional 5 mm focus of enhancement at the superior right cerebellum (series 18, image 8). Associated susceptibility artifact at this location (series 10, image 12). Finding is nonspecific, and could reflect a small additional metastatic lesion or possibly small vascular lesion. No other visible intracranial metastases. No other pathologic enhancement. Ventricles normal size without hydrocephalus. No extra-axial fluid collection. Pituitary gland and suprasellar region within normal limits. Vascular: Major intracranial vascular flow voids are maintained. Skull and upper cervical spine: Craniocervical junction within normal limits. Bone marrow signal intensity normal. No scalp soft tissue abnormality. Sinuses/Orbits: Globes and orbital soft tissues within normal limits. Paranasal sinuses are largely clear. Trace left mastoid effusion noted, of doubtful significance. Other: None. IMPRESSION: 1. 1.2 cm rim enhancing lesion positioned at the left temporoccipital junction, suspicious for a metastatic lesion given provided history. Mild localized edema without significant regional mass effect. 2. Question additional 5 mm focus of enhancement at the superior right cerebellum, nonspecific, and could reflect an additional metastasis or possibly a small vascular lesion. Attention at follow-up recommended. 3. No other acute intracranial abnormality. 4. Underlying age-related cerebral atrophy with moderately advanced chronic microvascular ischemic disease. Electronically Signed   By: Morene Hoard M.D.   On: 05/17/2024 21:08   DG Chest Portable  1 View Result Date:  05/17/2024 CLINICAL DATA:  Chest pain. EXAM: PORTABLE CHEST 1 VIEW COMPARISON:  Radiographs 04/26/2024 and 12/14/2021. Chest CT 04/21/2024. FINDINGS: 1509 hours. Previously demonstrated right paratracheal adenopathy may be slightly progressive. The heart size is stable. There are right subclavian and right common carotid vascular stents. Pulmonary nodularity seen by CT is not well visualized. The lungs appear clear. No evidence of pleural effusion or pneumothorax. The bones appear unchanged. IMPRESSION: 1. No evidence of acute cardiopulmonary process. 2. Possible slight progression of known right paratracheal adenopathy, remaining suspicious for malignancy. Electronically Signed   By: Elsie Perone M.D.   On: 05/17/2024 16:46   CT ANGIO CHEST PE W OR WO CONTRAST Result Date: 05/17/2024 CLINICAL DATA:  Pulmonary embolism (PE) suspected, high prob. EXAM: CT ANGIOGRAPHY CHEST WITH CONTRAST TECHNIQUE: Multidetector CT imaging of the chest was performed using the standard protocol during bolus administration of intravenous contrast. Multiplanar CT image reconstructions and MIPs were obtained to evaluate the vascular anatomy. RADIATION DOSE REDUCTION: This exam was performed according to the departmental dose-optimization program which includes automated exposure control, adjustment of the mA and/or kV according to patient size and/or use of iterative reconstruction technique. CONTRAST:  75mL OMNIPAQUE  IOHEXOL  350 MG/ML SOLN COMPARISON:  CT scan chest from 04/21/2024. FINDINGS: Cardiovascular: There is small-to-moderate volume predominantly nonocclusive embolism involving the segmental and proximal subsegmental pulmonary artery branches throughout bilateral lungs (marked with electronic arrow sign on series 4). There is no right heart strain or lung infarction. Normal cardiac size. Physiological pericardial effusion. No aortic aneurysm. There are coronary artery calcifications, in keeping with coronary artery disease.  There are also mild peripheral atherosclerotic vascular calcifications of thoracic aorta and its major branches. Mediastinum/Nodes: Visualized thyroid gland appears grossly unremarkable. 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 The mass also abuts ascending aorta, aortic arch and descending thoracic aorta. The fat planes between the mass and the thoracic esophagus are also lost. The esophagus is nondistended precluding optimal assessment. There is mild circumferential thickening of the mid/lower thoracic esophagus, which is most likely seen in the settings of chronic gastroesophageal reflux disease versus esophagitis. No axillary lymphadenopathy by size criteria. Lungs/Pleura: The trachea and left bronchial tree are patent. Redemonstration of an approximately 8 x 17 mm soft tissue nodular thickening along the left wall of the bronchus intermedius, which is similar to the prior study. There are extensive frothy soft tissue in the bronchus intermedius and right lower lobe bronchial tree, favoring mucus/secretions versus aspiration. There are mild-to-moderate upper lobe predominant centrilobular emphysematous changes. There are patchy areas of linear, plate-like atelectasis and/or scarring throughout bilateral lungs. Redemonstration of several in regular solid noncalcified nodules in bilateral lungs, described as follows: *Linear configuration 4 x 19 mm nodule in the left lung upper lobe (series 12, image 34), unchanged since the prior study. *There is a slightly spiculated 7 x 12 mm nodule in the right lung lower lobe (series 12, image 91), unchanged since the prior study. *There are additional stable subcentimeter sized nodularity in the right upper lobe (series 12, images 62 and 78), unchanged since the prior study. No new or suspicious lung nodule. No new mass or  consolidation. No pleural effusion or pneumothorax. Upper Abdomen: There is infiltrating soft tissue surrounding aorta in the visualized portion of upper abdomen which is also grossly similar to the prior study. Remaining visualized upper abdominal viscera within  normal limits. Musculoskeletal: The visualized soft tissues of the chest wall are grossly unremarkable. No suspicious osseous lesions. There are mild multilevel degenerative changes in the visualized spine. L1 superior endplate Schmorl's node again seen. Review of the MIP images confirms the above findings. IMPRESSION: 1. There is small-to-moderate volume predominantly nonocclusive embolism involving the segmental and proximal subsegmental pulmonary artery branches throughout bilateral lungs. No right heart strain or lung infarction. 2. Redemonstration of extensive mediastinal soft tissue as well as soft tissue surrounding aorta in the visualized portion of upper abdomen which are grossly similar to the prior study. Findings favor lymphoma versus other metastatic disease. 3. There are extensive frothy soft tissue in the bronchus intermedius and right lower lobe bronchial tree, favoring mucus/secretions versus aspiration. Stable soft tissue nodular thickening along the left wall of the bronchus intermedius. 4. Stable pre-existing lung nodules, as described above. 5. Multiple other nonacute observations, as described above. Aortic Atherosclerosis (ICD10-I70.0) and Emphysema (ICD10-J43.9). Critical Value/emergent results were called by telephone at the time of interpretation on 05/17/2024 at 4:34 pm to provider Dr. Rocky Massy, who verbally acknowledged these results. Electronically Signed   By: Ree Molt M.D.   On: 05/17/2024 16:36   ADDENDUM: Hematology/oncology Attending: The patient is seen and examined today.  I agree with the above note. 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  She is currently undergoing brain radiation.  She was admitted to the hospital with worsening dyspnea as well as confusion and anxiety. She had a recent PET scan that showed extensive hypermetabolic nodal enlargement throughout the mediastinum, hilum and also thoracic inlet, right supraclavicular and left chest wall.  Molecular studies performed recently showed MET amplification but no other actionable mutations. I had a lengthy discussion with the patient today about her current condition and treatment options.  She understands that her condition is incurable and all the treatment option will be of palliative nature.  I was discussed with her the option of chemoimmunotherapy with carboplatin, Alimta and Keytruda versus palliative care on hospice on outpatient basis. The patient is in agreement with the current plan. Thank you for taking good care of Ms. Rhett.  Please call if you have any questions Disclaimer: This note was dictated with voice recognition software. Similar sounding words can inadvertently be transcribed and may be missed upon review. Sherrod MARLA Sherrod, MD

## 2024-06-02 NOTE — Progress Notes (Signed)
  Progress Note   Patient: Kristina Howe FMW:[id number] DOB: 1961-04-08 DOA: 06/01/2024     1 DOS: the patient was seen and examined on 06/02/2024   Brief hospital course: 63 y.o. female with PMH significant of pulmonary embolism on Lovenox , primary squamous cell carcinoma of the right lung, metastatic to brain presented to ED with shortness of breath.  Patient was recently admitted 05/17/2024-05/18/2024, was found to have pulmonary embolism, was recommended Lovenox  as she had failed oral anticoagulation.  Per patient, she is short of breath with minimal exertion, wheezing, and hypoxic.  Per patient, she feels anxious, miserable and while in lobby at the cancer center, her O2 sats dropped to 84% on room air, does not have O2 at home.  She was transported to the ED. Has nausea but no abdominal pain, vomiting, diarrhea or fevers.  Per patient, she has not been able to walk due to generalized weakness and her boyfriend has to help her in the wheelchair.  Assessment and Plan: Acute respiratory failure with hypoxia (HCC), right lung pneumonia -Multifactorial due to right lung pneumonia, lung cancer and pulmonary embolism, FTT, COPD  - Placed on scheduled nebs, Pulmicort, Brovana, IV Solu-Medrol , flutter valve -cont to wean O2 as tolerated - Placed on IV Zithromax, Rocephin  - obtain blood cultures, urine strep antigen, urine Legionella antigen, sputum cultures - Will likely need home O2 on d/c   Metastatic non-small cell lung cancer with mets to brain, bone -Followed by oncology and radiation oncology outpatient - Patient recently had a PET scan done on 05/21/2024 which showed extensive hypermetabolic nodal enlargement throughout the mediastinum, hilum, thoracic inlet, right supraclavicular and left chest wall, small focus of increased uptake in the left iliac bone, subtle isolated bony metastasis in the differential. -MRI of the brain on 6/18 showed 4 areas of metastatic disease in the right cerebellar  hemisphere, in addition to the highly suspicious lesion present within the left posterior temporal lobe - Oncology and Rad onc following. 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 , will continue   History of subclavian stenosis status post stenting - continue Lovenox    Hyperlipidemia - Continue statin   Anxiety - Continue Xanax  1 mg 3 times daily as needed, fluoxetine    Subjective: Complaining of sob this AM  Physical Exam: Vitals:   06/02/24 0700 06/02/24 0849 06/02/24 0851 06/02/24 1100  BP:    121/74  Pulse:    92  Resp:    19  Temp:    (!) 97.5 F (36.4 C)  TempSrc:    Axillary  SpO2: 98% 98% 98% 100%  Weight:      Height:       General exam: Awake, laying in bed, in nad Respiratory system: Normal respiratory effort, no wheezing Cardiovascular system: regular rate, s1, s2 Gastrointestinal system: Soft, nondistended, positive BS Central nervous system: CN2-12 grossly intact, strength intact Extremities: Perfused, no clubbing Skin: Normal skin turgor, no notable skin lesions seen Psychiatry: Mood normal // no visual hallucinations   Data Reviewed:  Labs reviewed: Na 133, K 4.4, Cr 0.71, WBC 4.2, Hgb 9.3, Plts 394  Family Communication: Pt in room, family at bedside  Disposition: Status is: Inpatient Remains inpatient appropriate because: severity of illness  Planned Discharge Destination: Home    Author: Garnette Pelt, MD 06/02/2024 4:50 PM  For on call review [email or web address].

## 2024-06-02 NOTE — TOC Initial Note (Signed)
 Transition of Care [facility]) - Initial/Assessment Note    Patient Details  Name: Kristina Howe MRN: [id number] Date of Birth: December 08, 1961  Transition of Care [facility]) CM/SW Contact:    Bascom Service, RN Phone Number: 06/02/2024, 2:37 PM  Clinical Narrative: d/c plan home. On 02 will monitor. PMT-GOC-follow for referral.Has own transport home.                  Expected Discharge Plan: Home/Self Care Barriers to Discharge: Continued Medical Work up   Patient Goals and CMS Choice Patient states their goals for this hospitalization and ongoing recovery are:: Home CMS Medicare.gov Compare Post Acute Care list provided to:: Patient Represenative (must comment) (Jeff(s/o)) Choice offered to / list presented to : Spouse [place] ownership interest in [facility].provided to:: Spouse    Expected Discharge Plan and Services   Discharge Planning Services: CM Consult Post Acute Care Choice: Resumption of Svcs/PTA Provider Living arrangements for the past 2 months: Single Family Home                                      Prior Living Arrangements/Services Living arrangements for the past 2 months: Single Family Home Lives with:: Spouse                   Activities of Daily Living   ADL Screening (condition at time of admission) Independently performs ADLs?: Yes (appropriate for developmental age) Is the patient deaf or have difficulty hearing?: Yes Does the patient have difficulty seeing, even when wearing glasses/contacts?: No Does the patient have difficulty concentrating, remembering, or making decisions?: No  Permission Sought/Granted                  Emotional Assessment              Admission diagnosis:  Hypoxia [R09.02] Acute respiratory failure with hypoxia (HCC) [J96.01] Metastatic malignant neoplasm, unspecified site [facility]) [C79.9] Patient Active Problem List   Diagnosis Date Noted   Acute respiratory failure with hypoxia (HCC)  06/01/2024   Hypoxemia 05/17/2024   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) 05/11/2024   Mediastinal adenopathy 04/16/2024   Pulmonary nodules 04/16/2024   Acute pulmonary embolism (HCC) 04/16/2024   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 - [address] [place] [address] Phone: [phone] Fax: [phone]     Social Drivers of Health (SDOH) Social History: SDOH Screenings   Food Insecurity: No Food Insecurity (06/01/2024)  Housing: Low Risk  (06/01/2024)  Transportation Needs: No Transportation Needs (06/01/2024)  Utilities: Not At Risk (06/01/2024)  Depression (PHQ2-9): Low Risk  (05/11/2024)  Tobacco Use: Medium Risk (06/02/2024)   SDOH Interventions:     Readmission Risk Interventions     No data to display

## 2024-06-02 NOTE — Op Note (Signed)
 Name: Kristina Howe    MRN: [id number]   Date: 06/02/2024    DOB: 09/16/1961   STEREOTACTIC RADIOSURGERY OPERATIVE NOTE  PRE-OPERATIVE DIAGNOSIS:  Metastatic brain disease  POST-OPERATIVE DIAGNOSIS:  Metastatic brain disease  PROCEDURE:   Stereotactic Radiosurgery using TrueBeam Linac device Stereotactic Radiosurgery using TrueBeam Linac device, additional lesion Stereotactic Radiosurgery using TrueBeam [place]ac device, additional lesion Stereotactic Radiosurgery using TrueBeam Linac device, additional lesion Stereotactic Radiosurgery using TrueBeam Linac device, additional lesion  SURGEON:  Dorn Ned, MD  RADIATION ONCOLOGIST: Norleen Limes, MD  TECHNIQUE:  The patient underwent a radiation treatment planning session in the radiation oncology simulation suite under the care of the radiation oncology physician and physicist.  I participated closely in the radiation treatment planning afterwards. The patient underwent planning CT which was fused to 3T high resolution MRI with 1 mm axial slices.  These images were fused on the planning system.  We contoured the gross target volumes and subsequently expanded these by 1 mm to yield the Planning Target Volume. I actively participated in the planning process.  I helped to define and review the target contours and also the contours of the optic pathway, eyes, brainstem and selected nearby organs at risk.  All the dose constraints for critical structures were reviewed and compared to AAPM Task Group 101.  The prescription dose conformity was reviewed.  I approved the plan electronically.    Accordingly, [name]  was brought to the TrueBeam stereotactic radiation treatment linac and placed in the custom immobilization mask.  The patient was aligned according to the IR fiducial markers with BrainLab Exactrac, then orthogonal x-rays were used in ExacTrac with the 6DOF robotic table and the shifts were made to align the patient  [name]  received stereotactic radiosurgery to a prescription dose of 9 Gy uneventfully, with plan for two additional fractions for a total of 27 Gy  The detailed description of the procedure is recorded in the radiation oncology procedure note.  I was present for the duration of the procedure.  DISPOSITION:   Following delivery, the patient was transported to nursing in stable condition and monitored for possible acute effects to be discharged to home in stable condition with follow-up in one month.  Dorn Ned, MD [facility]

## 2024-06-03 ENCOUNTER — Encounter: Payer: Self-pay | Admitting: Medical Oncology

## 2024-06-03 DIAGNOSIS — J9601 Acute respiratory failure with hypoxia: Secondary | ICD-10-CM | POA: Diagnosis not present

## 2024-06-03 DIAGNOSIS — C799 Secondary malignant neoplasm of unspecified site: Secondary | ICD-10-CM | POA: Diagnosis not present

## 2024-06-03 LAB — CBC
HCT: 29.8 % — ABNORMAL LOW (ref 36.0–46.0)
Hemoglobin: 9.1 g/dL — ABNORMAL LOW (ref 12.0–15.0)
MCH: 26.1 pg (ref 26.0–34.0)
MCHC: 30.5 g/dL (ref 30.0–36.0)
MCV: 85.4 fL (ref 80.0–100.0)
Platelets: 385 10*3/uL (ref 150–400)
RBC: 3.49 MIL/uL — ABNORMAL LOW (ref 3.87–5.11)
RDW: 15.7 % — ABNORMAL HIGH (ref 11.5–15.5)
WBC: 7.9 10*3/uL (ref 4.0–10.5)
nRBC: 0 % (ref 0.0–0.2)

## 2024-06-03 LAB — COMPREHENSIVE METABOLIC PANEL WITH GFR
ALT: 11 U/L (ref 0–44)
AST: 13 U/L — ABNORMAL LOW (ref 15–41)
Albumin: 2.7 g/dL — ABNORMAL LOW (ref 3.5–5.0)
Alkaline Phosphatase: 75 U/L (ref 38–126)
Anion gap: 11 (ref 5–15)
BUN: 18 mg/dL (ref 8–23)
CO2: 22 mmol/L (ref 22–32)
Calcium: 9.3 mg/dL (ref 8.9–10.3)
Chloride: 103 mmol/L (ref 98–111)
Creatinine, Ser: 0.86 mg/dL (ref 0.44–1.00)
GFR, Estimated: >60 mL/min (ref >60)
Glucose, Bld: 95 mg/dL (ref 70–99)
Potassium: 4.1 mmol/L (ref 3.5–5.1)
Sodium: 136 mmol/L (ref 135–145)
Total Bilirubin: 0.3 mg/dL (ref 0.0–1.2)
Total Protein: 6.3 g/dL — ABNORMAL LOW (ref 6.5–8.1)

## 2024-06-03 MED ORDER — AZITHROMYCIN 500 MG PO TABS
500.0000 mg | ORAL_TABLET | Freq: Every day | ORAL | Status: DC
  Administered 2024-06-03 – 2024-06-04 (×2): 500 mg via ORAL
  Filled 2024-06-03 (×2): qty 1

## 2024-06-03 NOTE — Progress Notes (Signed)
 SATURATION QUALIFICATIONS: (This note is used to comply with regulatory documentation for home oxygen)  Patient Saturations on Room Air at Rest = 90%  Patient Saturations on Room Air while Ambulating = 85%  Patient Saturations on 3 Liters of oxygen while Ambulating = 93%  Please briefly explain why patient needs home oxygen:Patient with activity ambulation in hallway with decreased oxygen saturations and increased work of breathing

## 2024-06-03 NOTE — Progress Notes (Signed)
 Mobility Specialist - Progress Note   06/03/24 1024  Oxygen Therapy  SpO2 95 %  O2 Device Nasal Cannula  O2 Flow Rate (L/min) 4 L/min  Patient Activity (if Appropriate) Ambulating  Mobility  Activity Ambulated independently in hallway  Level of Assistance Independent  Assistive Device None  Distance Ambulated (ft) 80 ft  Activity Response Tolerated well  Mobility Referral Yes  Mobility visit 1 Mobility  Mobility Specialist Start Time (ACUTE ONLY) 1013  Mobility Specialist Stop Time (ACUTE ONLY) 1024  Mobility Specialist Time Calculation (min) (ACUTE ONLY) 11 min   Pt received in bed and agreeable to mobility. Distance limited d/t fatigue. Pt to [facility] after session with all needs met. NT aware.  Pre-mobility: 90 HR, 93% SpO2 (4L [place]) During mobility: 91 HR, 95% SpO2 (4L [place]) Post-mobility:  92% SPO2 (4L [place])  Chief Technology Officer

## 2024-06-03 NOTE — Progress Notes (Signed)
 [employer] form provider information completed and mailed back to pt.

## 2024-06-03 NOTE — Progress Notes (Signed)
  Progress Note   Patient: Kristina Howe FMW:[id number] DOB: 1961/04/03 DOA: 06/01/2024     2 DOS: the patient was seen and examined on 06/03/2024   Brief hospital course: 63 y.o. female with PMH significant of pulmonary embolism on Lovenox , primary squamous cell carcinoma of the right lung, metastatic to brain presented to ED with shortness of breath.  Patient was recently admitted 05/17/2024-05/18/2024, was found to have pulmonary embolism, was recommended Lovenox  as she had failed oral anticoagulation.  Per patient, she is short of breath with minimal exertion, wheezing, and hypoxic.  Per patient, she feels anxious, miserable and while in lobby at the cancer center, her O2 sats dropped to 84% on room air, does not have O2 at home.  She was transported to the ED. Has nausea but no abdominal pain, vomiting, diarrhea or fevers.  Per patient, she has not been able to walk due to generalized weakness and her boyfriend has to help her in the wheelchair.  Assessment and Plan: Acute respiratory failure with hypoxia (HCC), right lung pneumonia -Multifactorial due to right lung pneumonia, lung cancer and pulmonary embolism, FTT, COPD  - Placed on scheduled nebs, Pulmicort, Brovana, IV Solu-Medrol , flutter valve -cont to wean O2 as tolerated - Placed on IV Zithromax, Rocephin  - obtain blood cultures, urine strep antigen, urine Legionella antigen, sputum cultures - Will need home O2 on d/c, needing 3L   Metastatic non-small cell lung cancer with mets to brain, bone -Followed by oncology and radiation oncology outpatient - Patient recently had a PET scan done on 05/21/2024 which showed extensive hypermetabolic nodal enlargement throughout the mediastinum, hilum, thoracic inlet, right supraclavicular and left chest wall, small focus of increased uptake in the left iliac bone, subtle isolated bony metastasis in the differential. -MRI of the brain on 6/18 showed 4 areas of metastatic disease in the right  cerebellar hemisphere, in addition to the highly suspicious lesion present within the left posterior temporal lobe - Oncology and Rad onc following. 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 , will continue   History of subclavian stenosis status post stenting - continue Lovenox    Hyperlipidemia - Continue statin   Anxiety - Continue Xanax  1 mg 3 times daily as needed, fluoxetine    Subjective: Breathing better with O2  Physical Exam: Vitals:   06/02/24 1953 06/03/24 0447 06/03/24 1024 06/03/24 1249  BP:  (!) 144/92  126/80  Pulse:  81  78  Resp:  20  20  Temp:  98.5 F (36.9 C)  98 F (36.7 C)  TempSrc:  Oral  Oral  SpO2: 93% 94% 95% 98%  Weight:      Height:       General exam: Conversant, in no acute distress Respiratory system: normal chest rise, clear, no audible wheezing Cardiovascular system: regular rhythm, s1-s2 Gastrointestinal system: Nondistended, nontender, pos BS Central nervous system: No seizures, no tremors Extremities: No cyanosis, no joint deformities Skin: No rashes, no pallor Psychiatry: Affect normal // no auditory hallucinations   Data Reviewed:  Labs reviewed: Na 136, K 4.1, Cr 0.86, WBC 7.9, hgb 9.1  Family Communication: Pt in room, family not at bedside  Disposition: Status is: Inpatient Remains inpatient appropriate because: severity of illness  Planned Discharge Destination: Home    Author: Garnette Pelt, MD 06/03/2024 5:07 PM  For on call review [email or web address].

## 2024-06-03 NOTE — Progress Notes (Signed)
 Daily Progress Note   Patient Name: Kristina Howe       Date: 06/03/2024 DOB: 1961/06/29  Age: 63 y.o. MRN#: [id number] Attending Physician: Cindy Garnette POUR, MD Primary Care Physician: [name] BROCKS, FNP Admit Date: 06/01/2024  Reason for Consultation/Follow-up: Establishing goals of care and Non pain symptom management  Subjective: Awake alert resting in bed states that her anxiety is better managed today denies any shortness of breath currently asks when she is going to be discharged asks if she will have home oxygen upon discharge.  Length of Stay: 2  Current Medications: Scheduled Meds:   arformoterol  15 mcg Nebulization BID   atorvastatin   40 mg Oral Daily   budesonide (PULMICORT) nebulizer solution  2 mg Nebulization Q12H   clonazePAM  0.5 mg Oral BID   enoxaparin   1 mg/kg Subcutaneous Q12H   ferrous sulfate  325 mg Oral Q breakfast   FLUoxetine   20 mg Oral Daily   guaiFENesin   600 mg Oral BID   predniSONE  40 mg Oral Q breakfast    Continuous Infusions:  azithromycin Stopped (06/02/24 1912)   cefTRIAXone (ROCEPHIN)  IV Stopped (06/02/24 1729)    PRN Meds: acetaminophen  **OR** acetaminophen , ALPRAZolam , HYDROmorphone  (DILAUDID ) injection, LORazepam , ondansetron  **OR** ondansetron  (ZOFRAN ) IV, mouth rinse, oxyCODONE , polyethylene glycol  Physical Exam          Vital Signs: BP (!) 144/92 (BP Location: Left Arm)   Pulse 81   Temp 98.5 F (36.9 C) (Oral)   Resp 20   Ht 5' 7 (1.702 m)   Wt 43.8 kg   SpO2 95%   BMI 15.12 kg/m  SpO2: SpO2: 95 % O2 Device: O2 Device: Nasal Cannula O2 Flow Rate: O2 Flow Rate (L/min): 4 L/min  Intake/output summary:  Intake/Output Summary (Last 24 hours) at 06/03/2024 1243 Last data filed at 06/03/2024 1000 Gross per 24 hour   Intake 707.85 ml  Output --  Net 707.85 ml   LBM: Last BM Date : 06/01/24 Baseline Weight: Weight: 43.8 kg Most recent weight: Weight: 43.8 kg       Palliative Assessment/Data:      Patient Active Problem List   Diagnosis Date Noted   Acute respiratory failure with hypoxia (HCC) 06/01/2024   Hypoxemia 05/17/2024   Primary squamous cell carcinoma of lower lobe of  right lung (HCC) 05/11/2024   Mediastinal adenopathy 04/16/2024   Pulmonary nodules 04/16/2024   Acute pulmonary embolism (HCC) 04/16/2024   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 Now admitted from [facility] on 6/24 due to shortness of breath. Follows with Dr. Mohamed/Medical Oncology.   Assessment:  Non-small cell lung cancer with mets to brain bone and lymph nodes Shortness of breath Anxiety  Recommendations/Plan: Continue current pain and non-- pain symptom management Continue current anxiolytic regimen Recommend palliative follow-up at [facility] for ongoing symptom management and for additional support    Code Status:    Code Status Orders  (From admission, onward)           Start     Ordered   06/01/24 1809  Do not attempt resuscitation (DNR)- Limited -Do Not Intubate (DNI)  Continuous       Question Answer Comment  If pulseless and not breathing No CPR or chest compressions.   In Pre-Arrest Conditions (Patient Is Breathing and Has A Pulse) Do not intubate. Provide all appropriate non-invasive medical interventions. Avoid ICU transfer unless indicated or required.   Consent: Discussion documented in EHR or advanced directives reviewed      06/01/24 1812           Code Status History     Date Active Date Inactive Code Status  Order ID Comments User Context   05/17/2024 1743 05/18/2024 1737 Full Code [id number]  Raenelle Coria, MD ED   03/07/2024 2007 03/12/2024 1445 Full Code [id number]  Keturah Carrier, MD Inpatient       Prognosis:  Unable to determine  Discharge Planning: Home with Palliative Services  Care plan was discussed with  patient  Thank you for allowing the Palliative Medicine Team to assist in the care of this patient.  Mod MDM.      Greater than 50%  of this time was spent counseling and coordinating care related to the above assessment and plan.  Lonia Serve, MD  Please contact Palliative Medicine Team phone at [phone] for questions and concerns.

## 2024-06-04 ENCOUNTER — Other Ambulatory Visit (HOSPITAL_COMMUNITY): Payer: Self-pay

## 2024-06-04 ENCOUNTER — Ambulatory Visit
Admission: RE | Admit: 2024-06-04 | Discharge: 2024-06-04 | Disposition: A | Discharge status: Home / Self Care | Source: Ambulatory Visit | Attending: Radiation Oncology | Admitting: Radiation Oncology

## 2024-06-04 ENCOUNTER — Other Ambulatory Visit (HOSPITAL_BASED_OUTPATIENT_CLINIC_OR_DEPARTMENT_OTHER): Payer: Self-pay

## 2024-06-04 ENCOUNTER — Other Ambulatory Visit: Payer: Self-pay

## 2024-06-04 DIAGNOSIS — C799 Secondary malignant neoplasm of unspecified site: Secondary | ICD-10-CM | POA: Diagnosis not present

## 2024-06-04 DIAGNOSIS — J9601 Acute respiratory failure with hypoxia: Secondary | ICD-10-CM | POA: Diagnosis not present

## 2024-06-04 LAB — RAD ONC ARIA SESSION SUMMARY
Course Elapsed Days: 2
Plan Fractions Treated to Date: 2
Plan Prescribed Dose Per Fraction: 9 Gy
Plan Total Fractions Prescribed: 3
Plan Total Prescribed Dose: 27 Gy
Reference Point Dosage Given to Date: 18 Gy
Reference Point Session Dosage Given: 9 Gy
Session Number: 2

## 2024-06-04 LAB — LEGIONELLA PNEUMOPHILA SEROGP 1 UR AG: L. pneumophila Serogp 1 Ur Ag: NEGATIVE

## 2024-06-04 MED ORDER — BUDESONIDE 0.5 MG/2ML IN SUSP
RESPIRATORY_TRACT | Status: AC
  Filled 2024-06-04: qty 6

## 2024-06-04 MED ORDER — PREDNISONE 20 MG PO TABS
40.0000 mg | ORAL_TABLET | Freq: Every day | ORAL | 0 refills | Status: AC
  Filled 2024-06-04: qty 8, 4d supply, fill #0

## 2024-06-04 MED ORDER — AZITHROMYCIN 500 MG PO TABS
500.0000 mg | ORAL_TABLET | Freq: Every day | ORAL | 0 refills | Status: AC
  Filled 2024-06-04: qty 3, 3d supply, fill #0

## 2024-06-04 MED ORDER — AMOXICILLIN-POT CLAVULANATE 875-125 MG PO TABS
1.0000 | ORAL_TABLET | Freq: Two times a day (BID) | ORAL | 0 refills | Status: AC
  Filled 2024-06-04: qty 6, 3d supply, fill #0

## 2024-06-04 NOTE — TOC Transition Note (Signed)
 Transition of Care [facility]) - Discharge Note   Patient Details  Name: Kristina Howe MRN: [id number] Date of Birth: 1961-11-21  [facility] CM/SW Contact:  Bascom Service, RN Phone Number: 06/04/2024, 2:46 PM   Clinical Narrative:home 02 ordered, patient agree to receive home 02-no preference-[employer] rep Jermaine to deliver home 02 tank to rm prior d/c. Has own transport home.       Final next level of care: Home/Self Care Barriers to Discharge: No Barriers Identified   Patient Goals and CMS Choice Patient states their goals for this hospitalization and ongoing recovery are:: Home CMS Medicare.gov Compare Post Acute Care list provided to:: Patient Choice offered to / list presented to : Patient [place] ownership interest in [facility].provided to:: Patient    Discharge Placement                       Discharge Plan and Services Additional resources added to the After Visit Summary for     Discharge Planning Services: CM Consult Post Acute Care Choice: Durable Medical Equipment            DME Agency: [employer] Date DME Agency Contacted: 06/04/24 Time DME Agency Contacted: 1446 Representative spoke with at DME Agency: [place]            Social Drivers of Health (SDOH) Interventions SDOH Screenings   Food Insecurity: No Food Insecurity (06/01/2024)  Housing: Low Risk  (06/01/2024)  Transportation Needs: No Transportation Needs (06/01/2024)  Utilities: Not At Risk (06/01/2024)  Depression (PHQ2-9): Low Risk  (05/11/2024)  Tobacco Use: Medium Risk (06/02/2024)     Readmission Risk Interventions    06/02/2024    2:39 PM  Readmission Risk Prevention Plan  Transportation Screening Complete  PCP or Specialist Appt within 3-5 Days Complete  HRI or Home Care Consult Complete  Social Work Consult for Recovery Care Planning/Counseling Complete  Palliative Care Screening Complete  Medication Review Oceanographer) Complete

## 2024-06-04 NOTE — Plan of Care (Signed)
 Problem: Education: Goal: Knowledge of General Education information will improve Description: Including pain rating scale, medication(s)/side effects and non-pharmacologic comfort measures 06/04/2024 1623 by Rosanne Elspeth HERO, RN Outcome: Adequate for Discharge 06/04/2024 0906 by Rosanne Elspeth HERO, RN Outcome: Progressing   Problem: Health Behavior/Discharge Planning: Goal: Ability to manage health-related needs will improve 06/04/2024 1623 by Rosanne Elspeth HERO, RN Outcome: Adequate for Discharge 06/04/2024 0906 by Rosanne Elspeth HERO, RN Outcome: Progressing   Problem: Clinical Measurements: Goal: Ability to maintain clinical measurements within normal limits will improve 06/04/2024 1623 by Rosanne Elspeth HERO, RN Outcome: Adequate for Discharge 06/04/2024 0906 by Rosanne Elspeth HERO, RN Outcome: Progressing Goal: Will remain free from infection 06/04/2024 1623 by Rosanne Elspeth HERO, RN Outcome: Adequate for Discharge 06/04/2024 0906 by Rosanne Elspeth HERO, RN Outcome: Progressing Goal: Diagnostic test results will improve 06/04/2024 1623 by Rosanne Elspeth HERO, RN Outcome: Adequate for Discharge 06/04/2024 9093 by Rosanne Elspeth HERO, RN Outcome: Progressing Goal: Respiratory complications will improve 06/04/2024 1623 by Rosanne Elspeth HERO, RN Outcome: Adequate for Discharge 06/04/2024 0906 by Rosanne Elspeth HERO, RN Outcome: Progressing Goal: Cardiovascular complication will be avoided 06/04/2024 1623 by Rosanne Elspeth HERO, RN Outcome: Adequate for Discharge 06/04/2024 0906 by Rosanne Elspeth HERO, RN Outcome: Progressing   Problem: Activity: Goal: Risk for activity intolerance will decrease 06/04/2024 1623 by Rosanne Elspeth HERO, RN Outcome: Adequate for Discharge 06/04/2024 [phone] by Rosanne Elspeth HERO, RN Outcome: Progressing   Problem: Nutrition: Goal: Adequate nutrition will be maintained 06/04/2024 1623 by Rosanne Elspeth HERO, RN Outcome:  Adequate for Discharge 06/04/2024 0906 by Rosanne Elspeth HERO, RN Outcome: Progressing   Problem: Coping: Goal: Level of anxiety will decrease 06/04/2024 1623 by Rosanne Elspeth HERO, RN Outcome: Adequate for Discharge 06/04/2024 0906 by Rosanne Elspeth HERO, RN Outcome: Progressing   Problem: Elimination: Goal: Will not experience complications related to bowel motility 06/04/2024 1623 by Rosanne Elspeth HERO, RN Outcome: Adequate for Discharge 06/04/2024 0906 by Rosanne Elspeth HERO, RN Outcome: Progressing Goal: Will not experience complications related to urinary retention 06/04/2024 1623 by Rosanne Elspeth HERO, RN Outcome: Adequate for Discharge 06/04/2024 0906 by Rosanne Elspeth HERO, RN Outcome: Progressing   Problem: Pain Managment: Goal: General experience of comfort will improve and/or be controlled 06/04/2024 1623 by Rosanne Elspeth HERO, RN Outcome: Adequate for Discharge 06/04/2024 0906 by Rosanne Elspeth HERO, RN Outcome: Progressing   Problem: Safety: Goal: Ability to remain free from injury will improve 06/04/2024 1623 by Rosanne Elspeth HERO, RN Outcome: Adequate for Discharge 06/04/2024 0906 by Rosanne Elspeth HERO, RN Outcome: Progressing   Problem: Skin Integrity: Goal: Risk for impaired skin integrity will decrease 06/04/2024 1623 by Rosanne Elspeth HERO, RN Outcome: Adequate for Discharge 06/04/2024 0906 by Rosanne Elspeth HERO, RN Outcome: Progressing   Problem: Education: Goal: Knowledge of disease or condition will improve 06/04/2024 1623 by Rosanne Elspeth HERO, RN Outcome: Adequate for Discharge 06/04/2024 9093 by Rosanne Elspeth HERO, RN Outcome: Progressing Goal: Knowledge of the prescribed therapeutic regimen will improve 06/04/2024 1623 by Rosanne Elspeth HERO, RN Outcome: Adequate for Discharge 06/04/2024 9093 by Rosanne Elspeth HERO, RN Outcome: Progressing Goal: Individualized Educational Video(s) 06/04/2024 1623 by Rosanne Elspeth HERO,  RN Outcome: Adequate for Discharge 06/04/2024 [phone] by Rosanne Elspeth HERO, RN Outcome: Progressing   Problem: Activity: Goal: Ability to tolerate increased activity will improve 06/04/2024 1623 by Rosanne Elspeth HERO, RN Outcome: Adequate for Discharge 06/04/2024 [phone] by Rosanne Elspeth HERO, RN Outcome: Progressing Goal: Will verbalize the importance of balancing activity with adequate rest periods 06/04/2024  1623 by Rosanne Elspeth HERO, RN Outcome: Adequate for Discharge 06/04/2024 [phone] by Rosanne Elspeth HERO, RN Outcome: Progressing   Problem: Respiratory: Goal: Ability to maintain a clear airway will improve 06/04/2024 1623 by Rosanne Elspeth HERO, RN Outcome: Adequate for Discharge 06/04/2024 9093 by Rosanne Elspeth HERO, RN Outcome: Progressing Goal: Levels of oxygenation will improve 06/04/2024 1623 by Rosanne Elspeth HERO, RN Outcome: Adequate for Discharge 06/04/2024 0906 by Rosanne Elspeth HERO, RN Outcome: Progressing Goal: Ability to maintain adequate ventilation will improve 06/04/2024 1623 by Rosanne Elspeth HERO, RN Outcome: Adequate for Discharge 06/04/2024 0906 by Rosanne Elspeth HERO, RN Outcome: Progressing   Problem: Activity: Goal: Ability to tolerate increased activity will improve 06/04/2024 1623 by Rosanne Elspeth HERO, RN Outcome: Adequate for Discharge 06/04/2024 [phone] by Rosanne Elspeth HERO, RN Outcome: Progressing   Problem: Clinical Measurements: Goal: Ability to maintain a body temperature in the normal range will improve 06/04/2024 1623 by Rosanne Elspeth HERO, RN Outcome: Adequate for Discharge 06/04/2024 0906 by Rosanne Elspeth HERO, RN Outcome: Progressing   Problem: Respiratory: Goal: Ability to maintain adequate ventilation will improve 06/04/2024 1623 by Rosanne Elspeth HERO, RN Outcome: Adequate for Discharge 06/04/2024 0906 by Rosanne Elspeth HERO, RN Outcome: Progressing Goal: Ability to maintain a clear airway will improve 06/04/2024  1623 by Rosanne Elspeth HERO, RN Outcome: Adequate for Discharge 06/04/2024 9093 by Rosanne Elspeth HERO, RN Outcome: Progressing

## 2024-06-04 NOTE — Progress Notes (Signed)
 Discharge medications delivered to patient at bedside [facility]

## 2024-06-04 NOTE — Progress Notes (Signed)
 Mobility Specialist - Progress Note  ([place] 3L) Pre-mobility: 85 bpm HR, 92% SpO2 During mobility: 92 bpm HR, 87-89% SpO2 Post-mobility: 89 bpm HR, 90% SPO2   06/04/24 1006  Mobility  Activity Ambulated with assistance in hallway  Level of Assistance Standby assist, set-up cues, supervision of patient - no hands on  Assistive Device Front wheel walker  Distance Ambulated (ft) 20 ft  Range of Motion/Exercises Active  Activity Response Tolerated fair  Mobility Referral Yes  Mobility visit 1 Mobility  Mobility Specialist Start Time (ACUTE ONLY) 0955  Mobility Specialist Stop Time (ACUTE ONLY) 1006  Mobility Specialist Time Calculation (min) (ACUTE ONLY) 11 min   Pt was found in bed and agreeable to ambulate. Stated feeling fatigued and had x1 brief standing rest break due to weakness. At EOS returned to bed with all needs met. Call bell in reach and RN notified.  Erminio Leos,  Mobility Specialist Can be reached via Secure Chat

## 2024-06-04 NOTE — TOC CM/SW Note ([facility] Needed)
    Durable Medical Equipment  (From admission, onward)           Start     Ordered   06/04/24 1313  For home use only DME oxygen  Once       Question Answer Comment  Length of Need 6 Months   Mode or (Route) Nasal cannula   Liters per Minute 3   Frequency Continuous (stationary and portable oxygen unit needed)   Oxygen delivery system Gas      06/04/24 1312

## 2024-06-04 NOTE — Discharge Summary (Signed)
 Physician Discharge Summary   Patient: Kristina Howe MRN: [id number] DOB: [birth year]  Admit date:     06/01/2024  Discharge date: 06/04/24  Discharge Physician: Garnette Pelt   PCP: Kristina Bernardino BROCKS, Howe   Recommendations at discharge:    Follow up with PCP in 1-2 weeks Follow up with Oncology as scheduled Follow up with Radiation tx as scheduled  Discharge Diagnoses: Principal Problem:   Acute respiratory failure with hypoxia (HCC)  Resolved Problems:   * No resolved hospital problems. *  Hospital Course: 63 y.o. female with PMH significant of pulmonary embolism on Lovenox , primary squamous cell carcinoma of the right lung, metastatic to brain presented to ED with shortness of breath.  Patient was recently admitted 05/17/2024-05/18/2024, was found to have pulmonary embolism, was recommended Lovenox  as she had failed oral anticoagulation.  Per patient, she is short of breath with minimal exertion, wheezing, and hypoxic.  Per patient, she feels anxious, miserable and while in lobby at the cancer center, her O2 sats dropped to 84% on room air, does not have O2 at home.  She was transported to the ED. Has nausea but no abdominal pain, vomiting, diarrhea or fevers.  Per patient, she has not been able to walk due to generalized weakness and her boyfriend has to help her in the wheelchair.  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 -MRI of the brain on 6/18 showed 4 areas of metastatic disease in the right cerebellar hemisphere, in addition to the highly suspicious lesion present within the left posterior temporal lobe - Oncology and Rad onc following. 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 , will continue   History of subclavian stenosis status post stenting - continue Lovenox    Hyperlipidemia - Continue statin   Anxiety - Continue Xanax  1 mg 3 times daily as needed, fluoxetine         Consultants: Oncology, Rad Onc Procedures performed:   Disposition: Home Diet recommendation:  Regular diet DISCHARGE MEDICATION: Allergies as of 06/04/2024   No Known Allergies      Medication List     STOP taking these medications    methylPREDNISolone  4 MG Tbpk tablet Commonly known as: MEDROL  DOSEPAK       TAKE these medications    albuterol  108 (90 Base) MCG/ACT inhaler Commonly known as: VENTOLIN  HFA Inhale 2 puffs into the lungs every 6 (six) hours as needed for wheezing or shortness of breath. What changed: when to take this   ALPRAZolam  1 MG tablet Commonly known as: XANAX  Take 1 tablet (1 mg total) by mouth 3 (three) times daily as needed for anxiety. What changed: when to take  this   amoxicillin-clavulanate 875-125 MG tablet Commonly known as: AUGMENTIN Take 1 tablet by mouth 2 (two) times daily for 3 days.   atorvastatin  40 MG tablet Commonly known as: LIPITOR Take 1 tablet (40 mg total) by mouth daily.   azithromycin  500 MG tablet Commonly known as: ZITHROMAX  Take 1 tablet (500 mg total) by mouth daily at 6 PM  for 3 days.   enoxaparin  60 MG/0.6ML injection Commonly known as: LOVENOX  Inject 0.5 mLs (50 mg total) into the skin every 12 (twelve) hours. What changed: Another medication with the same name was removed. Continue taking this medication, and follow the directions you see here.   ferrous sulfate  325 (65 FE) MG tablet Take 325 mg by mouth daily with breakfast.   FLUoxetine  20 MG capsule Commonly known as: PROZAC  Take 20 mg by mouth daily.   magnesium  30 MG tablet Take 60 mg by mouth at bedtime.   multivitamin with minerals tablet Take 1 tablet by mouth daily with breakfast.   oxyCODONE  5 MG immediate release tablet Commonly known as: Oxy IR/ROXICODONE  Take 1 tablet (5 mg total) by mouth every 6 (six) hours as needed for severe pain (pain score 7-10).   predniSONE  20 MG tablet Commonly known as: DELTASONE  Take 2 tablets (40 mg total) by mouth daily with breakfast for 4 days. Start taking on: June 05, 2024   triamterene -hydrochlorothiazide  37.5-25 MG tablet Commonly known as: MAXZIDE -25 Take 1 tablet by mouth daily.   Tylenol  325 MG tablet Generic drug: acetaminophen  Take 325-650 mg by mouth every 6 (six) hours as needed for mild pain (pain score 1-3) or headache.   valsartan 320 MG tablet Commonly known as: DIOVAN Take 320 mg by mouth daily.               Durable Medical Equipment  (From admission, onward)           Start     Ordered   06/04/24 1313  For home use only DME oxygen  Once       Question Answer Comment  Length of Need 6 Months   Mode or (Route) Nasal cannula   Liters per Minute 3   Frequency Continuous (stationary and portable oxygen unit needed)   Oxygen delivery system Gas      06/04/24 1312            Follow-up Information     [facility] Follow up.   Why: home oxygen Contact information: [address] [facility] [place]  [phone]        Kristina Howe Follow up in 2 week(s).   Specialty: Nurse Practitioner Why:  Hospital follow up Contact information: [id number] [place] [address] [phone]                Discharge Exam: Kristina Howe   06/01/24 1520  Weight: 43.8 kg   General exam: Awake, laying in bed, in nad Respiratory system: Normal respiratory effort, no wheezing Cardiovascular system: regular rate, s1, s2 Gastrointestinal system: Soft, nondistended, positive BS Central nervous system: CN2-12 grossly intact, strength intact Extremities: Perfused, no clubbing Skin: Normal skin turgor, no notable skin lesions seen Psychiatry: Mood normal // no visual hallucinations   Condition at discharge: fair  The results of significant diagnostics from this hospitalization (including imaging, microbiology, ancillary and laboratory) are listed below for reference.   Imaging Studies: DG Chest Portable 1 View Result Date: 06/01/2024 CLINICAL DATA:  Shortness of breath EXAM: PORTABLE CHEST  1 VIEW COMPARISON:  05/23/2024 FINDINGS: Opacity in the right lower hemithorax could reflect a combination of airspace disease and effusion. Left lung clear. Heart is normal size. Again noted is bulky mediastinal adenopathy as seen on prior CT. No acute bony abnormality. IMPRESSION: New opacification in the right lower chest may reflect a combination of effusion and airspace disease/collapse. Bulky mediastinal adenopathy. Electronically Signed   By: Franky Crease M.D.   On: 06/01/2024 16:17   MR Brain W Wo Contrast Result Date: 05/26/2024 CLINICAL DATA:  Metastatic disease evaluation 3T SRS Protocol for radiation treatment planning EXAM: MRI HEAD WITHOUT AND WITH CONTRAST TECHNIQUE: Multiplanar, multiecho pulse sequences of the brain and surrounding structures were obtained without and with intravenous contrast. CONTRAST:  5mL GADAVIST  GADOBUTROL  1 MMOL/ML IV SOLN COMPARISON:  MRI of the brain dated May 17, 2024. FINDINGS: Brain: An ovoid peripherally enhancing lesion is again demonstrated left  posterior temporal lobe, measuring approximately 12 x 11 x 10 mm. There are additional nodular foci of enhancement present within the right cerebellar hemisphere, which are concerning for metastatic disease. The largest lesion measures approximately 3 mm in diameter and is seen on image 114 of series 1100. There are smaller focal areas of concerning enhancement present within the right cerebellum on images one hundred three, 105 and 108. There is moderate diffuse periventricular and deep cerebral white matter disease present. Vascular: Normal vascular flow voids. Skull and upper cervical spine: Normal signal intensity. Sinuses/Orbits: Negative. Other: None. IMPRESSION: 1. There are 4 areas of concerning enhancement present within the right cerebellar hemisphere, which are suspicious for metastatic disease. These are in addition to the highly suspicious lesion present within the left posterior temporal lobe. 2. Advanced cerebral white matter disease. Electronically Signed   By: Evalene Coho M.D.   On: 05/26/2024 16:07   CT Angio Chest Pulmonary Embolism (PE) W or WO Contrast Result Date: 05/24/2024 CLINICAL DATA:  63 year old female with lung cancer. Pulmonary emboli, predominantly nonocclusive, affecting bilateral pulmonary arteries on CTA last week. Near syncope. * Tracking Code: BO * EXAM: CT ANGIOGRAPHY CHEST WITH CONTRAST TECHNIQUE: Multidetector CT imaging of the chest was performed using the standard protocol during bolus administration of intravenous contrast. Multiplanar CT image reconstructions and MIPs were obtained to evaluate the vascular anatomy. RADIATION DOSE REDUCTION: This exam was performed according to the departmental dose-optimization program which includes automated exposure control, adjustment of the mA and/or kV according to patient size and/or use of iterative reconstruction technique. CONTRAST:  75mL OMNIPAQUE  IOHEXOL  350 MG/ML SOLN COMPARISON:  CTA chest 05/17/2024, PET-CT  05/21/2024, and earlier. FINDINGS: Cardiovascular: Excellent contrast bolus timing in the pulmonary arterial tree. Mass effect on the central pulmonary arteries related to bulky mediastinal tumor. Stable to slightly smaller nonocclusive thrombi in left upper lobe branches seen on series 7, images 134, 146. No saddle or lobar embolus. Stable to smaller contralateral right lower lobe nonocclusive PE on series 7, image 175. Stable more occlusive appearing right lower lobe clot image 203. Stable nonocclusive thrombus in branches of the right upper lobe visible on images 120 and 124. But in lateral segment right middle lobe branches there is progressed segmental thrombus series 7, images 179 and 188. Nearby medial segmental clot appears stable on image 189. Heart size remains normal. No pericardial effusion. Calcified aortic atherosclerosis. Proximal great vessels appear to remain patent, the proximal right CCA and subclavian arteries are stented as before. Mediastinum/Nodes: Bulky diffuse mediastinal lobulated tumor, bilateral mediastinal lymphadenopathy. Not significantly changed from the CTA  last week or the recent PET-CT. Lungs/Pleura: Mediastinal tumor nearly occludes the bronchus intermedius on series 6, image 60, unchanged from recent exams and distal right middle and lower lobe airway ventilation has mildly improved since 05/17/2024, with less retained secretions. No new airway stenosis. Background centrilobular emphysema in both lungs. Stable ventilation since 05/17/2024. No pneumothorax or pleural effusion. Right lower lobe peribronchial 11 mm lung nodule/mass redemonstrated on series 6, image 91. Linear left upper lobe probable lung scarring on image 24. Mild scarring versus nodularity in the lateral right upper lobe is stable on image 57. More curvilinear bilateral costophrenic angle scarring is stable. Upper Abdomen: Abdominal Calcified aortic atherosclerosis. Negative partially visible a centrally  noncontrast liver, spleen, adrenal glands, left kidney, and bowel in the upper abdomen. Musculoskeletal: Stable visualized osseous structures. T10 congenital butterfly vertebra, normal variant. Degenerative appearing L1 superior endplate Schmorl's node is stable. No acute or destructive osseous lesion identified in the chest. Review of the MIP images confirms the above findings. IMPRESSION: 1. Advanced Thoracic Malignancy. Bulky mediastinal tumor invading and nearly occluding the right bronchus intermedius, bilateral hilar lymphadenopathy not significantly changed from PET-CT 3 days ago. 2. Generally stable pulmonary artery branch PE since the CTA on 05/17/2024. However, there is increased more occlusive appearing segmental pulmonary embolus in the lateral right middle lobe segmental branches. But most of the other pulmonary artery branch involvement is nonocclusive, and there is no lobar or saddle embolus. 3. Stable Emphysema (ICD10-J43.9). Stable bilateral lung ventilation with several lung nodules which were evaluated on recent PET. 4.  Aortic Atherosclerosis (ICD10-I70.0). Electronically Signed   By: VEAR Hurst M.D.   On: 05/24/2024 05:16   DG Chest Portable 1 View Result Date: 05/23/2024 CLINICAL DATA:  Syncope Pt reports that she feels like she is going to pass out x 2 days with worsening today. Pt has Stage 3 lung cancer and recently diagnosed with a PE. Pt reports chest pain and SOB, as well. EXAM: PORTABLE CHEST 1 VIEW COMPARISON:  Chest x-ray 05/20/2024, CT chest 05/17/2024 FINDINGS: The heart and mediastinal contours are unchanged with superior mediastinal irregular contour best evaluated on CT angio chest 07/17/2024. Chronic coarsened interstitial markings with no overt pulmonary edema. No focal consolidation. No pleural effusion. No pneumothorax. No acute osseous abnormality. IMPRESSION: 1. No acute cardiopulmonary disease. 2. Superior mediastinal irregular contour best evaluated on CT angio chest  07/17/2024. Electronically Signed   By: Morgane  Naveau M.D.   On: 05/23/2024 22:11   NM PET Image Initial (PI) Skull Base To Thigh (F-18 FDG) Result Date: 05/21/2024 CLINICAL DATA:  Initial treatment strategy for non-small-cell carcinoma. EXAM: NUCLEAR MEDICINE PET SKULL BASE TO THIGH TECHNIQUE: 5.61 mCi F-18 FDG was injected intravenously. Full-ring PET imaging was performed from the skull base to thigh after the radiotracer. CT data was obtained and used for attenuation correction and anatomic localization. Fasting blood glucose: 146 mg/dl COMPARISON:  CTA chest 05/17/2024 and older FINDINGS: Mediastinal blood pool activity: SUV max 2.0 Liver activity: SUV max 2.2 NECK: Abnormal lymph nodes are identified in the low right neck, supraclavicular region. At least 3 nodes are seen. Largest is seen with maximum SUV value of 7.6 and dimension on image 36 measuring 19 by 17 mm. No other areas of abnormal nodal uptake in the left side of the neck. There is asymmetric uptake along the right vocal cord compared to left. Please correlate for any focal cord abnormality. Incidental CT findings: Small thyroid gland. The submandibular glands and parotid glands are grossly  preserved. Please see separate taken of MRI of the brain from 05/17/2024. Paranasal sinuses and mastoid air cells are clear. Is some streak artifact from the dental hardware. Scattered vascular calcifications. CHEST: Extensive hypermetabolic lymph nodes identified throughout the mediastinum. Distribution includes internal mammary chain on the right, prevascular, paratracheal, subcarinal, left thoracic inlet and chest wall and paraesophageal. Additional abnormal nodes seen in both hila. Specific examples include the large confluent mass upper right mediastinum maximum SUV of 9.1. This area in total on image 47 measures 5.1 x 4.8 cm. These and other nodes displace mediastinal structures and there is slight narrowing of the SVC along its course. Confluent area  along left side of the upper mediastinum the level of the arch has maximum SUV of 7.7 and again is likely confluence of multiple nodes with the entirety measured on image 55 at 6.6 by 2.7 cm. The right hilar node has maximum SUV approaching 5.3 on image 65. Left hilar area on image 65 has maximum SUV of 6.0. There is a small nodule in the right lower lobe which is centrally cavitary on image 69 measuring 12 by 8 mm. This has only minimal uptake of maximum SUV of 1.2. No specific areas of abnormal lung uptake at this time. Please correlate history. Incidental CT findings: Advanced centrilobular emphysematous lung changes are identified. No pneumothorax or effusion. There is some linear opacity the bases likely scar or atelectasis. There is motion. Significant areas of debris or filling defects along the bilateral bronchi centrally, right-greater-than-left, bronchus intermedius. Heart is nonenlarged. Small pericardial effusion. Scattered vascular calcifications are seen including the coronary arteries. Slightly patulous esophagus. Please correlate with the recent CT scan of the chest from 05/17/2024. Once again arterial stents identified along the great vessels, brachiocephalic artery with extension into the common and right subclavian artery. ABDOMEN/PELVIS: Physiologic distribution radiotracer along the parenchymal organs, bowel and renal collecting systems. No specific abnormal nodal uptake in the abdomen and pelvis as well. Incidental CT findings: Hyperdense left-sided lower pole renal cystic foci again seen. Please correlate with previous CT and MRI evaluations. Diffuse vascular calcifications. Some high attenuation material along the large bowel is again seen. The large bowel is nondilated with scattered stool. The stomach and small bowel are nondilated. Spleen, adrenal glands and liver are grossly preserved. Gallbladder is nondilated. SKELETON: There is a focus of increased uptake with maximum SUV of 3.7 on  image 127. This is along the left iliac bone. Faint area of sclerosis identified in this location image 126 of the CT. An isolated bony metastatic focus is possible. Please correlate for symptoms and either short follow-up versus additional workup. No other areas of abnormal uptake along the skeleton Incidental CT findings: Spine.  Scattered degenerative changes. IMPRESSION: Extensive hypermetabolic nodal enlargement throughout the mediastinum, hilum and also thoracic inlet, right supraclavicular and left chest wall. Small right-sided lower lobe lung nodule identified with no abnormal uptake. Please correlate with history. Attention on follow-up. Filling defects along the central bronchi of the lungs particularly the bronchus intermedius. Please correlate with history. Small focus of increased uptake in the left iliac bone relative to other areas of marrow. A subtle isolated bony metastasis is in the differential. Further workup for tension on follow-up as clinically directed. Asymmetric vocal cord uptake.  Please correlate for function. Please correlate with multiple prior CT and MRI examinations including brain MRI. Electronically Signed   By: Ranell Bring M.D.   On: 05/21/2024 16:53   DG Chest Port 1 View Result Date:  05/20/2024 CLINICAL DATA:  sob EXAM: PORTABLE CHEST - 1 VIEW COMPARISON:  May 17, 2024 FINDINGS: Emphysema. No focal airspace consolidation, pleural effusion, or pneumothorax. No cardiomegaly. Unchanged thickening of the right paratracheal stripe and AP window. No acute fracture or destructive lesions. Multilevel thoracic osteophytosis. IMPRESSION: 1. Emphysema.  No acute cardiopulmonary abnormality. 2. Unchanged masslike thickening within the right paratracheal and AP window regions, correlating to the lymphadenopathy or confluent soft tissue noted on the prior CT. Electronically Signed   By: Rogelia Myers M.D.   On: 05/20/2024 17:11   MR BRAIN W WO CONTRAST Result Date: 05/17/2024 CLINICAL  DATA:  Initial evaluation for metastatic disease. EXAM: MRI HEAD WITHOUT AND WITH CONTRAST TECHNIQUE: Multiplanar, multiecho pulse sequences of the brain and surrounding structures were obtained without and with intravenous contrast. CONTRAST:  5mL GADAVIST  GADOBUTROL  1 MMOL/ML IV SOLN COMPARISON:  None Available. FINDINGS: Brain: Mild age-related cerebral atrophy. Patchy T2/FLAIR hyperintensity involving the periventricular deep white matter both cerebral hemispheres, most like related chronic microvascular ischemic disease, moderately advanced in nature. No evidence for acute or subacute ischemia. No areas of chronic cortical infarction. No acute intracranial hemorrhage. 1.2 cm rim enhancing lesion positioned along the gray-white matter differentiation of the left temporal occipital junction (series 17, image 57). Mild localized vasogenic edema without significant regional mass effect. Associated susceptibility artifact consistent with blood products and/or necrosis. Finding suspicious for a small metastatic lesion given provided history. There is question of an additional 5 mm focus of enhancement at the superior right cerebellum (series 18, image 8). Associated susceptibility artifact at this location (series 10, image 12). Finding is nonspecific, and could reflect a small additional metastatic lesion or possibly small vascular lesion. No other visible intracranial metastases. No other pathologic enhancement. Ventricles normal size without hydrocephalus. No extra-axial fluid collection. Pituitary gland and suprasellar region within normal limits. Vascular: Major intracranial vascular flow voids are maintained. Skull and upper cervical spine: Craniocervical junction within normal limits. Bone marrow signal intensity normal. No scalp soft tissue abnormality. Sinuses/Orbits: Globes and orbital soft tissues within normal limits. Paranasal sinuses are largely clear. Trace left mastoid effusion noted, of doubtful  significance. Other: None. IMPRESSION: 1. 1.2 cm rim enhancing lesion positioned at the left temporoccipital junction, suspicious for a metastatic lesion given provided history. Mild localized edema without significant regional mass effect. 2. Question additional 5 mm focus of enhancement at the superior right cerebellum, nonspecific, and could reflect an additional metastasis or possibly a small vascular lesion. Attention at follow-up recommended. 3. No other acute intracranial abnormality. 4. Underlying age-related cerebral atrophy with moderately advanced chronic microvascular ischemic disease. Electronically Signed   By: Morene Hoard M.D.   On: 05/17/2024 21:08   DG Chest Portable 1 View Result Date: 05/17/2024 CLINICAL DATA:  Chest pain. EXAM: PORTABLE CHEST 1 VIEW COMPARISON:  Radiographs 04/26/2024 and 12/14/2021. Chest CT 04/21/2024. FINDINGS: 1509 hours. Previously demonstrated right paratracheal adenopathy may be slightly progressive. The heart size is stable. There are right subclavian and right common carotid vascular stents. Pulmonary nodularity seen by CT is not well visualized. The lungs appear clear. No evidence of pleural effusion or pneumothorax. The bones appear unchanged. IMPRESSION: 1. No evidence of acute cardiopulmonary process. 2. Possible slight progression of known right paratracheal adenopathy, remaining suspicious for malignancy. Electronically Signed   By: Elsie Perone M.D.   On: 05/17/2024 16:46   CT ANGIO CHEST PE W OR WO CONTRAST Result Date: 05/17/2024 CLINICAL DATA:  Pulmonary embolism (PE) suspected, high prob. EXAM:  CT ANGIOGRAPHY CHEST WITH CONTRAST TECHNIQUE: Multidetector CT imaging of the chest was performed using the standard protocol during bolus administration of intravenous contrast. Multiplanar CT image reconstructions and MIPs were obtained to evaluate the vascular anatomy. RADIATION DOSE REDUCTION: This exam was performed according to the departmental  dose-optimization program which includes automated exposure control, adjustment of the mA and/or kV according to patient size and/or use of iterative reconstruction technique. CONTRAST:  75mL OMNIPAQUE  IOHEXOL  350 MG/ML SOLN COMPARISON:  CT scan chest from 04/21/2024. FINDINGS: Cardiovascular: There is small-to-moderate volume predominantly nonocclusive embolism involving the segmental and proximal subsegmental pulmonary artery branches throughout bilateral lungs (marked with electronic arrow sign on series 4). There is no right heart strain or lung infarction. Normal cardiac size. Physiological pericardial effusion. No aortic aneurysm. There are coronary artery calcifications, in keeping with coronary artery disease. There are also mild peripheral atherosclerotic vascular calcifications of thoracic aorta and its major branches. Mediastinum/Nodes: Visualized thyroid gland appears grossly unremarkable. 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 The mass also abuts ascending aorta, aortic arch and descending thoracic aorta. The fat planes between the mass and the thoracic esophagus are also lost. The esophagus is nondistended precluding optimal assessment. There is mild circumferential thickening of the mid/lower thoracic esophagus, which is most likely seen in the settings of chronic gastroesophageal reflux disease versus esophagitis. No axillary lymphadenopathy by size criteria. Lungs/Pleura: The trachea and left bronchial tree are patent. Redemonstration of an approximately 8 x 17 mm soft tissue nodular thickening along the left wall of the bronchus intermedius, which is similar to the prior study. There are extensive frothy soft tissue in the bronchus intermedius and right lower lobe bronchial tree, favoring mucus/secretions versus aspiration. There are  mild-to-moderate upper lobe predominant centrilobular emphysematous changes. There are patchy areas of linear, plate-like atelectasis and/or scarring throughout bilateral lungs. Redemonstration of several in regular solid noncalcified nodules in bilateral lungs, described as follows: *Linear configuration 4 x 19 mm nodule in the left lung upper lobe (series 12, image 34), unchanged since the prior study. *There is a slightly spiculated 7 x 12 mm nodule in the right lung lower lobe (series 12, image 91), unchanged since the prior study. *There are additional stable subcentimeter sized nodularity in the right upper lobe (series 12, images 62 and 78), unchanged since the prior study. No new or suspicious lung nodule. No new mass or consolidation. No pleural effusion or pneumothorax. Upper Abdomen: There is infiltrating soft tissue surrounding aorta in the visualized portion of upper abdomen which is also grossly similar to the prior study. Remaining visualized upper abdominal viscera within normal limits. Musculoskeletal: The visualized soft tissues of the chest wall are grossly unremarkable. No suspicious osseous lesions. There are mild multilevel degenerative changes in the visualized spine. L1 superior endplate Schmorl's node again seen. Review of the MIP images confirms the above findings. IMPRESSION: 1. There is small-to-moderate volume predominantly nonocclusive embolism involving the segmental and proximal subsegmental pulmonary artery branches throughout bilateral lungs. No right heart strain or lung infarction. 2. Redemonstration of extensive mediastinal soft tissue as well as soft tissue surrounding aorta in the visualized portion of upper abdomen which are grossly similar to the prior study. Findings favor lymphoma versus other metastatic disease. 3. There are extensive frothy soft tissue in the bronchus intermedius and right lower lobe bronchial tree, favoring mucus/secretions versus aspiration. Stable soft  tissue nodular  thickening along the left wall of the bronchus intermedius. 4. Stable pre-existing lung nodules, as described above. 5. Multiple other nonacute observations, as described above. Aortic Atherosclerosis (ICD10-I70.0) and Emphysema (ICD10-J43.9). Critical Value/emergent results were called by telephone at the time of interpretation on 05/17/2024 at 4:34 pm to provider Dr. Rocky Massy, who verbally acknowledged these results. Electronically Signed   By: Ree Molt M.D.   On: 05/17/2024 16:36    Microbiology: Results for orders placed or performed during the hospital encounter of 06/01/24  Culture, blood (routine x 2)     Status: None (Preliminary result)   Collection Time: 06/01/24  5:08 PM   Specimen: BLOOD  Result Value Ref Range Status   Specimen Description   Final    BLOOD RIGHT ANTECUBITAL Performed at [facility], [address] [place], [place] [address]    Special Requests   Final    BOTTLES DRAWN AEROBIC AND ANAEROBIC Blood Culture results may not be optimal due to an inadequate volume of blood received in culture bottles Performed at [facility], [address], [place], [place] [address]    Culture   Final    NO GROWTH 3 DAYS Performed at [facility], [address], [place], [place] [address]    Report Status PENDING  Incomplete  Culture, blood (routine x 2)     Status: None (Preliminary result)   Collection Time: 06/01/24  5:15 PM   Specimen: BLOOD  Result Value Ref Range Status   Specimen Description   Final    BLOOD LEFT ANTECUBITAL Performed at [facility], [address], [place], [place] [address]    Special Requests   Final    BOTTLES DRAWN AEROBIC AND ANAEROBIC Blood Culture results may not be optimal due to an inadequate volume of blood received in culture bottles Performed at [facility], [address], [place], [place] [address]    Culture   Final    NO  GROWTH 3 DAYS Performed at [facility], [address], [place], [place] [address]    Report Status PENDING  Incomplete  Respiratory (~20 pathogens) panel by PCR     Status: None   Collection Time: 06/01/24  8:02 PM   Specimen: Nasopharyngeal Swab; Respiratory  Result Value Ref Range Status   Adenovirus NOT DETECTED NOT DETECTED Final   Coronavirus 229E NOT DETECTED NOT DETECTED Final    Comment: (NOTE) The Coronavirus on the Respiratory Panel, DOES NOT test for the novel  Coronavirus (2019 nCoV)    Coronavirus HKU1 NOT DETECTED NOT DETECTED Final   Coronavirus NL63 NOT DETECTED NOT DETECTED Final   Coronavirus OC43 NOT DETECTED NOT DETECTED Final   Metapneumovirus NOT DETECTED NOT DETECTED Final   Rhinovirus / Enterovirus NOT DETECTED NOT DETECTED Final   Influenza A NOT DETECTED NOT DETECTED Final   Influenza B NOT DETECTED NOT DETECTED Final   Parainfluenza Virus 1 NOT DETECTED NOT DETECTED Final   Parainfluenza Virus 2 NOT DETECTED NOT DETECTED Final   Parainfluenza Virus 3 NOT DETECTED NOT DETECTED Final   Parainfluenza Virus 4 NOT DETECTED NOT DETECTED Final   Respiratory Syncytial Virus NOT DETECTED NOT DETECTED Final   Bordetella pertussis NOT DETECTED NOT DETECTED Final   Bordetella Parapertussis NOT DETECTED NOT DETECTED Final   Chlamydophila pneumoniae NOT DETECTED NOT DETECTED Final   Mycoplasma pneumoniae NOT DETECTED NOT DETECTED Final    Comment: Performed at [facility],  [address] [place], [place] [address]    Labs: CBC: Recent Labs  Lab 06/01/24 1523 06/02/24 0502 06/03/24 0445  WBC 6.6 4.2 7.9  NEUTROABS 5.2  --   --   HGB 11.6* 9.3* 9.1*  HCT 38.4 31.2* 29.8*  MCV 85.1 87.4 85.4  PLT 498* 394 385   Basic Metabolic Panel: Recent Labs  Lab 06/01/24 1523 06/02/24 0502 06/03/24 0445  NA 139 133* 136  K 4.4 4.4 4.1  CL 102 101 103  CO2 22 22 22   GLUCOSE 135* 124* 95  BUN 20 21 18   CREATININE 1.18* 0.71 0.86  CALCIUM  10.7*  9.1 9.3   Liver Function Tests: Recent Labs  Lab 06/01/24 1523 06/02/24 0502 06/03/24 0445  AST 15 12* 13*  ALT 14 12 11   ALKPHOS 99 76 75  BILITOT 0.7 0.5 0.3  PROT 8.2* 6.9 6.3*  ALBUMIN  3.6 2.8* 2.7*   CBG: No results for input(s): GLUCAP in the last 168 hours.  Discharge time spent: less than 30 minutes.  Signed: Garnette Pelt, MD [facility] 06/04/2024

## 2024-06-04 NOTE — Progress Notes (Signed)
 Daily Progress Note   Patient Name: Kristina Howe       Date: 06/04/2024 DOB: 23-Feb-1961  Age: 63 y.o. MRN#: [id number] Attending Physician: Cindy Garnette POUR, MD Primary Care Physician: [name] BROCKS, FNP Admit Date: 06/01/2024  Reason for Consultation/Follow-up: Establishing goals of care and Non pain symptom management  Subjective: No distress, resting comfortably.   Length of Stay: 3  Current Medications: Scheduled Meds:  . arformoterol   15 mcg Nebulization BID  . atorvastatin   40 mg Oral Daily  . azithromycin   500 mg Oral q1800  . budesonide  (PULMICORT ) nebulizer solution  2 mg Nebulization Q12H  . clonazePAM   0.5 mg Oral BID  . enoxaparin   1 mg/kg Subcutaneous Q12H  . ferrous sulfate   325 mg Oral Q breakfast  . FLUoxetine   20 mg Oral Daily  . guaiFENesin   600 mg Oral BID  . predniSONE   40 mg Oral Q breakfast    Continuous Infusions: . cefTRIAXone  (ROCEPHIN )  IV 2 g (06/03/24 1631)    PRN Meds: acetaminophen  **OR** acetaminophen , ALPRAZolam , HYDROmorphone  (DILAUDID ) injection, LORazepam , ondansetron  **OR** ondansetron  (ZOFRAN ) IV, mouth rinse, oxyCODONE , polyethylene glycol  Physical Exam         Awake alert No distress Regular work of breathing  S 1 S 2   Vital Signs: BP (!) 144/87 (BP Location: Left Arm)   Pulse 75   Temp 98.3 F (36.8 C) (Oral)   Resp 18   Ht 5' 7 (1.702 m)   Wt 43.8 kg   SpO2 96%   BMI 15.12 kg/m  SpO2: SpO2: 96 % O2 Device: O2 Device: Nasal Cannula O2 Flow Rate: O2 Flow Rate (L/min): (S) 4 L/min (Weaned to 3 L [place].)  Intake/output summary:  Intake/Output Summary (Last 24 hours) at 06/04/2024 1131 Last data filed at 06/04/2024 0900 Gross per 24 hour  Intake 160 ml  Output --  Net 160 ml   LBM: Last BM Date : 06/01/24 Baseline  Weight: Weight: 43.8 kg Most recent weight: Weight: 43.8 kg       Palliative Assessment/Data:      Patient Active Problem List   Diagnosis Date Noted  . Acute respiratory failure with hypoxia (HCC) 06/01/2024  . Hypoxemia 05/17/2024  . 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 Mediastinal adenopathy  04/16/2024  . Pulmonary nodules 04/16/2024  . Acute pulmonary embolism (HCC) 04/16/2024  . 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 Tobacco use 04/16/2024  . 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 Now admitted from [facility] on 6/24 due to shortness of breath. Follows with Dr. Mohamed/Medical Oncology.   Assessment:  Non-small cell lung cancer with mets to brain bone and lymph nodes Shortness of breath Anxiety  Recommendations/Plan: Continue current pain and non-- pain symptom management Continue current anxiolytic regimen Recommend palliative follow-up at [facility] for ongoing symptom management and for additional support    Code Status:    Code Status Orders  (From admission, onward)           Start     Ordered   06/01/24 1809  Do not attempt resuscitation (DNR)- Limited -Do Not Intubate (DNI)  Continuous       Question Answer Comment  If pulseless and not breathing No CPR or chest compressions.   In Pre-Arrest Conditions (Patient Is Breathing and Has A Pulse) Do not intubate. Provide all appropriate non-invasive medical interventions. Avoid ICU transfer unless indicated or required.   Consent: Discussion documented in EHR or advanced directives reviewed      06/01/24 1812           Code Status History     Date Active Date Inactive Code Status Order ID Comments User Context   05/17/2024 1743 05/18/2024 1737 Full Code  [id number]  Raenelle Coria, MD ED   03/07/2024 2007 03/12/2024 1445 Full Code [id number]  Keturah Carrier, MD Inpatient       Prognosis:  Unable to determine  Discharge Planning: Home with Palliative Services  Care plan was discussed with  IDT Thank you for allowing the Palliative Medicine Team to assist in the care of this patient.  low MDM.      Greater than 50%  of this time was spent counseling and coordinating care related to the above assessment and plan.  Lonia Serve, MD  Please contact Palliative Medicine Team phone at [phone] for questions and concerns.

## 2024-06-04 NOTE — Progress Notes (Signed)
 I received a consult to provide emotional and spiritual support to [place]. She is grieving the fact that she is dying and feels that so much is out of control.  I attempted to help her think about what was in her control, but she was not able to talk about that right now.  She does not want to leave her boyfriend or her son and grandchildren.  She also does not want to put them through this.  I normalized this emotional response, but also reminded her that she is not putting them through this but that they are experiencing it together.    She has significant anxiety and has taken medication for anxiety since her mother's death in the late 1990s.  She feels that it is no longer working for her.  I spoke with her nurse letting him know this and that she is requesting medication now if possible.  She stated that talking about the situation makes it worse right now.  I asked if she would want anything to help distract her at the moment (crossword puzzles etc) but she declined.

## 2024-06-04 NOTE — Plan of Care (Signed)

## 2024-06-06 LAB — CULTURE, BLOOD (ROUTINE X 2)
Culture: NO GROWTH
Culture: NO GROWTH

## 2024-06-07 ENCOUNTER — Other Ambulatory Visit: Payer: Self-pay

## 2024-06-07 ENCOUNTER — Other Ambulatory Visit

## 2024-06-07 ENCOUNTER — Ambulatory Visit
Admission: RE | Admit: 2024-06-07 | Discharge: 2024-06-07 | Disposition: A | Discharge status: Home / Self Care | Source: Ambulatory Visit | Attending: Radiation Oncology | Admitting: Radiation Oncology

## 2024-06-07 ENCOUNTER — Telehealth: Payer: Self-pay | Admitting: Internal Medicine

## 2024-06-07 ENCOUNTER — Ambulatory Visit: Admitting: Internal Medicine

## 2024-06-07 DIAGNOSIS — Z87891 Personal history of nicotine dependence: Secondary | ICD-10-CM | POA: Diagnosis not present

## 2024-06-07 DIAGNOSIS — Z510 Encounter for antineoplastic radiation therapy: Secondary | ICD-10-CM | POA: Diagnosis not present

## 2024-06-07 DIAGNOSIS — C3431 Malignant neoplasm of lower lobe, right bronchus or lung: Secondary | ICD-10-CM | POA: Diagnosis not present

## 2024-06-07 DIAGNOSIS — C7931 Secondary malignant neoplasm of brain: Secondary | ICD-10-CM | POA: Diagnosis not present

## 2024-06-07 LAB — RAD ONC ARIA SESSION SUMMARY
Course Elapsed Days: 5
Plan Fractions Treated to Date: 3
Plan Prescribed Dose Per Fraction: 9 Gy
Plan Total Fractions Prescribed: 3
Plan Total Prescribed Dose: 27 Gy
Reference Point Dosage Given to Date: 27 Gy
Reference Point Session Dosage Given: 9 Gy
Session Number: 3

## 2024-06-07 NOTE — Telephone Encounter (Signed)
 Patient called back in to check the status of this request. States she is out of medication

## 2024-06-07 NOTE — Telephone Encounter (Signed)
 Left a voicemail informing her that Dr. Sherrod wanted to see her sooner and gave her the rescheduled appointment details.

## 2024-06-07 NOTE — Telephone Encounter (Signed)
 Copied from CRM #[id number]. Topic: Medication/Rx - Rx Refill >> Jun 07, 2024 11:45 AM Wyline GRADE wrote: Marquette Brinda Focht is calling for medication request (Obtain: Medication Name, Dosage, Quantity, Frequency, Quantity Requested (example: 30-day supply.)   Include all details related to the request(s) below: Patient requests a refill on Xanax  0.5 mg.     Confirm and type the Best Contact Number below:  Patient/caller contact number:    [phone]         [] Home  [x] Mobile  [] Work [] Other   [x] Okay to leave a voicemail   Medication List:  Current Outpatient Medications:  .  albuterol  HFA (PROVENTIL  HFA;VENTOLIN  HFA;PROAIR  HFA) 90 mcg/actuation inhaler, Inhale 2 puffs every 6 (six) hours as needed for wheezing or shortness of breath., Disp: , Rfl:  .  apixaban  (ELIQUIS ) 5 mg (74 tabs) DsPk dose pack, Take 5 mg by mouth., Disp: , Rfl:  .  atorvastatin  (LIPITOR) 40 mg tablet, , Disp: , Rfl: 0 .  enoxaparin  (LOVENOX ) 60 mg/0.6 mL syrg, Inject 50 mg under the skin., Disp: , Rfl:  .  FLUoxetine  (PROzac ) 20 mg capsule, Take one capsule (20 mg total) by mouth daily., Disp: 90 capsule, Rfl: 3 .  losartan (COZAAR) 100 mg tablet, Take 100 mg by mouth Once Daily., Disp: , Rfl:  .  miscellaneous medical supply misc, PORTABLE OXYGEN TANK for Dx: HYPOXIA, Disp: 1 each, Rfl: 0 .  naloxone (NARCAN) 4 mg/actuation spry nasal spray, Spray the contents of 1 device into 1 nostril. Call 911. May repeat with 2nd device in alternate nostril if no response in 2-3 minutes., Disp: 2 each, Rfl: 0 .  NIFEdipine  (ADALAT  CC) 30 mg ER tablet, Take 30 mg by mouth daily., Disp: , Rfl:  .  oxyCODONE  (ROXICODONE ) 5 mg immediate release tablet, , Disp: , Rfl:  .  triamterene -hydroCHLOROthiazide  (DYAZIDE ) 37.5-25 mg per capsule, Take 1 capsule by mouth daily., Disp: 90 capsule, Rfl: 1     Medication Request/Refills: Pharmacy Information (if applicable)   [] Not Applicable       [x]  Pharmacy listed  [facility]  [facility] [address]                                                 [] Pharmacy not listed (added to pharmacy list in Epic) Send Medication Request to:      [facility]ed [id number] [place] - [address] - PHONE: [phone] - FAX: [phone] [id number] [place], [place] - [address] AT [facility] [address] - PHONE: [phone] - FAX: [phone] [id number] [place], [place] - [address] AT [facility] OF [address] & [facility] [address] - PHONE: [phone] - FAX: [phone] [facility] [id number] - [place], [place] - [address] - PHONE: [phone] - FAX: [phone]

## 2024-06-07 NOTE — Progress Notes (Signed)
 Nurse monitoring complete status post of 3-3 SRS treatments. Patient without complaints. Patient denies new or worsening neurologic symptoms. Vitals stable. Instructed patient to avoid strenuous activity for the next 24 hours. Instructed patient to call [phone] with needs related to treatment after hours or over the weekend. Patient verbalized understanding.  Patient denies any Decadron  at this time.   **pt name** rested with us  for 15 minutes following SRS treatment.  Patient denies headache, dizziness, nausea, diplopia or ringing in the ears. Denies fatigue. Patient without complaints. Understands to avoid strenuous activity for the next 24 hours and call [phone] with needs  This concludes the interaction.  Rosaline Minerva, LPN

## 2024-06-07 NOTE — Telephone Encounter (Signed)
 Left the patient a voicemail with the rescheduled appointment details.

## 2024-06-08 NOTE — Radiation Completion Notes (Addendum)
  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 likely arising in the right lower lobe but involving the right bronchus intermedius and left mainstem and regional lymph nodes with at least a solitary metastatic to brain lesion   Intent: Palliative     ==========DELIVERED PLANS==========  First Treatment Date: 2024-06-02 Last Treatment Date: 2024-06-07   Plan Name: Brain_SRT Site: Brain PTV_1_TemporalLt_78mm PTV_2_CerebellR_31mm PTV_3_CerebellR_43mm PTV_4_CerebellR_81mm PTV_5_CerebellR_2.82mm Technique: SBRT/SRT-IMRT Mode: Photon Dose Per Fraction: 9 Gy Prescribed Dose (Delivered / Prescribed): 27 Gy / 27 Gy Prescribed Fxs (Delivered / Prescribed): 3 / 3     ==========ON TREATMENT VISIT DATES========== 2024-06-02, 2024-06-04, 2024-06-07   See weekly On Treatment Notes in Epic for details in the Media tab (listed as Progress notes on the On Treatment Visit Dates listed above). The patient tolerated radiation.   The patient will receive a call in about one month from the radiation oncology department. She will continue follow up with routine surveillance of her brain in our brain oncology program as well as with Dr. Sherrod as well.      Donald KYM Husband, PAC

## 2024-06-09 ENCOUNTER — Other Ambulatory Visit: Payer: Self-pay | Admitting: Vascular Surgery

## 2024-06-09 ENCOUNTER — Other Ambulatory Visit: Payer: Self-pay

## 2024-06-09 DIAGNOSIS — C3431 Malignant neoplasm of lower lobe, right bronchus or lung: Secondary | ICD-10-CM

## 2024-06-10 ENCOUNTER — Ambulatory Visit: Admitting: Internal Medicine

## 2024-06-10 ENCOUNTER — Inpatient Hospital Stay

## 2024-06-10 ENCOUNTER — Telehealth: Payer: Self-pay | Admitting: Internal Medicine

## 2024-06-10 ENCOUNTER — Inpatient Hospital Stay: Admitting: Dietician

## 2024-06-10 DIAGNOSIS — C3412 Malignant neoplasm of upper lobe, left bronchus or lung: Secondary | ICD-10-CM | POA: Insufficient documentation

## 2024-06-10 DIAGNOSIS — R079 Chest pain, unspecified: Secondary | ICD-10-CM | POA: Insufficient documentation

## 2024-06-10 DIAGNOSIS — Z9981 Dependence on supplemental oxygen: Secondary | ICD-10-CM | POA: Insufficient documentation

## 2024-06-10 DIAGNOSIS — F419 Anxiety disorder, unspecified: Secondary | ICD-10-CM | POA: Insufficient documentation

## 2024-06-10 DIAGNOSIS — C7931 Secondary malignant neoplasm of brain: Secondary | ICD-10-CM | POA: Insufficient documentation

## 2024-06-10 DIAGNOSIS — C3431 Malignant neoplasm of lower lobe, right bronchus or lung: Secondary | ICD-10-CM | POA: Insufficient documentation

## 2024-06-10 DIAGNOSIS — Z923 Personal history of irradiation: Secondary | ICD-10-CM | POA: Insufficient documentation

## 2024-06-10 NOTE — Telephone Encounter (Signed)
 Rescheduled appointments per the patient not feeling well.

## 2024-06-10 NOTE — Progress Notes (Signed)
 Nutrition Assessment   Reason for Assessment: MST screen for weight loss.    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   She just completed SBRT to her brain and will be followed by Drs Buckley and Sherrod.  Patient had SO with her during the call.  She endorses lack of appetite, nausea, and swallowing issues being barriers to her PO intake.  She has been drinking HP Ensure 2-3 bottles a day and trying to eat soups, apple sauce, ice cream, and pudding.  She has no food allergies, and no other NIS or bowel concerns.  She reports swallowing concern is r/t feeling of foods getting stuck not pain or burning.  She endorses that her lack of appetite could be r/t heightened anxiety since her diagnosis.    Anthropometrics:   Height: 67 Weight: 96.6# UBW: 110-115# BMI: 15.12    NUTRITION DIAGNOSIS: Inadequate PO intake to meet increased nutrient needs, r/t cancer diagnosis, anxiety, nausea, and dysphagia  MALNUTRITION DIAGNOSIS: Suspect severe malnutrition with recent significant weight loss, low BMI, and poor PO intake.   INTERVENTION:  Relayed that nutrition services are wrap around service provided at no charge and encouraged continued communication if experiencing continued weight loss or any nutritional impact symptoms (NIS). Educated on importance of adequate calorie and protein energy intake  with nutrient dense foods when possible to maintain weight/strength. Encouraged speaking with MDs about medication to aid with both her nausea and appetite. Discussed ways to add calories/protein to foods (adding cheese, cooking with butter, creamy sauces/gravy) - shake recipes were emailed Discussed strategies for nausea, encouraged small frequent feeds with lean proteins and bland room temperature foods Suggested switching to a 1.5 calorie/ml oral nutrition supplement  (Ensure plus or Ensure complete).  Encouraged asking at [facility] for coupons.   Emailed Nutrition Tip sheet  for  soft moist proteins, nausea, and shake recipes with contact information provided   MONITORING, EVALUATION, GOAL: weight, PO intake, Nutrition Impact Symptoms, labs Goal is 2-4#/month weight gain  Next Visit: remote 2 weeks, will attempt to schedule with RD in-person to coincide with future appointments  Micheline Craven, RDN, LDN Registered Dietitian, [facility] Part Time Remote (Usual office hours: Tuesday-Thursday) Cell: [phone]

## 2024-06-14 ENCOUNTER — Encounter (HOSPITAL_COMMUNITY): Payer: Self-pay

## 2024-06-14 ENCOUNTER — Telehealth: Payer: Self-pay | Admitting: Internal Medicine

## 2024-06-14 ENCOUNTER — Emergency Department (HOSPITAL_COMMUNITY)

## 2024-06-14 ENCOUNTER — Inpatient Hospital Stay (HOSPITAL_COMMUNITY)
Admission: EM | Admit: 2024-06-14 | Discharge: 2024-06-16 | DRG: 189 | Discharge status: Home / Self Care | Attending: Family Medicine | Admitting: Family Medicine

## 2024-06-14 ENCOUNTER — Other Ambulatory Visit: Payer: Self-pay

## 2024-06-14 DIAGNOSIS — J9611 Chronic respiratory failure with hypoxia: Secondary | ICD-10-CM | POA: Diagnosis not present

## 2024-06-14 DIAGNOSIS — R4182 Altered mental status, unspecified: Secondary | ICD-10-CM | POA: Diagnosis not present

## 2024-06-14 DIAGNOSIS — C3431 Malignant neoplasm of lower lobe, right bronchus or lung: Secondary | ICD-10-CM | POA: Diagnosis not present

## 2024-06-14 DIAGNOSIS — D649 Anemia, unspecified: Secondary | ICD-10-CM | POA: Diagnosis not present

## 2024-06-14 DIAGNOSIS — Z9981 Dependence on supplemental oxygen: Secondary | ICD-10-CM

## 2024-06-14 DIAGNOSIS — F419 Anxiety disorder, unspecified: Secondary | ICD-10-CM | POA: Diagnosis not present

## 2024-06-14 DIAGNOSIS — Z923 Personal history of irradiation: Secondary | ICD-10-CM

## 2024-06-14 DIAGNOSIS — J449 Chronic obstructive pulmonary disease, unspecified: Secondary | ICD-10-CM | POA: Diagnosis present

## 2024-06-14 DIAGNOSIS — Z85118 Personal history of other malignant neoplasm of bronchus and lung: Secondary | ICD-10-CM | POA: Diagnosis not present

## 2024-06-14 DIAGNOSIS — I2782 Chronic pulmonary embolism: Secondary | ICD-10-CM | POA: Diagnosis present

## 2024-06-14 DIAGNOSIS — J439 Emphysema, unspecified: Secondary | ICD-10-CM | POA: Diagnosis not present

## 2024-06-14 DIAGNOSIS — E7800 Pure hypercholesterolemia, unspecified: Secondary | ICD-10-CM | POA: Diagnosis present

## 2024-06-14 DIAGNOSIS — C7931 Secondary malignant neoplasm of brain: Secondary | ICD-10-CM | POA: Diagnosis present

## 2024-06-14 DIAGNOSIS — R9082 White matter disease, unspecified: Secondary | ICD-10-CM | POA: Diagnosis not present

## 2024-06-14 DIAGNOSIS — J9620 Acute and chronic respiratory failure, unspecified whether with hypoxia or hypercapnia: Secondary | ICD-10-CM | POA: Diagnosis present

## 2024-06-14 DIAGNOSIS — R54 Age-related physical debility: Secondary | ICD-10-CM | POA: Diagnosis present

## 2024-06-14 DIAGNOSIS — E43 Unspecified severe protein-calorie malnutrition: Secondary | ICD-10-CM | POA: Diagnosis present

## 2024-06-14 DIAGNOSIS — J9621 Acute and chronic respiratory failure with hypoxia: Secondary | ICD-10-CM | POA: Diagnosis not present

## 2024-06-14 DIAGNOSIS — Z515 Encounter for palliative care: Secondary | ICD-10-CM | POA: Diagnosis not present

## 2024-06-14 DIAGNOSIS — C7951 Secondary malignant neoplasm of bone: Secondary | ICD-10-CM | POA: Diagnosis present

## 2024-06-14 DIAGNOSIS — Z681 Body mass index (BMI) 19 or less, adult: Secondary | ICD-10-CM

## 2024-06-14 DIAGNOSIS — Z7189 Other specified counseling: Secondary | ICD-10-CM | POA: Diagnosis not present

## 2024-06-14 DIAGNOSIS — Z95828 Presence of other vascular implants and grafts: Secondary | ICD-10-CM

## 2024-06-14 DIAGNOSIS — R0602 Shortness of breath: Secondary | ICD-10-CM | POA: Diagnosis not present

## 2024-06-14 DIAGNOSIS — J9601 Acute respiratory failure with hypoxia: Principal | ICD-10-CM

## 2024-06-14 DIAGNOSIS — G9341 Metabolic encephalopathy: Secondary | ICD-10-CM | POA: Diagnosis present

## 2024-06-14 DIAGNOSIS — J9811 Atelectasis: Secondary | ICD-10-CM | POA: Diagnosis not present

## 2024-06-14 DIAGNOSIS — C3490 Malignant neoplasm of unspecified part of unspecified bronchus or lung: Secondary | ICD-10-CM | POA: Diagnosis not present

## 2024-06-14 DIAGNOSIS — R591 Generalized enlarged lymph nodes: Secondary | ICD-10-CM | POA: Diagnosis not present

## 2024-06-14 DIAGNOSIS — I2699 Other pulmonary embolism without acute cor pulmonale: Secondary | ICD-10-CM

## 2024-06-14 DIAGNOSIS — I700 Atherosclerosis of aorta: Secondary | ICD-10-CM | POA: Diagnosis present

## 2024-06-14 DIAGNOSIS — Z7901 Long term (current) use of anticoagulants: Secondary | ICD-10-CM

## 2024-06-14 DIAGNOSIS — I10 Essential (primary) hypertension: Secondary | ICD-10-CM | POA: Diagnosis present

## 2024-06-14 DIAGNOSIS — R64 Cachexia: Secondary | ICD-10-CM | POA: Diagnosis present

## 2024-06-14 DIAGNOSIS — C771 Secondary and unspecified malignant neoplasm of intrathoracic lymph nodes: Secondary | ICD-10-CM | POA: Diagnosis present

## 2024-06-14 DIAGNOSIS — Z79899 Other long term (current) drug therapy: Secondary | ICD-10-CM

## 2024-06-14 DIAGNOSIS — I731 Thromboangiitis obliterans [Buerger's disease]: Secondary | ICD-10-CM | POA: Diagnosis present

## 2024-06-14 DIAGNOSIS — R590 Localized enlarged lymph nodes: Secondary | ICD-10-CM | POA: Diagnosis not present

## 2024-06-14 DIAGNOSIS — Z87891 Personal history of nicotine dependence: Secondary | ICD-10-CM

## 2024-06-14 DIAGNOSIS — D638 Anemia in other chronic diseases classified elsewhere: Secondary | ICD-10-CM | POA: Diagnosis present

## 2024-06-14 LAB — COMPREHENSIVE METABOLIC PANEL WITH GFR
ALT: 11 U/L (ref 0–44)
AST: 19 U/L (ref 15–41)
Albumin: 2.9 g/dL — ABNORMAL LOW (ref 3.5–5.0)
Alkaline Phosphatase: 65 U/L (ref 38–126)
Anion gap: 15 (ref 5–15)
BUN: 9 mg/dL (ref 8–23)
CO2: 20 mmol/L — ABNORMAL LOW (ref 22–32)
Calcium: 10 mg/dL (ref 8.9–10.3)
Chloride: 99 mmol/L (ref 98–111)
Creatinine, Ser: 0.74 mg/dL (ref 0.44–1.00)
GFR, Estimated: >60 mL/min (ref >60)
Glucose, Bld: 96 mg/dL (ref 70–99)
Potassium: 3.9 mmol/L (ref 3.5–5.1)
Sodium: 134 mmol/L — ABNORMAL LOW (ref 135–145)
Total Bilirubin: 0.6 mg/dL (ref 0.0–1.2)
Total Protein: 7.4 g/dL (ref 6.5–8.1)

## 2024-06-14 LAB — URINALYSIS, ROUTINE W REFLEX MICROSCOPIC
Bilirubin Urine: NEGATIVE
Glucose, UA: NEGATIVE mg/dL
Hgb urine dipstick: NEGATIVE
Ketones, ur: 20 mg/dL — AB
Leukocytes,Ua: NEGATIVE
Nitrite: NEGATIVE
Protein, ur: NEGATIVE mg/dL
Specific Gravity, Urine: 1.042 — ABNORMAL HIGH (ref 1.005–1.030)
pH: 7 (ref 5.0–8.0)

## 2024-06-14 LAB — ETHANOL: Alcohol, Ethyl (B): <15 mg/dL (ref <15)

## 2024-06-14 LAB — I-STAT VENOUS BLOOD GAS, ED
Acid-base deficit: 1 mmol/L (ref 0.0–2.0)
Bicarbonate: 22.1 mmol/L (ref 20.0–28.0)
Calcium, Ion: 1.21 mmol/L (ref 1.15–1.40)
HCT: 33 % — ABNORMAL LOW (ref 36.0–46.0)
Hemoglobin: 11.2 g/dL — ABNORMAL LOW (ref 12.0–15.0)
O2 Saturation: 99 %
Potassium: 3.6 mmol/L (ref 3.5–5.1)
Sodium: 134 mmol/L — ABNORMAL LOW (ref 135–145)
TCO2: 23 mmol/L (ref 22–32)
pCO2, Ven: 30.9 mmHg — ABNORMAL LOW (ref 44–60)
pH, Ven: 7.462 — ABNORMAL HIGH (ref 7.25–7.43)
pO2, Ven: 152 mmHg — ABNORMAL HIGH (ref 32–45)

## 2024-06-14 LAB — TROPONIN I (HIGH SENSITIVITY)
Troponin I (High Sensitivity): 5 ng/L (ref <18)
Troponin I (High Sensitivity): 6 ng/L (ref <18)

## 2024-06-14 LAB — I-STAT CHEM 8, ED
BUN: 10 mg/dL (ref 8–23)
Calcium, Ion: 1.18 mmol/L (ref 1.15–1.40)
Chloride: 101 mmol/L (ref 98–111)
Creatinine, Ser: 0.7 mg/dL (ref 0.44–1.00)
Glucose, Bld: 119 mg/dL — ABNORMAL HIGH (ref 70–99)
HCT: 34 % — ABNORMAL LOW (ref 36.0–46.0)
Hemoglobin: 11.6 g/dL — ABNORMAL LOW (ref 12.0–15.0)
Potassium: 3.7 mmol/L (ref 3.5–5.1)
Sodium: 133 mmol/L — ABNORMAL LOW (ref 135–145)
TCO2: 20 mmol/L — ABNORMAL LOW (ref 22–32)

## 2024-06-14 LAB — AMMONIA: Ammonia: <13 umol/L (ref 9–35)

## 2024-06-14 LAB — CBC WITH DIFFERENTIAL/PLATELET
Abs Immature Granulocytes: 0.02 K/uL (ref 0.00–0.07)
Basophils Absolute: 0 K/uL (ref 0.0–0.1)
Basophils Relative: 1 %
Eosinophils Absolute: 0 K/uL (ref 0.0–0.5)
Eosinophils Relative: 1 %
HCT: 33.7 % — ABNORMAL LOW (ref 36.0–46.0)
Hemoglobin: 10.4 g/dL — ABNORMAL LOW (ref 12.0–15.0)
Immature Granulocytes: 0 %
Lymphocytes Relative: 11 %
Lymphs Abs: 0.5 K/uL — ABNORMAL LOW (ref 0.7–4.0)
MCH: 25.7 pg — ABNORMAL LOW (ref 26.0–34.0)
MCHC: 30.9 g/dL (ref 30.0–36.0)
MCV: 83.2 fL (ref 80.0–100.0)
Monocytes Absolute: 0.4 K/uL (ref 0.1–1.0)
Monocytes Relative: 10 %
Neutro Abs: 3.5 K/uL (ref 1.7–7.7)
Neutrophils Relative %: 77 %
Platelets: 372 K/uL (ref 150–400)
RBC: 4.05 MIL/uL (ref 3.87–5.11)
RDW: 16.6 % — ABNORMAL HIGH (ref 11.5–15.5)
WBC: 4.5 K/uL (ref 4.0–10.5)
nRBC: 0 % (ref 0.0–0.2)

## 2024-06-14 LAB — RAPID URINE DRUG SCREEN, HOSP PERFORMED
Amphetamines: NOT DETECTED
Barbiturates: NOT DETECTED
Benzodiazepines: POSITIVE — AB
Cocaine: NOT DETECTED
Opiates: NOT DETECTED
Tetrahydrocannabinol: POSITIVE — AB

## 2024-06-14 MED ORDER — ALBUTEROL SULFATE (2.5 MG/3ML) 0.083% IN NEBU: 3.0000 mL | INHALATION_SOLUTION | Freq: Four times a day (QID) | RESPIRATORY_TRACT | Status: DC | PRN

## 2024-06-14 MED ORDER — ENOXAPARIN SODIUM 60 MG/0.6ML IJ SOSY
50.0000 mg | PREFILLED_SYRINGE | Freq: Two times a day (BID) | INTRAMUSCULAR | Status: DC
  Administered 2024-06-14 – 2024-06-16 (×4): 50 mg via SUBCUTANEOUS
  Filled 2024-06-14 (×5): qty 0.6

## 2024-06-14 MED ORDER — DROPERIDOL 2.5 MG/ML IJ SOLN
1.2500 mg | Freq: Once | INTRAMUSCULAR | Status: AC
  Administered 2024-06-14: 1.25 mg via INTRAVENOUS
  Filled 2024-06-14: qty 2

## 2024-06-14 MED ORDER — GUAIFENESIN ER 600 MG PO TB12
600.0000 mg | ORAL_TABLET | Freq: Two times a day (BID) | ORAL | Status: DC
  Administered 2024-06-14 – 2024-06-16 (×4): 600 mg via ORAL
  Filled 2024-06-14 (×4): qty 1

## 2024-06-14 MED ORDER — METHYLPREDNISOLONE SODIUM SUCC 125 MG IJ SOLR
125.0000 mg | Freq: Once | INTRAMUSCULAR | Status: AC
  Administered 2024-06-14: 125 mg via INTRAVENOUS
  Filled 2024-06-14: qty 2

## 2024-06-14 MED ORDER — LORAZEPAM 2 MG/ML IJ SOLN
1.0000 mg | Freq: Once | INTRAMUSCULAR | Status: AC
  Administered 2024-06-14: 1 mg via INTRAVENOUS
  Filled 2024-06-14: qty 1

## 2024-06-14 MED ORDER — POLYETHYLENE GLYCOL 3350 17 G PO PACK
17.0000 g | PACK | Freq: Every day | ORAL | Status: DC | PRN
Start: 2024-06-14 — End: 2024-06-16

## 2024-06-14 MED ORDER — IPRATROPIUM-ALBUTEROL 0.5-2.5 (3) MG/3ML IN SOLN: 3.0000 mL | RESPIRATORY_TRACT | Status: DC | PRN

## 2024-06-14 MED ORDER — ACETAMINOPHEN 650 MG RE SUPP: 650.0000 mg | Freq: Four times a day (QID) | RECTAL | Status: DC | PRN

## 2024-06-14 MED ORDER — IOHEXOL 350 MG/ML SOLN
75.0000 mL | Freq: Once | INTRAVENOUS | Status: AC | PRN
  Administered 2024-06-14: 75 mL via INTRAVENOUS

## 2024-06-14 MED ORDER — ALBUTEROL SULFATE (2.5 MG/3ML) 0.083% IN NEBU
10.0000 mg/h | INHALATION_SOLUTION | Freq: Once | RESPIRATORY_TRACT | Status: AC
  Administered 2024-06-14: 10 mg/h via RESPIRATORY_TRACT
  Filled 2024-06-14: qty 12

## 2024-06-14 MED ORDER — ALBUTEROL SULFATE (2.5 MG/3ML) 0.083% IN NEBU
5.0000 mg | INHALATION_SOLUTION | Freq: Once | RESPIRATORY_TRACT | Status: AC
  Administered 2024-06-14: 5 mg via RESPIRATORY_TRACT
  Filled 2024-06-14: qty 6

## 2024-06-14 MED ORDER — SODIUM CHLORIDE 0.9% FLUSH
3.0000 mL | Freq: Two times a day (BID) | INTRAVENOUS | Status: DC
  Administered 2024-06-14 – 2024-06-16 (×5): 3 mL via INTRAVENOUS

## 2024-06-14 MED ORDER — ALPRAZOLAM 0.5 MG PO TABS
1.0000 mg | ORAL_TABLET | Freq: Three times a day (TID) | ORAL | Status: DC | PRN
  Administered 2024-06-14 – 2024-06-16 (×4): 1 mg via ORAL
  Filled 2024-06-14 (×4): qty 2

## 2024-06-14 MED ORDER — ACETAMINOPHEN 325 MG PO TABS
650.0000 mg | ORAL_TABLET | Freq: Four times a day (QID) | ORAL | Status: DC | PRN
Start: 2024-06-14 — End: 2024-06-16

## 2024-06-14 MED ORDER — ATORVASTATIN CALCIUM 40 MG PO TABS
40.0000 mg | ORAL_TABLET | Freq: Every day | ORAL | Status: DC
  Administered 2024-06-15 – 2024-06-16 (×2): 40 mg via ORAL
  Filled 2024-06-14 (×2): qty 1

## 2024-06-14 MED ORDER — IRBESARTAN 300 MG PO TABS
300.0000 mg | ORAL_TABLET | Freq: Every day | ORAL | Status: DC
  Administered 2024-06-15 – 2024-06-16 (×2): 300 mg via ORAL
  Filled 2024-06-14 (×2): qty 1

## 2024-06-14 MED ORDER — IPRATROPIUM BROMIDE 0.02 % IN SOLN
0.5000 mg | Freq: Once | RESPIRATORY_TRACT | Status: AC
  Administered 2024-06-14: 0.5 mg via RESPIRATORY_TRACT
  Filled 2024-06-14: qty 2.5

## 2024-06-14 MED ORDER — SODIUM CHLORIDE 3 % IN NEBU
4.0000 mL | INHALATION_SOLUTION | Freq: Every day | RESPIRATORY_TRACT | Status: DC
  Administered 2024-06-14 – 2024-06-16 (×2): 4 mL via RESPIRATORY_TRACT
  Filled 2024-06-14 (×3): qty 4

## 2024-06-14 MED ORDER — FLUOXETINE HCL 20 MG PO CAPS
20.0000 mg | ORAL_CAPSULE | Freq: Every day | ORAL | Status: DC
  Administered 2024-06-15 – 2024-06-16 (×2): 20 mg via ORAL
  Filled 2024-06-14 (×2): qty 1

## 2024-06-14 NOTE — ED Provider Notes (Addendum)
 [place] EMERGENCY DEPARTMENT AT [facility] Provider Note   CSN: [id number] Arrival date & time: 06/14/24  9258     Patient presents with: Shortness of Breath   Kristina Howe is a 63 y.o. female.   This is a 63 year old female presenting emergency department for shortness of breath.  Has lung cancer and COPD.  Reportedly has had worsening shortness of breath over the past several weeks, certainly acutely worsened last night.  Family members note that recently put on oxygen 2 L.  However they note that she has been requiring more up to 4 L and has been desaturating with ambulation.  No fevers no chills.  Increased cough with sputum production.   Shortness of Breath      Prior to Admission medications   Medication Sig Start Date End Date Taking? Authorizing Provider  albuterol  (VENTOLIN  HFA) 108 (90 Base) MCG/ACT inhaler Inhale 2 puffs into the lungs every 6 (six) hours as needed for wheezing or shortness of breath. Patient taking differently: Inhale 2 puffs into the lungs every 4 (four) hours as needed for wheezing or shortness of breath. 05/18/24  Yes Raenelle Coria, MD  ALPRAZolam  (XANAX ) 1 MG tablet Take 1 tablet (1 mg total) by mouth 3 (three) times daily as needed for anxiety. Patient taking differently: Take 1 mg by mouth 3 (three) times daily. 05/24/24  Yes Pollina, Lonni PARAS, MD  atorvastatin  (LIPITOR) 40 MG tablet Take 1 tablet (40 mg total) by mouth daily. 03/12/24  Yes Lue Elsie BROCKS, MD  enoxaparin  (LOVENOX ) 60 MG/0.6ML injection Inject 0.5 mLs (50 mg total) into the skin every 12 (twelve) hours. 05/18/24  Yes Raenelle Coria, MD  ferrous sulfate  325 (65 FE) MG tablet Take 325 mg by mouth daily with breakfast.   Yes [provider]  FLUoxetine  (PROZAC ) 20 MG capsule Take 20 mg by mouth daily.   Yes [provider]  Multiple Vitamins-Minerals (MULTIVITAMIN WITH MINERALS) tablet Take 1 tablet by mouth daily with breakfast.   Yes [provider]  oxyCODONE  (OXY IR/ROXICODONE ) 5 MG immediate release tablet Take 1 tablet (5 mg total) by mouth every 6 (six) hours as needed for severe pain (pain score 7-10). 05/18/24  Yes Raenelle Coria, MD  triamterene -hydrochlorothiazide  (MAXZIDE -25) 37.5-25 MG per tablet Take 1 tablet by mouth daily.     Yes [provider]  valsartan (DIOVAN) 320 MG tablet Take 320 mg by mouth daily. 12/31/23  Yes [provider]  clonazePAM  (KLONOPIN ) 0.5 MG tablet Take 0.5 mg by mouth 2 (two) times daily. 06/23/24 07/23/24  [provider]    Allergies: Patient has no known allergies.    Review of Systems  Respiratory:  Positive for shortness of breath.     Updated Vital Signs BP 120/82 (BP Location: Left Arm)   Pulse 95   Temp 98.3 F (36.8 C) (Oral)   Resp 16   Ht 5' 7 (1.702 m)   Wt 42.6 kg   SpO2 98%   BMI 14.72 kg/m   Physical Exam Vitals and nursing note reviewed.  Constitutional:      General: She is not in acute distress.    Appearance: She is not toxic-appearing.     Comments: Cachectic, frail-appearing  HENT:     Head: Normocephalic.  Cardiovascular:     Rate and Rhythm: Normal rate and regular rhythm.  Pulmonary:     Effort: Tachypnea present.     Breath sounds: Wheezing present.  Musculoskeletal:  Cervical back: Normal range of motion.     Right lower leg: No edema.     Left lower leg: No edema.  Skin:    General: Skin is warm.     Capillary Refill: Capillary refill takes less than 2 seconds.  Neurological:     Mental Status: She is alert.  Psychiatric:        Mood and Affect: Mood normal.        Behavior: Behavior normal.     (all labs ordered are listed, but only abnormal results are displayed) Labs Reviewed  CBC WITH DIFFERENTIAL/PLATELET - Abnormal; Notable for the following components:      Result Value   Hemoglobin 10.4 (*)    HCT 33.7 (*)    MCH 25.7 (*)    RDW 16.6 (*)    Lymphs Abs 0.5 (*)    All other components  within normal limits  COMPREHENSIVE METABOLIC PANEL WITH GFR - Abnormal; Notable for the following components:   Sodium 134 (*)    CO2 20 (*)    Albumin  2.9 (*)    All other components within normal limits  URINALYSIS, ROUTINE W REFLEX MICROSCOPIC - Abnormal; Notable for the following components:   APPearance HAZY (*)    Specific Gravity, Urine 1.042 (*)    Ketones, ur 20 (*)    All other components within normal limits  RAPID URINE DRUG SCREEN, HOSP PERFORMED - Abnormal; Notable for the following components:   Benzodiazepines POSITIVE (*)    Tetrahydrocannabinol POSITIVE (*)    All other components within normal limits  CBC - Abnormal; Notable for the following components:   RBC 3.60 (*)    Hemoglobin 9.3 (*)    HCT 29.5 (*)    MCH 25.8 (*)    RDW 16.6 (*)    All other components within normal limits  COMPREHENSIVE METABOLIC PANEL WITH GFR - Abnormal; Notable for the following components:   Albumin  3.0 (*)    AST 13 (*)    All other components within normal limits  CBC WITH DIFFERENTIAL/PLATELET - Abnormal; Notable for the following components:   RBC 3.56 (*)    Hemoglobin 9.0 (*)    HCT 29.7 (*)    MCH 25.3 (*)    RDW 16.6 (*)    Lymphs Abs 0.5 (*)    All other components within normal limits  BASIC METABOLIC PANEL WITH GFR - Abnormal; Notable for the following components:   Sodium 134 (*)    All other components within normal limits  I-STAT CHEM 8, ED - Abnormal; Notable for the following components:   Sodium 133 (*)    Glucose, Bld 119 (*)    TCO2 20 (*)    Hemoglobin 11.6 (*)    HCT 34.0 (*)    All other components within normal limits  I-STAT VENOUS BLOOD GAS, ED - Abnormal; Notable for the following components:   pH, Ven 7.462 (*)    pCO2, Ven 30.9 (*)    pO2, Ven 152 (*)    Sodium 134 (*)    HCT 33.0 (*)    Hemoglobin 11.2 (*)    All other components within normal limits  AMMONIA  ETHANOL  TROPONIN I (HIGH SENSITIVITY)  TROPONIN I (HIGH SENSITIVITY)     EKG: EKG Interpretation Date/Time:  Monday June 14 2024 07:58:33 EDT Ventricular Rate:  89 PR Interval:  157 QRS Duration:  87 QT Interval:  371 QTC Calculation: 452 R Axis:   -52  Text  Interpretation: Sinus rhythm Probable left atrial enlargement Left anterior fascicular block Anteroseptal infarct, age indeterminate Confirmed by Jerral Meth [phone]) on 06/15/2024 7:16:02 PM  Radiology: No results found.    .Critical Care  Performed by: Neysa Caron PARAS, DO Authorized by: Neysa Caron PARAS, DO   Critical care provider statement:    Critical care time (minutes):  30   Critical care was necessary to treat or prevent imminent or life-threatening deterioration of the following conditions:  Respiratory failure   Critical care was time spent personally by me on the following activities:  Development of treatment plan with patient or surrogate, discussions with consultants, evaluation of patient's response to treatment, examination of patient, ordering and review of laboratory studies, ordering and review of radiographic studies, ordering and performing treatments and interventions, pulse oximetry, re-evaluation of patient's condition and review of old charts    Medications Ordered in the ED  methylPREDNISolone  sodium succinate (SOLU-MEDROL ) 125 mg/2 mL injection 125 mg (125 mg Intravenous Given 06/14/24 0828)  albuterol  (PROVENTIL ) (2.5 MG/3ML) 0.083% nebulizer solution 5 mg (5 mg Nebulization Given 06/14/24 0829)  ipratropium (ATROVENT ) nebulizer solution 0.5 mg (0.5 mg Nebulization Given 06/14/24 0829)  LORazepam  (ATIVAN ) injection 1 mg (1 mg Intravenous Given 06/14/24 0954)  albuterol  (PROVENTIL ) (2.5 MG/3ML) 0.083% nebulizer solution (10 mg/hr Nebulization Given 06/14/24 1110)  droperidol  (INAPSINE ) 2.5 MG/ML injection 1.25 mg (1.25 mg Intravenous Given 06/14/24 1053)  iohexol  (OMNIPAQUE ) 350 MG/ML injection 75 mL (75 mLs Intravenous Contrast Given 06/14/24 1139)    Clinical Course as of  11-Jul-2024 0709  Mon Jun 14, 2024  0826 PMH significant of pulmonary embolism on Lovenox , primary squamous cell carcinoma of the right lung, metastatic to brain  [TY]  0827 Admitted last month for hypoxia. Also appears to have h/o of breakthrough PE while on eliquis  and placed on lovenox .  [TY]  1004 Remains tachypneic with increased work of breathing.  Desat following nasal cannula, placed on nonrebreather, but keeps pulling at mask.  Placed on high flow nasal cannula.  Does appear to be quite anxious as well, has a history of anxiety and takes Xanax .  Given Ativan . [TY]  1222 Respiratory rate and work of breathing seemingly improved after second hour-long breathing treatment. [TY]  1222 CT Angio Chest PE W and/or Wo Contrast Consulting pulmonology regarding the collapse of lobes  IMPRESSION: 1. Small nonocclusive right upper lobe segmental pulmonary artery embolus similar to prior CT. No new pulmonary embolus. No evidence of right heart straining. 2. Complete occlusion of the bronchus intermedius and right middle and right lower lobe bronchi with complete collapse of the right middle and right lower lobes. 3. Small right pleural effusion. 4. Bulky hilar and mediastinal adenopathy progressed since the prior CT. 5. Aortic Atherosclerosis (ICD10-I70.0) and Emphysema (ICD10-J43.9).   Electronically Signed   By: Vanetta Chou M.D.   On: 06/14/2024 11:55   [TY]  1228 Consulted ICU regarding possible bronchoscopy for CT findings.  They will see and evaluate patient, but recommending medicine admission at this point. [TY]    Clinical Course User Index [TY] Neysa Caron PARAS, DO                                 Medical Decision Making Is a 63 year old female with metastatic lung cancer with chronic hypoxic respiratory failure secondary to lung cancer and COPD.  She is afebrile not tachycardic she is tachypneic,end expiratory wheezing.  Family noted that  patient has been more confused for  the past several days since having radiation to brain mets.  Received Solu-Medrol , breathing treatments.  Did have tachypnea/increased work of breathing unable to tolerate BiPAP.  Placed on high flow nasal cannula, but after second breathing treatment and anxiolytics improved.  CTA findings as noted in ED course.  Chronic PE, compression and collapse of right middle and lower lobe.  Pulmonology consulted and will evaluate patient.  Other labs with mildly low sodium, normal kidney function.  No leukocytosis to suggest systemic infection.  Stable anemia.  Given patient's overall clinical picture and numerous comorbidities she is at high risk for  further decompensation especially in setting of her increased oxygen requirements.  Will admit to hospitalist for further treatment of multifactorial acute hypoxic respiratory failure as well as her encephalopathy.   Amount and/or Complexity of Data Reviewed Labs: ordered. Radiology: ordered and independent interpretation performed. Decision-making details documented in ED Course.    Details: On CT head I do not appreciate obvious hemorrhage.  Radiology agrees, chronic findings with known brain mets.  Risk Prescription drug management. Decision regarding hospitalization. Diagnosis or treatment significantly limited by social determinants of health. Risk Details: Poor health literacy.         Final diagnoses:  Acute hypoxic respiratory failure [facility])    ED Discharge Orders     None          Neysa Caron PARAS, DO 06/14/24 1248    Neysa Caron PARAS, DO Jul 06, 2024 [phone]

## 2024-06-14 NOTE — Plan of Care (Signed)
  Problem: Education: Goal: Knowledge of General Education information will improve Description: Including pain rating scale, medication(s)/side effects and non-pharmacologic comfort measures 06/14/2024 1809 by Kermit Erby NOVAK, RN Outcome: Progressing 06/14/2024 1809 by Kermit Erby NOVAK, RN Outcome: Progressing

## 2024-06-14 NOTE — ED Notes (Signed)
 CCMD called, pt on monitor

## 2024-06-14 NOTE — Telephone Encounter (Signed)
 Left the patient a voicemail with the rescheduled 7/09 to 7/15 appointment details and also informed the patient that the 7/08 appointments are still scheduled.

## 2024-06-14 NOTE — ED Notes (Signed)
 Pt family asked this RN if pt could use the restroom. Advised not getting up att, placed a purewick. Pt requested to walk to the bathroom. This RN told pt's family that due to her respiratory status she could not get her out of bed. Pt's family approached this RN and reported that they unplugged monitoring and O2 and walked pt to bathroom. Pt and family advised to not do this again; reported they were tired of waiting.

## 2024-06-14 NOTE — Consult Note (Signed)
 Consultation Note Date: 06/14/2024   Patient Name: Kristina Howe  DOB: 1961/03/21  MRN: [id number]  Age / Sex: 63 y.o., female  PCP: [name] BROCKS, FNP Referring Physician: Seena Marsa NOVAK, MD  Reason for Consultation: {Reason for Consult:23484}  HPI/Patient Profile: 63 y.o. female  with past medical history of *** admitted on 06/14/2024 with ***.   Clinical Assessment and Goals of Care: ***  Primary Decision Maker {Primary Decision Fjxzm:78612}    SUMMARY OF RECOMMENDATIONS   ***  Code Status/Advance Care Planning: {Palliative Code status:23503}   Symptom Management:  ***  Palliative Prophylaxis:  {Palliative Prophylaxis:21015}  Additional Recommendations (Limitations, Scope, Preferences): {Recommended Scope and Preferences:21019}  Psycho-social/Spiritual:  Desire for further Chaplaincy support:{YES NO:22349} Additional Recommendations: {PAL SOCIAL:21064}  Prognosis:  {Palliative Care Prognosis:23504}  Discharge Planning: {Palliative dispostion:23505}      Primary Diagnoses: Present on Admission:  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)  Pulmonary embolism (HCC)  Anxiety  Acute on chronic respiratory failure with hypoxia (HCC)   I have reviewed the medical record, interviewed the patient and family, and examined the patient. The following aspects are pertinent.  Past Medical History:  Diagnosis Date   Anemia 04/02/2024   hgb = 9   Anxiety    tx w/ xanax  and celexa    Cancer (HCC)    Elevated cholesterol    Hypertension    Subclavian artery stenosis, right (HCC) 04/19/2024   right forearm pain;  thrombophlebitis of her superficial basilic forearm veins- instructed her to take ASA 81 mg & warm compresses   Social History   Socioeconomic History   Marital status: Significant Other    Spouse name: Not on file   Number of children: Not on  file   Years of education: Not on file   Highest education level: Not on file  Occupational History   Not on file  Tobacco Use   Smoking status: Former    Types: Cigarettes    Start date: 03/18/2024    Quit date: 03/18/1994    Years since quitting: 30.2    Passive exposure: Never   Smokeless tobacco: Never   Tobacco comments:    Quit smoking 03/18/2024   Vaping Use   Vaping status: Never Used  Substance and Sexual Activity   Alcohol use: Not Currently   Drug use: No   Sexual activity: Not Currently    Birth control/protection: Surgical    Comment: Hysterectomy  Other Topics Concern   Not on file  Social History Narrative   Not on file   Social Drivers of Health   Financial Resource Strain: Not on file  Food Insecurity: No Food Insecurity (06/01/2024)   Hunger Vital Sign    Worried About Running Out of Food in the Last Year: Never true    Ran Out of Food in the Last Year: Never true  Transportation Needs: No Transportation Needs (06/01/2024)   PRAPARE - Transportation    Lack of Transportation (Medical): No    Lack of  Transportation (Non-Medical): No  Physical Activity: Not on file  Stress: Not on file  Social Connections: Not on file   History reviewed. No pertinent family history. Scheduled Meds:  [START ON 06/15/2024] atorvastatin   40 mg Oral Daily   enoxaparin   50 mg Subcutaneous Q12H   [START ON 06/15/2024] FLUoxetine   20 mg Oral Daily   guaiFENesin   600 mg Oral BID   [START ON 06/15/2024] irbesartan   300 mg Oral Daily   sodium chloride  flush  3 mL Intravenous Q12H   sodium chloride  HYPERTONIC  4 mL Nebulization Daily   Continuous Infusions: PRN Meds:.acetaminophen  **OR** acetaminophen , ALPRAZolam , ipratropium-albuterol , polyethylene glycol No Known Allergies Review of Systems  Physical Exam  Vital Signs: BP (!) 140/90   Pulse (!) 107   Temp 97.8 F (36.6 C)   Resp 20   Ht 5' 7 (1.702 m)   Wt 42.6 kg   SpO2 100%   BMI 14.72 kg/m          SpO2:  SpO2: 100 % O2 Device:SpO2: 100 % O2 Flow Rate: .O2 Flow Rate (L/min): 4 L/min  IO: Intake/output summary: No intake or output data in the 24 hours ending 06/14/24 1517  LBM:   Baseline Weight: Weight: 42.6 kg Most recent weight: Weight: 42.6 kg     Palliative Assessment/Data:     Time In: *** Time Out: *** Time Total: *** Greater than 50%  of this time was spent counseling and coordinating care related to the above assessment and plan.  Signed by: Bernarda Kitty, NP Palliative Medicine Team Pager # [phone] (M-F 8a-5p) Team Phone # [phone] (Nights/Weekends)

## 2024-06-14 NOTE — Telephone Encounter (Signed)
 Copied from CRM #[id number]. Topic: Clinical Concerns - Medical Question >> Jun 14, 2024  9:38 AM Robertha HERO wrote: susan - sister is calling other request    Include all details related to the request(s) below: Patient sister states she was returning a call.  The patient is in the hospital will have to reschedule her appointment.  If any questions call back home number or sister Devere Console and type the Best Contact Number below:  Patient/caller contact number:        [phone]      [] Home  [x] Mobile  [] Work [] Other   [] Okay to leave a voicemail   Medication List:  Current Outpatient Medications:  .  albuterol  HFA (PROVENTIL  HFA;VENTOLIN  HFA;PROAIR  HFA) 90 mcg/actuation inhaler, Inhale 2 puffs every 6 (six) hours as needed for wheezing or shortness of breath., Disp: , Rfl:  .  ALPRAZolam  (XANAX ) 0.5 mg tablet, Take 1 tablet (0.5 mg total) by mouth 3 (three) times a day as needed for anxiety., Disp: 90 tablet, Rfl: 0 .  apixaban  (ELIQUIS ) 5 mg (74 tabs) DsPk dose pack, Take 5 mg by mouth., Disp: , Rfl:  .  atorvastatin  (LIPITOR) 40 mg tablet, , Disp: , Rfl: 0 .  enoxaparin  (LOVENOX ) 60 mg/0.6 mL syrg, Inject 50 mg under the skin., Disp: , Rfl:  .  FLUoxetine  (PROzac ) 20 mg capsule, Take one capsule (20 mg total) by mouth daily., Disp: 90 capsule, Rfl: 3 .  losartan (COZAAR) 100 mg tablet, Take 100 mg by mouth Once Daily., Disp: , Rfl:  .  miscellaneous medical supply misc, PORTABLE OXYGEN TANK for Dx: HYPOXIA, Disp: 1 each, Rfl: 0 .  naloxone (NARCAN) 4 mg/actuation spry nasal spray, Spray the contents of 1 device into 1 nostril. Call 911. May repeat with 2nd device in alternate nostril if no response in 2-3 minutes., Disp: 2 each, Rfl: 0 .  NIFEdipine  (ADALAT  CC) 30 mg ER tablet, Take 30 mg by mouth daily., Disp: , Rfl:  .  oxyCODONE  (ROXICODONE ) 5 mg immediate release tablet, , Disp: , Rfl:  .  triamterene -hydroCHLOROthiazide  (DYAZIDE ) 37.5-25 mg per capsule, Take 1 capsule by  mouth daily., Disp: 90 capsule, Rfl: 1     Medication Request/Refills: Pharmacy Information (if applicable)   [] Not Applicable       []  Pharmacy listed  Send Medication Request to:                                                 [] Pharmacy not listed (added to pharmacy list in Epic) Send Medication Request to:      Listed Pharmacies: [id number] [place], [place] - [address] - PHONE: [phone] - FAX: [phone] [id number] [place], [place] - [address] AT [facility] [address] - PHONE: [phone] - FAX: [phone] [id number] [place], [place] - [address] AT [facility] [address] & [facility] [address] - PHONE: [phone] - FAX: [phone] [id number] - [place], [place] - [address] - PHONE: [phone] - FAX: [phone]

## 2024-06-14 NOTE — H&P (Addendum)
 History and Physical   Kristina Howe FMW:[id number] DOB: [birth year] DOA: 06/14/2024  PCP: [name] BROCKS, FNP   Chief Complaint: Shortness of breath  HPI: Kristina Howe is a 63 y.o. 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  Patient admitted 6/9-6/10 with PE started on anticoagulation.  Readmitted 6/24-6/27 with acute respiratory failure likely secondary to pneumonia in the setting of recent PE and lung cancer.  Improved with antibiotics and discharged on 2 L supplemental oxygen.  Has had some gradual shortness of breath since that time but it abruptly increased for the past day.  Noted to have increased home oxygen from 2 to 4 L maintain saturations.  Also reportedly had increased cough and sputum production.  Also reportedly had some increased confusion after starting the radiation therapy for her brain metastases.  Patient denies fevers, chill, abdominal pain, nausea.  ED Course: Vital signs in the ED notable for heart rate in the 70s-120s, blood pressure in the 110s-130s systolic, respirate in the 20s-30s.  Initially on 4 L of oxygen and then transition to heated high flow.  Lab workup included CMP with sodium 134, bicarb 20, albumin  2.9.  CBC with hemoglobin 10.4.  Troponin negative x 2.  Urinalysis, UDS, ammonia level pending.  Ethanol level negative.  VBG with pH 7.467 and PCO2 30.9.  Imaging included CT head which showed area that corresponds to previous mets on MRI but no acute dramality.  Chest x-ray showed stable changes from 6/24 with right middle lobe and right lower lobe atelectasis with bulky adenopathy.  CTA PE study showed small nonocclusive right upper lobe PE similar to previous but no new PE.  Also noted was complete occlusion of bronchus  intermedius and the right middle lobe and right lower lobe bronchi with collapse of the right middle lobe and right lower lobe.  Patient received droperidol , Ativan , Solu-Medrol , albuterol  x 2, Atrovent .  Pulmonology/critical care was consulted for possible bronchoscopy but on their evaluation CT shows that bronchus occlusion is not secondary to mucous plugging but is instead due to mass effect from lung cancer.  Recommending some hypertonic saline and other interventions but will need to get in touch with patient's cancer team and palliative team for plan.  Review of Systems: As per HPI otherwise all other systems reviewed and are negative.  Past Medical History:  Diagnosis Date   Anemia 04/02/2024   hgb = 9   Anxiety    tx w/ xanax  and celexa    Cancer (HCC)    Elevated cholesterol    Hypertension    Subclavian artery stenosis, right (HCC) 04/19/2024   right forearm pain;  thrombophlebitis of her superficial basilic forearm veins- instructed her to take ASA 81 mg & warm compresses    Past Surgical History:  Procedure Laterality Date   ABDOMINAL HYSTERECTOMY     AORTOGRAM N/A 03/11/2024   Procedure: AORTIC ARCH ANGIOGRAM;  Surgeon: Pearline Norman RAMAN, MD;  Location: [facility] OR;  Service: Vascular;  Laterality: N/A;   BRONCHIAL BIOPSY  04/26/2024   Procedure: BRONCHOSCOPY, WITH BIOPSY;  Surgeon: Shelah Lamar RAMAN, MD;  Location: [facility] ENDOSCOPY;  Service: Pulmonary;;   BRONCHIAL BRUSHINGS  04/26/2024   Procedure: BRONCHOSCOPY, WITH BRUSH BIOPSY;  Surgeon: Shelah Lamar RAMAN, MD;  Location: [facility] ENDOSCOPY;  Service: Pulmonary;;   BRONCHIAL NEEDLE ASPIRATION BIOPSY  04/26/2024  Procedure: BRONCHOSCOPY, WITH NEEDLE ASPIRATION BIOPSY;  Surgeon: Shelah Lamar RAMAN, MD;  Location: [facility] ENDOSCOPY;  Service: Pulmonary;;   BRONCHIAL WASHINGS  04/26/2024   Procedure: IRRIGATION, BRONCHUS;  Surgeon: Shelah Lamar RAMAN, MD;  Location: [facility] ENDOSCOPY;  Service: Pulmonary;;   BRONCHOSCOPY, WITH BIOPSY USING ELECTROMAGNETIC  NAVIGATION Right 04/26/2024   Procedure: BRONCHOSCOPY, WITH BIOPSY USING ELECTROMAGNETIC NAVIGATION;  Surgeon: Shelah Lamar RAMAN, MD;  Location: [facility] ENDOSCOPY;  Service: Pulmonary;  Laterality: Right;   COLONOSCOPY  2024   INSERTION OF RETROGRADE CAROTID STENT Right 03/11/2024   Procedure: INSERTION RIGHT SUBCLAVIAN ARTERY AND RIGHT COMMON CAROTID ARTERY STENTS;  Surgeon: Pearline Norman RAMAN, MD;  Location: [facility] OR;  Service: Vascular;  Laterality: Right;   THORACIC EXPOSURE Right 03/11/2024   Procedure: DIRECT EXPOSURE OF RIGHT COMMON CAROTID ARTERY;  Surgeon: Pearline Norman RAMAN, MD;  Location: [facility] OR;  Service: Vascular;  Laterality: Right;   ULTRASOUND GUIDANCE FOR VASCULAR ACCESS Right 03/11/2024   Procedure: ULTRASOUND GUIDANCE, FOR VASCULAR ACCESS, RIGHT RADIAL ARTERY AND RIGHT FEMORAL ARTERY;  Surgeon: Pearline Norman RAMAN, MD;  Location: [facility] OR;  Service: Vascular;  Laterality: Right;   UPPER EXTREMITY ANGIOGRAM Right 03/11/2024   Procedure: GERALYN, RIGHT UPPER EXTREMITY;  Surgeon: Pearline Norman RAMAN, MD;  Location: [facility] OR;  Service: Vascular;  Laterality: Right;   UPPER GI ENDOSCOPY  2022   VIDEO BRONCHOSCOPY WITH ENDOBRONCHIAL ULTRASOUND Right 04/26/2024   Procedure: BRONCHOSCOPY, WITH EBUS;  Surgeon: Shelah Lamar RAMAN, MD;  Location: [facility] ENDOSCOPY;  Service: Pulmonary;  Laterality: Right;  Possible robotic navigation as well depending on CT results    Social History  reports that she quit smoking about 30 years ago. Her smoking use included cigarettes. She started smoking about 2 months ago. She has never been exposed to tobacco smoke. She has never used smokeless tobacco. She reports that she does not currently use alcohol. She reports that she does not use drugs.  No Known Allergies  History reviewed. No pertinent family history.  Prior to Admission medications   Medication Sig Start Date End Date Taking? Authorizing Provider  albuterol  (VENTOLIN  HFA) 108 (90 Base) MCG/ACT inhaler Inhale 2 puffs  into the lungs every 6 (six) hours as needed for wheezing or shortness of breath. Patient taking differently: Inhale 2 puffs into the lungs every 4 (four) hours as needed for wheezing or shortness of breath. 05/18/24   Raenelle Coria, MD  ALPRAZolam  (XANAX ) 1 MG tablet Take 1 tablet (1 mg total) by mouth 3 (three) times daily as needed for anxiety. Patient taking differently: Take 1 mg by mouth 3 (three) times daily. 05/24/24   Haze Lonni PARAS, MD  atorvastatin  (LIPITOR) 40 MG tablet Take 1 tablet (40 mg total) by mouth daily. 03/12/24   Lue Elsie BROCKS, MD  enoxaparin  (LOVENOX ) 60 MG/0.6ML injection Inject 0.5 mLs (50 mg total) into the skin every 12 (twelve) hours. Patient not taking: Reported on 06/01/2024 05/18/24   Raenelle Coria, MD  ferrous sulfate  325 (65 FE) MG tablet Take 325 mg by mouth daily with breakfast.    [provider]  FLUoxetine  (PROZAC ) 20 MG capsule Take 20 mg by mouth daily.    [provider]  magnesium  30 MG tablet Take 60 mg by mouth at bedtime.    [provider]  Multiple Vitamins-Minerals (MULTIVITAMIN WITH MINERALS) tablet Take 1 tablet by mouth daily with breakfast.    [provider]  oxyCODONE  (OXY IR/ROXICODONE ) 5 MG immediate release tablet Take 1 tablet (5 mg total)  by mouth every 6 (six) hours as needed for severe pain (pain score 7-10). Patient not taking: Reported on 06/01/2024 05/18/24   Raenelle Coria, MD  triamterene -hydrochlorothiazide  (MAXZIDE -25) 37.5-25 MG per tablet Take 1 tablet by mouth daily.      [provider]  TYLENOL  325 MG tablet Take 325-650 mg by mouth every 6 (six) hours as needed for mild pain (pain score 1-3) or headache.    [provider]  valsartan (DIOVAN) 320 MG tablet Take 320 mg by mouth daily. 12/31/23   [provider]    Physical Exam: Vitals:   06/14/24 1045 06/14/24 1100 06/14/24 1159 06/14/24 1230  BP: 135/81 (!) 118/92    Pulse: (!) 114 (!) 110  (!) 123   Resp: (!) 32 (!) 26  20  Temp:   97.8 F (36.6 C)   TempSrc:      SpO2: 98% 96%  98%  Weight:      Height:        Physical Exam Constitutional:      General: She is not in acute distress.    Appearance: Normal appearance. She is ill-appearing.     Comments: Frail, anxious appearing female  HENT:     Head: Normocephalic and atraumatic.     Mouth/Throat:     Mouth: Mucous membranes are moist.     Pharynx: Oropharynx is clear.  Eyes:     Extraocular Movements: Extraocular movements intact.     Pupils: Pupils are equal, round, and reactive to light.  Cardiovascular:     Rate and Rhythm: Regular rhythm. Tachycardia present.     Pulses: Normal pulses.     Heart sounds: Normal heart sounds.  Pulmonary:     Effort: Pulmonary effort is normal. No respiratory distress.     Breath sounds: Examination of the right-middle field reveals decreased breath sounds. Examination of the right-lower field reveals decreased breath sounds. Decreased breath sounds present.  Abdominal:     General: Bowel sounds are normal. There is no distension.     Palpations: Abdomen is soft.     Tenderness: There is no abdominal tenderness.  Musculoskeletal:        General: No swelling or deformity.  Skin:    General: Skin is warm and dry.  Neurological:     General: No focal deficit present.     Mental Status: Mental status is at baseline.    Labs on Admission: I have personally reviewed following labs and imaging studies  CBC: Recent Labs  Lab 06/14/24 0830 06/14/24 0956 06/14/24 1028  WBC 4.5  --   --   NEUTROABS 3.5  --   --   HGB 10.4* 11.6* 11.2*  HCT 33.7* 34.0* 33.0*  MCV 83.2  --   --   PLT 372  --   --     Basic Metabolic Panel: Recent Labs  Lab 06/14/24 0830 06/14/24 0956 06/14/24 1028  NA 134* 133* 134*  K 3.9 3.7 3.6  CL 99 101  --   CO2 20*  --   --   GLUCOSE 96 119*  --   BUN 9 10  --   CREATININE 0.74 0.70  --   CALCIUM  10.0  --   --     GFR: Estimated Creatinine  Clearance: 49.7 mL/min (by C-G formula based on SCr of 0.7 mg/dL).  Liver Function Tests: Recent Labs  Lab 06/14/24 0830  AST 19  ALT 11  ALKPHOS 65  BILITOT 0.6  PROT  7.4  ALBUMIN  2.9*    Urine analysis:    Component Value Date/Time   COLORURINE YELLOW 04/12/2022 1117   APPEARANCEUR CLEAR 04/12/2022 1117   LABSPEC 1.011 04/12/2022 1117   PHURINE 7.0 04/12/2022 1117   GLUCOSEU NEGATIVE 04/12/2022 1117   HGBUR NEGATIVE 04/12/2022 1117   BILIRUBINUR NEGATIVE 04/12/2022 1117   KETONESUR NEGATIVE 04/12/2022 1117   PROTEINUR NEGATIVE 04/12/2022 1117   NITRITE NEGATIVE 04/12/2022 1117   LEUKOCYTESUR NEGATIVE 04/12/2022 1117    Radiological Exams on Admission: CT Angio Chest PE W and/or Wo Contrast Result Date: 06/14/2024 CLINICAL DATA:  Concern for pulmonary embolism.  Lung cancer. EXAM: CT ANGIOGRAPHY CHEST WITH CONTRAST TECHNIQUE: Multidetector CT imaging of the chest was performed using the standard protocol during bolus administration of intravenous contrast. Multiplanar CT image reconstructions and MIPs were obtained to evaluate the vascular anatomy. RADIATION DOSE REDUCTION: This exam was performed according to the departmental dose-optimization program which includes automated exposure control, adjustment of the mA and/or kV according to patient size and/or use of iterative reconstruction technique. CONTRAST:  75mL OMNIPAQUE  IOHEXOL  350 MG/ML SOLN COMPARISON:  CT dated 05/24/2024. FINDINGS: Cardiovascular: There is no cardiomegaly. Interval increase in the size of the pericardial effusion measuring approximately 1 cm in thickness anterior to the heart. There is mild atherosclerotic calcification of the thoracic aorta. No aneurysmal dilatation or dissection. Stents in the proximal right common carotid and subclavian arteries noted. The origins of the great vessels of the aortic arch are patent. Small nonocclusive right upper lobe segmental pulmonary artery embolus similar to prior CT.  No new pulmonary embolus. No evidence of right heart straining. Mediastinum/Nodes: Bulky hilar and mediastinal adenopathy progressed since the prior CT. Bilateral supraclavicular adenopathy noted. There is mass effect and compression of the midportion of the esophagus by the mediastinal adenopathy. No mediastinal fluid collection. Lungs/Pleura: There is complete occlusion of the bronchus intermedius and right middle and right lower lobe bronchi with complete collapse of the right middle and right lower lobes. This is likely related to tumor invasion. There is a small right pleural effusion. There is background of emphysema. Linear scarring in the left apex. No pneumothorax. Upper Abdomen: No acute abnormality. Musculoskeletal: Osteopenia.  No acute osseous pathology. Review of the MIP images confirms the above findings. IMPRESSION: 1. Small nonocclusive right upper lobe segmental pulmonary artery embolus similar to prior CT. No new pulmonary embolus. No evidence of right heart straining. 2. Complete occlusion of the bronchus intermedius and right middle and right lower lobe bronchi with complete collapse of the right middle and right lower lobes. 3. Small right pleural effusion. 4. Bulky hilar and mediastinal adenopathy progressed since the prior CT. 5. Aortic Atherosclerosis (ICD10-I70.0) and Emphysema (ICD10-J43.9). Electronically Signed   By: Vanetta Chou M.D.   On: 06/14/2024 11:55   CT Head Wo Contrast Result Date: 06/14/2024 CLINICAL DATA:  Mental status change, unknown cause EXAM: CT HEAD WITHOUT CONTRAST TECHNIQUE: Contiguous axial images were obtained from the base of the skull through the vertex without intravenous contrast. RADIATION DOSE REDUCTION: This exam was performed according to the departmental dose-optimization program which includes automated exposure control, adjustment of the mA and/or kV according to patient size and/or use of iterative reconstruction technique. COMPARISON:  MRI of the  head dated May 26, 2024. FINDINGS: Brain: Age-related cerebral volume loss. Area of diminished attenuation within the left posterior temporal lobe corresponding with the ring-enhancing lesion noted on the previous MRI. There is also moderate subcortical and deep cerebral white matter  disease. No discrete lesions are evident within the cerebellar hemispheres. Vascular: Negative. Skull: Unremarkable. Sinuses/Orbits: Normal. Other: None. IMPRESSION: 1. Focal area of diminished attenuation at the left temporal occipital junction corresponding with the likely metastatic lesion seen on previous MRI. The other lesions are not evident, but the current study is limited by the absence of intravenous contrast. 2. Moderate cerebral white matter disease. Electronically Signed   By: Evalene Coho M.D.   On: 06/14/2024 11:54   DG Chest Portable 1 View Result Date: 06/14/2024 CLINICAL DATA:  Shortness of breath, known stage IV lung cancer and COPD EXAM: PORTABLE CHEST 1 VIEW COMPARISON:  Prior chest x-ray 06/01/2024 FINDINGS: Unchanged contour of the mediastinum consistent with bulky mediastinal lymphadenopathy. Chronic bronchitic changes are similar compared to prior. Probable right middle and right lower lobe atelectasis is similar compared to prior. No new effusion or pneumothorax. No acute osseous abnormality. IMPRESSION: Stable appearance of the chest without significant interval change compared to 06/01/2024. Right middle and right lower lobe atelectasis with bulky mediastinal adenopathy. Electronically Signed   By: Wilkie Lent M.D.   On: 06/14/2024 08:15   EKG: Independently reviewed.  Sinus rhythm at 85 bpm.  Nonspecific T wave changes.  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 > After last admission was discharged on 2 L and now is requiring 4 L supplemental oxygen.  Eventually transition to high flow nasal cannula in the ED. > Imaging showed bulky adenopathy with complete occlusion of bronchus intermedius, right middle lobe and right lower lobe bronchus.  With collapse of the right middle lobe and right lower lobe. > Imaging showed stable PE with no new PE. > Pulmonology consulted and given cause is not due to mucous plugging that is due to mass effect have reached out to oncology team and awaiting hear back.  Otherwise recommending saline nebulization and Mucinex  for further optimization. > Will need to coordinate with patient's oncology team and have ongoing assessments with palliative care to develop plan going forward. - Monitor in progressive unit overnight - Appreciate pulmonology recommendations and assistance - Continue to reach out to oncology for their input - Consult to palliative medicine - Continue supplemental oxygen, wean as tolerated - As needed DuoNebs - Saline nebs, Mucinex  - Continue with Lovenox  for pulmonary embolism - Supportive care ADDENDUM > Oxygen requirement returning to baseline in ED with initial treatment and improvement in patient's anxiety. ADDENDUM 2 > After discussion with patient's oncology team.  Radiation oncology will be seeing the patient while admitted.  They have requested patient to be transferred to [facility] if able as she will likely need multiple treatments while admitted. - Order placed for patient transfer to [facility] when able.  Patient has been scheduled for treatment tomorrow morning at 10 AM and, per radiation  oncology, can be transferred via [facility] in the morning if unable to get a bed before then.  Encephalopathy > Has been having some confusion after starting radiotherapy for brain metastases.  Likely related to this treatment. >  No acute abnormality on CT head. - Negative alcohol level in the ED. - Follow-up urinalysis, UDS, ammonia - Supportive care  Hyperlipidemia - Continue statin  Anemia - Hemoglobin stable 10.4 - Trend CBC  Anxiety - Continue home Xanax  and fluoxetine   History of subclavian stenosis - Status post stenting  History of Buerger's disease - Noted  DVT prophylaxis: Lovenox  (treatment dose) Code Status:   DNR, okay with intubation if temporary. Family Communication:  Updated at bedside  Disposition Plan:   Patient is from:  Home  Anticipated DC to:  Pending clinical course  Anticipated DC date:  1 to 7 days  Anticipated DC barriers: None  Consults called:  Pulmonary medicine, oncology, palliative medicine Admission status:  Observation, progressive  Severity of Illness: The appropriate patient status for this patient is OBSERVATION. Observation status is judged to be reasonable and necessary in order to provide the required intensity of service to ensure the patient's safety. The patient's presenting symptoms, physical exam findings, and initial radiographic and laboratory data in the context of their medical condition is felt to place them at decreased risk for further clinical deterioration. Furthermore, it is anticipated that the patient will be medically stable for discharge from the hospital within 2 midnights of admission.    Marsa KATHEE Scurry MD [facility]  How to contact the [facility] Attending or Consulting provider 7A - 7P or covering provider during after hours 7P -7A, for this patient?   Check the care team in [facility] and look for a) attending/consulting [facility] provider listed and b) the [facility] team listed Log into [email or web address] and use [facility]  universal password to access. If you do not have the password, please contact the hospital operator. Locate the [facility] provider you are looking for under [facility] and page to a number that you can be directly reached. If you still have difficulty reaching the provider, please page the [facility] (Director on Call) for the Hospitalists listed on amion for assistance.  06/14/2024, 1:42 PM

## 2024-06-14 NOTE — ED Notes (Signed)
 Pt to floor with NT.

## 2024-06-14 NOTE — ED Triage Notes (Signed)
 Pt BIB [facility] from home for [facility]. Pt has stage 4 lung cancer and COPD, as well as anxiety hx. Per EMS lung sounds clear, on 4L [place] at 94%. All other VSS.

## 2024-06-14 NOTE — Consult Note (Signed)
 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  Went for CTA chest which has a stable appearing PE and new complete occlusion of bronchus intermedius with RML RLL collapse. CT is also significant for progression of bulky hilar and mediastinal adenopathy compared to June imaging.   She is on RA in the ED. Family notes worse confusion this morning vs previously. Ongoing unintentional wt loss.   Had started radiation for her brain tumor, has not yet started tx for lungs.  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 rate (!) 32, height 5' 7 (1.702 m), weight 42.6 kg, SpO2 98%.    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 Interval  progression in cancer burden vs imaging last month Chronic hypoxic resp failure COPD PE   DNR status  Plan -Will add hypertonic nebs and mucinex  to help thin secretions, but I dont think that her bronchus intermedius occlusion is related to mucus plug. Not much that pulm can add.  -I have reached out to med onc to see if this interval progression changes tx plan  -think quite reasonable for Obvs with TRH in the immediate sense  -ok to eat/drink -- we are not planning procedures -lovenox  for PE  -O2 as needed and at night  -DNR/I  Best Practice (right click and Reselect all SmartList Selections daily)   Diet/type: Regular consistency (see orders) DVT prophylaxis systemic dose LMWH Pressure ulcer(s): pressure ulcer assessment deferred  GI prophylaxis: N/A Lines: N/A Foley:  N/A Code Status:  DNR Last date of multidisciplinary goals of care discussion [--]  Labs   CBC: Recent Labs  Lab 06/14/24 0830 06/14/24 0956 06/14/24 1028  WBC 4.5  --   --   NEUTROABS 3.5  --   --   HGB 10.4* 11.6* 11.2*  HCT 33.7* 34.0* 33.0*  MCV 83.2  --   --   PLT 372  --   --     Basic Metabolic Panel: Recent Labs  Lab 06/14/24 0830 06/14/24 0956 06/14/24 1028  NA 134* 133* 134*  K 3.9 3.7  3.6  CL 99 101  --   CO2 20*  --   --   GLUCOSE 96 119*  --   BUN 9 10  --   CREATININE 0.74 0.70  --   CALCIUM  10.0  --   --    GFR: Estimated Creatinine Clearance: 49.7 mL/min (by C-G formula based on SCr of 0.7 mg/dL). Recent Labs  Lab 06/14/24 0830  WBC 4.5    Liver Function Tests: Recent Labs  Lab 06/14/24 0830  AST 19  ALT 11  ALKPHOS 65  BILITOT 0.6  PROT 7.4  ALBUMIN  2.9*   No results for input(s): LIPASE, AMYLASE in the last 168 hours. No results for input(s): AMMONIA in the last 168 hours.  ABG    Component Value Date/Time   PHART 7.341 (L) 03/11/2024 1329   PCO2ART 47.1 03/11/2024 1329   PO2ART 195 (H)  03/11/2024 1329   HCO3 22.1 06/14/2024 1028   TCO2 23 06/14/2024 1028   ACIDBASEDEF 1.0 06/14/2024 1028   O2SAT 99 06/14/2024 1028     Coagulation Profile: No results for input(s): INR, PROTIME in the last 168 hours.  Cardiac Enzymes: No results for input(s): CKTOTAL, CKMB, CKMBINDEX, TROPONINI in the last 168 hours.  HbA1C: Hgb A1c MFr Bld  Date/Time Value Ref Range Status  03/08/2024 07:54 AM 6.1 (H) 4.8 - 5.6 % Final    Comment:    (NOTE) Pre diabetes:          5.7%-6.4%  Diabetes:              >6.4%  Glycemic control for   <7.0% adults with diabetes     CBG: No results for input(s): GLUCAP in the last 168 hours.  Review of Systems:   Review of Systems  Constitutional:  Positive for chills.  Respiratory:  Positive for cough, sputum production and shortness of breath. Negative for hemoptysis and wheezing.   Gastrointestinal:        Unintentional weight loss, loss of appetite  Neurological:        Confusion  Psychiatric/Behavioral:  The patient is nervous/anxious.      Past Medical History:  She,  has a past medical history of Anemia (04/02/2024), Anxiety, Cancer (HCC), Elevated cholesterol, Hypertension, and Subclavian artery stenosis, right (HCC) (04/19/2024).   Surgical History:   Past Surgical History:  Procedure Laterality Date   ABDOMINAL HYSTERECTOMY     AORTOGRAM N/A 03/11/2024   Procedure: AORTIC ARCH ANGIOGRAM;  Surgeon: Pearline Norman RAMAN, MD;  Location: [facility] OR;  Service: Vascular;  Laterality: N/A;   BRONCHIAL BIOPSY  04/26/2024   Procedure: BRONCHOSCOPY, WITH BIOPSY;  Surgeon: Shelah Lamar RAMAN, MD;  Location: [facility] ENDOSCOPY;  Service: Pulmonary;;   BRONCHIAL BRUSHINGS  04/26/2024   Procedure: BRONCHOSCOPY, WITH BRUSH BIOPSY;  Surgeon: Shelah Lamar RAMAN, MD;  Location: [facility] ENDOSCOPY;  Service: Pulmonary;;   BRONCHIAL NEEDLE ASPIRATION BIOPSY  04/26/2024   Procedure: BRONCHOSCOPY, WITH NEEDLE ASPIRATION BIOPSY;  Surgeon: Shelah Lamar RAMAN, MD;   Location: [facility] ENDOSCOPY;  Service: Pulmonary;;   BRONCHIAL WASHINGS  04/26/2024   Procedure: IRRIGATION, BRONCHUS;  Surgeon: Shelah Lamar RAMAN, MD;  Location: [facility] ENDOSCOPY;  Service: Pulmonary;;   BRONCHOSCOPY, WITH BIOPSY USING ELECTROMAGNETIC NAVIGATION Right 04/26/2024   Procedure: BRONCHOSCOPY, WITH BIOPSY USING ELECTROMAGNETIC NAVIGATION;  Surgeon: Shelah Lamar RAMAN, MD;  Location: [facility] ENDOSCOPY;  Service: Pulmonary;  Laterality: Right;   COLONOSCOPY  2024   INSERTION OF RETROGRADE CAROTID STENT Right 03/11/2024   Procedure: INSERTION RIGHT SUBCLAVIAN ARTERY  AND RIGHT COMMON CAROTID ARTERY STENTS;  Surgeon: Pearline Norman RAMAN, MD;  Location: [facility] OR;  Service: Vascular;  Laterality: Right;   THORACIC EXPOSURE Right 03/11/2024   Procedure: DIRECT EXPOSURE OF RIGHT COMMON CAROTID ARTERY;  Surgeon: Pearline Norman RAMAN, MD;  Location: [facility] OR;  Service: Vascular;  Laterality: Right;   ULTRASOUND GUIDANCE FOR VASCULAR ACCESS Right 03/11/2024   Procedure: ULTRASOUND GUIDANCE, FOR VASCULAR ACCESS, RIGHT RADIAL ARTERY AND RIGHT FEMORAL ARTERY;  Surgeon: Pearline Norman RAMAN, MD;  Location: [facility] OR;  Service: Vascular;  Laterality: Right;   UPPER EXTREMITY ANGIOGRAM Right 03/11/2024   Procedure: GERALYN, RIGHT UPPER EXTREMITY;  Surgeon: Pearline Norman RAMAN, MD;  Location: [facility] OR;  Service: Vascular;  Laterality: Right;   UPPER GI ENDOSCOPY  2022   VIDEO BRONCHOSCOPY WITH ENDOBRONCHIAL ULTRASOUND Right 04/26/2024   Procedure: BRONCHOSCOPY, WITH EBUS;  Surgeon: Shelah Lamar RAMAN, MD;  Location: [facility] ENDOSCOPY;  Service: Pulmonary;  Laterality: Right;  Possible robotic navigation as well depending on CT results     Social History:   reports that she quit smoking about 30 years ago. Her smoking use included cigarettes. She started smoking about 2 months ago. She has never been exposed to tobacco smoke. She has never used smokeless tobacco. She reports that she does not currently use alcohol. She reports that she does not use  drugs.   Family History:  Her family history is not on file.   Allergies No Known Allergies   Home Medications  Prior to Admission medications   Medication Sig Start Date End Date Taking? Authorizing Provider  albuterol  (VENTOLIN  HFA) 108 (90 Base) MCG/ACT inhaler Inhale 2 puffs into the lungs every 6 (six) hours as needed for wheezing or shortness of breath. Patient taking differently: Inhale 2 puffs into the lungs every 4 (four) hours as needed for wheezing or shortness of breath. 05/18/24   Raenelle Coria, MD  ALPRAZolam  (XANAX ) 1 MG tablet Take 1 tablet (1 mg total) by mouth 3 (three) times daily as needed for anxiety. Patient taking differently: Take 1 mg by mouth 3 (three) times daily. 05/24/24   Haze Lonni PARAS, MD  atorvastatin  (LIPITOR) 40 MG tablet Take 1 tablet (40 mg total) by mouth daily. 03/12/24   Lue Elsie BROCKS, MD  enoxaparin  (LOVENOX ) 60 MG/0.6ML injection Inject 0.5 mLs (50 mg total) into the skin every 12 (twelve) hours. Patient not taking: Reported on 06/01/2024 05/18/24   Raenelle Coria, MD  ferrous sulfate  325 (65 FE) MG tablet Take 325 mg by mouth daily with breakfast.    [provider]  FLUoxetine  (PROZAC ) 20 MG capsule Take 20 mg by mouth daily.    [provider]  magnesium  30 MG tablet Take 60 mg by mouth at bedtime.    [provider]  Multiple Vitamins-Minerals (MULTIVITAMIN WITH MINERALS) tablet Take 1 tablet by mouth daily with breakfast.    [provider]  oxyCODONE  (OXY IR/ROXICODONE ) 5 MG immediate release tablet Take 1 tablet (5 mg total) by mouth every 6 (six) hours as needed for severe pain (pain score 7-10). Patient not taking: Reported on 06/01/2024 05/18/24   Raenelle Coria, MD  triamterene -hydrochlorothiazide  (MAXZIDE -25) 37.5-25 MG per tablet Take 1 tablet by mouth daily.      [provider]  TYLENOL  325 MG tablet Take 325-650 mg by mouth every 6 (six) hours as needed for mild pain (pain score  1-3) or headache.    [provider]  valsartan (DIOVAN) 320 MG tablet Take 320 mg  by mouth daily. 12/31/23   [provider]     Critical care time: na     Mod MDM    Ronnald Gave MSN, AGACNP-BC [facility] Pulmonary/Critical Care Medicine Amion for pager  06/14/2024, 1:43 PM

## 2024-06-15 ENCOUNTER — Ambulatory Visit: Admitting: Internal Medicine

## 2024-06-15 ENCOUNTER — Ambulatory Visit
Admit: 2024-06-15 | Discharge: 2024-06-15 | Disposition: A | Discharge status: Home / Self Care | Attending: Radiation Oncology | Admitting: Radiation Oncology

## 2024-06-15 ENCOUNTER — Other Ambulatory Visit: Payer: Self-pay

## 2024-06-15 ENCOUNTER — Inpatient Hospital Stay: Admitting: Nurse Practitioner

## 2024-06-15 ENCOUNTER — Telehealth: Payer: Self-pay | Admitting: Licensed Clinical Social Worker

## 2024-06-15 ENCOUNTER — Ambulatory Visit
Admission: RE | Admit: 2024-06-15 | Discharge: 2024-06-15 | Disposition: A | Discharge status: Home / Self Care | Source: Ambulatory Visit | Attending: Radiation Oncology | Admitting: Radiation Oncology

## 2024-06-15 DIAGNOSIS — D638 Anemia in other chronic diseases classified elsewhere: Secondary | ICD-10-CM | POA: Diagnosis not present

## 2024-06-15 DIAGNOSIS — J9620 Acute and chronic respiratory failure, unspecified whether with hypoxia or hypercapnia: Secondary | ICD-10-CM | POA: Diagnosis present

## 2024-06-15 DIAGNOSIS — I2782 Chronic pulmonary embolism: Secondary | ICD-10-CM | POA: Diagnosis not present

## 2024-06-15 DIAGNOSIS — G9341 Metabolic encephalopathy: Secondary | ICD-10-CM | POA: Diagnosis not present

## 2024-06-15 DIAGNOSIS — Z681 Body mass index (BMI) 19 or less, adult: Secondary | ICD-10-CM | POA: Diagnosis not present

## 2024-06-15 DIAGNOSIS — C7931 Secondary malignant neoplasm of brain: Secondary | ICD-10-CM | POA: Diagnosis not present

## 2024-06-15 DIAGNOSIS — E7800 Pure hypercholesterolemia, unspecified: Secondary | ICD-10-CM | POA: Diagnosis not present

## 2024-06-15 DIAGNOSIS — C3431 Malignant neoplasm of lower lobe, right bronchus or lung: Secondary | ICD-10-CM | POA: Diagnosis not present

## 2024-06-15 DIAGNOSIS — F419 Anxiety disorder, unspecified: Secondary | ICD-10-CM | POA: Diagnosis not present

## 2024-06-15 DIAGNOSIS — Z95828 Presence of other vascular implants and grafts: Secondary | ICD-10-CM | POA: Diagnosis not present

## 2024-06-15 DIAGNOSIS — Z9981 Dependence on supplemental oxygen: Secondary | ICD-10-CM | POA: Diagnosis not present

## 2024-06-15 DIAGNOSIS — R54 Age-related physical debility: Secondary | ICD-10-CM | POA: Diagnosis not present

## 2024-06-15 DIAGNOSIS — R64 Cachexia: Secondary | ICD-10-CM | POA: Diagnosis not present

## 2024-06-15 DIAGNOSIS — J449 Chronic obstructive pulmonary disease, unspecified: Secondary | ICD-10-CM | POA: Diagnosis not present

## 2024-06-15 DIAGNOSIS — J9621 Acute and chronic respiratory failure with hypoxia: Secondary | ICD-10-CM | POA: Diagnosis not present

## 2024-06-15 DIAGNOSIS — J439 Emphysema, unspecified: Secondary | ICD-10-CM | POA: Diagnosis not present

## 2024-06-15 DIAGNOSIS — Z79899 Other long term (current) drug therapy: Secondary | ICD-10-CM | POA: Diagnosis not present

## 2024-06-15 DIAGNOSIS — R0602 Shortness of breath: Secondary | ICD-10-CM | POA: Diagnosis not present

## 2024-06-15 DIAGNOSIS — I10 Essential (primary) hypertension: Secondary | ICD-10-CM | POA: Diagnosis not present

## 2024-06-15 DIAGNOSIS — C7951 Secondary malignant neoplasm of bone: Secondary | ICD-10-CM | POA: Diagnosis not present

## 2024-06-15 DIAGNOSIS — Z7901 Long term (current) use of anticoagulants: Secondary | ICD-10-CM | POA: Diagnosis not present

## 2024-06-15 DIAGNOSIS — E43 Unspecified severe protein-calorie malnutrition: Secondary | ICD-10-CM | POA: Diagnosis not present

## 2024-06-15 DIAGNOSIS — Z87891 Personal history of nicotine dependence: Secondary | ICD-10-CM | POA: Diagnosis not present

## 2024-06-15 DIAGNOSIS — I731 Thromboangiitis obliterans [Buerger's disease]: Secondary | ICD-10-CM | POA: Diagnosis not present

## 2024-06-15 DIAGNOSIS — Z510 Encounter for antineoplastic radiation therapy: Secondary | ICD-10-CM | POA: Insufficient documentation

## 2024-06-15 DIAGNOSIS — I700 Atherosclerosis of aorta: Secondary | ICD-10-CM | POA: Diagnosis not present

## 2024-06-15 DIAGNOSIS — C771 Secondary and unspecified malignant neoplasm of intrathoracic lymph nodes: Secondary | ICD-10-CM | POA: Diagnosis not present

## 2024-06-15 LAB — RAD ONC ARIA SESSION SUMMARY
Course Elapsed Days: 0
Plan Fractions Treated to Date: 1
Plan Prescribed Dose Per Fraction: 3 Gy
Plan Total Fractions Prescribed: 10
Plan Total Prescribed Dose: 30 Gy
Reference Point Dosage Given to Date: 3 Gy
Reference Point Session Dosage Given: 3 Gy
Session Number: 1

## 2024-06-15 LAB — COMPREHENSIVE METABOLIC PANEL WITH GFR
ALT: 10 U/L (ref 0–44)
AST: 13 U/L — ABNORMAL LOW (ref 15–41)
Albumin: 3 g/dL — ABNORMAL LOW (ref 3.5–5.0)
Alkaline Phosphatase: 71 U/L (ref 38–126)
Anion gap: 13 (ref 5–15)
BUN: 14 mg/dL (ref 8–23)
CO2: 22 mmol/L (ref 22–32)
Calcium: 9.7 mg/dL (ref 8.9–10.3)
Chloride: 101 mmol/L (ref 98–111)
Creatinine, Ser: 0.68 mg/dL (ref 0.44–1.00)
GFR, Estimated: >60 mL/min (ref >60)
Glucose, Bld: 81 mg/dL (ref 70–99)
Potassium: 3.7 mmol/L (ref 3.5–5.1)
Sodium: 136 mmol/L (ref 135–145)
Total Bilirubin: 0.7 mg/dL (ref 0.0–1.2)
Total Protein: 7.3 g/dL (ref 6.5–8.1)

## 2024-06-15 LAB — CBC
HCT: 29.5 % — ABNORMAL LOW (ref 36.0–46.0)
Hemoglobin: 9.3 g/dL — ABNORMAL LOW (ref 12.0–15.0)
MCH: 25.8 pg — ABNORMAL LOW (ref 26.0–34.0)
MCHC: 31.5 g/dL (ref 30.0–36.0)
MCV: 81.9 fL (ref 80.0–100.0)
Platelets: 356 K/uL (ref 150–400)
RBC: 3.6 MIL/uL — ABNORMAL LOW (ref 3.87–5.11)
RDW: 16.6 % — ABNORMAL HIGH (ref 11.5–15.5)
WBC: 5.3 K/uL (ref 4.0–10.5)
nRBC: 0 % (ref 0.0–0.2)

## 2024-06-15 MED ORDER — OXYCODONE HCL 5 MG PO TABS: 5.0000 mg | ORAL_TABLET | Freq: Four times a day (QID) | ORAL | Status: DC | PRN

## 2024-06-15 MED ORDER — ENSURE PLUS HIGH PROTEIN PO LIQD
237.0000 mL | Freq: Two times a day (BID) | ORAL | Status: DC
  Administered 2024-06-16 (×2): 237 mL via ORAL

## 2024-06-15 NOTE — TOC Initial Note (Signed)
 Transition of Care [name]) - Initial/Assessment Note   Patient Details  Name: Kristina Howe MRN: [id number] Date of Birth: 05-19-1961  [facility]) CM/SW Contact:    Kristina GORMAN Aran, LCSW Phone Number: 06/15/2024, 9:59 AM  Clinical Narrative: Patient is from home with significant other. Patient was recently discharged home on 2L/min home oxygen through [employer], but is currently requiring 4L/min. TOC to follow for possible discharge needs.  Expected Discharge Plan: Home/Self Care Barriers to Discharge: Continued Medical Work up  Expected Discharge Plan and Services In-house Referral: Clinical Social Work Living arrangements for the past 2 months: Single Family Home  Prior Living Arrangements/Services Living arrangements for the past 2 months: Single Family Home Lives with:: Significant Other Patient language and need for interpreter reviewed:: Yes Do you feel safe going back to the place where you live?: Yes      Need for Family Participation in Patient Care: No (Comment) Care giver support system in place?: Yes (comment) Current home services: DME (2L/min home oxygen through [employer]) Criminal Activity/Legal Involvement Pertinent to Current Situation/Hospitalization: No - Comment as needed  Activities of Daily Living ADL Screening (condition at time of admission) Independently performs ADLs?: Yes (appropriate for developmental age) Is the patient deaf or have difficulty hearing?: Yes Does the patient have difficulty seeing, even when wearing glasses/contacts?: No Does the patient have difficulty concentrating, remembering, or making decisions?: No  Emotional Assessment Orientation: : Oriented to Self, Oriented to Place, Oriented to  Time, Oriented to Situation Alcohol / Substance Use: Tobacco Use Psych Involvement: No (comment)  Admission diagnosis:  Acute on chronic respiratory failure with hypoxia (HCC) [J96.21] Acute hypoxic respiratory failure (HCC) [J96.01] Patient  Active Problem List   Diagnosis Date Noted   Anemia 06/14/2024   Acute on chronic respiratory failure with hypoxia (HCC) 06/14/2024   Chronic respiratory failure with hypoxia (HCC) 06/01/2024   Hypoxemia 05/17/2024   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 [place] [address] Phone: [phone] Fax: [phone]  Social Drivers of Health (SDOH) Social History: SDOH Screenings   Food Insecurity: No Food Insecurity (06/14/2024)  Housing: Low Risk  (06/14/2024)  Transportation Needs: No Transportation Needs (06/14/2024)  Utilities: Not At Risk (06/14/2024)  Depression (PHQ2-9): Low Risk  (05/11/2024)  Tobacco Use: Medium Risk (06/14/2024)   SDOH Interventions:    Readmission Risk Interventions    06/02/2024    2:39 PM  Readmission Risk Prevention Plan  Transportation Screening Complete  PCP or Specialist Appt within 3-5 Days Complete  HRI or Home Care Consult Complete  Social Work Consult for Recovery Care Planning/Counseling Complete  Palliative Care Screening Complete  Medication Review Oceanographer) Complete

## 2024-06-15 NOTE — Progress Notes (Addendum)
 The patent request for Code status to be changed from DNR to Full Code. Patients sister is at bedside, both request for DNR arm bracelet removal. Hospitalist provider updated regarding patients request.

## 2024-06-15 NOTE — Telephone Encounter (Signed)
 CSW spoke w/ pt's significant other regarding filing for SSDI. Per [place]l he spoke with someone in [place] who informed him the app has been started. Pt's employer may have initiated the application on behalf of pt. CSW to remain avail as appropriate.

## 2024-06-15 NOTE — Progress Notes (Signed)
 PROGRESS NOTE    Kristina Howe  FMW:[id number] DOB: 1961-08-18 DOA: 06/14/2024 PCP: [name] BROCKS, FNP   Brief Narrative:  HPI: Kristina Howe is a 63 y.o. 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   Patient admitted 6/9-6/10 with PE started on anticoagulation.  Readmitted 6/24-6/27 with acute respiratory failure likely secondary to pneumonia in the setting of recent PE and lung cancer.  Improved with antibiotics and discharged on 2 L supplemental oxygen.   Has had some gradual shortness of breath since that time but it abruptly increased for the past day.  Noted to have increased home oxygen from 2 to 4 L maintain saturations.  Also reportedly had increased cough and sputum production.  Also reportedly had some increased confusion after starting the radiation therapy for her brain metastases.   Patient denies fevers, chill, abdominal pain, nausea.   ED Course: Vital signs in the ED notable for heart rate in the 70s-120s, blood pressure in the 110s-130s systolic, respirate in the 20s-30s.  Initially on 4 L of oxygen and then transition to heated high flow.   Lab workup included CMP with sodium 134, bicarb 20, albumin  2.9.  CBC with hemoglobin 10.4.  Troponin negative x 2.  Urinalysis, UDS, ammonia level pending.  Ethanol level negative.  VBG with pH 7.467 and PCO2 30.9.   Imaging included CT head which showed area that corresponds to previous mets on MRI but no acute dramality.  Chest x-ray showed stable changes from 6/24 with right middle lobe and right lower lobe atelectasis with bulky adenopathy.  CTA PE study showed small nonocclusive right upper lobe PE similar to previous but no new PE.  Also noted was complete occlusion of bronchus intermedius and the right  middle lobe and right lower lobe bronchi with collapse of the right middle lobe and right lower lobe.   Patient received droperidol , Ativan , Solu-Medrol , albuterol  x 2, Atrovent .  Pulmonology/critical care was consulted for possible bronchoscopy but on their evaluation CT shows that bronchus occlusion is not secondary to mucous plugging but is instead due to mass effect from lung cancer.  Recommending some hypertonic saline and other interventions but will need to get in touch with patient's cancer team and palliative team for plan.  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 > Patient presenting with progressive respiratory failure in the setting of progressive lung cancer, recent pneumonia, recent pulmonary embolism, emphysema without diagnosis of COPD. > After last admission was discharged on 2 L and now is requiring 4 L supplemental oxygen.  > Imaging showed bulky adenopathy with complete occlusion of bronchus intermedius, right middle lobe and right lower lobe bronchus.  With collapse of the right middle lobe and right lower lobe. Imaging showed stable PE with no new PE. Pulmonology consulted and given cause is not due to mucous plugging that is due to mass effect have reached out to oncology team and awaiting hear back.  Otherwise recommending saline nebulization and Mucinex  for further optimization.  Case was discussed with oncology, per them, radiation oncology will be seeing the patient while admitted.  Per their request, patient was transferred from [facility] to [facility] and is supposed to have radiation today.  She has been weaned down to 2 L oxygen now.   Acute  metabolic encephalopathy, POA > Has been having some confusion after starting radiotherapy for brain metastases.  Likely related to this treatment.  No acute abnormality on CT head.  Patient is fully alert and oriented.  Ammonia normal.  Hypertension: Controlled.  Continue irbesartan .  Hyperlipidemia - Continue statin   Anemia Hemoglobin stable.  Monitor daily.   Anxiety - Continue home Xanax  and fluoxetine    History of subclavian stenosis - Status post stenting   History of Buerger's disease - Noted  DVT prophylaxis:    Code Status: Do not attempt resuscitation (DNR) PRE-ARREST INTERVENTIONS DESIRED  Family Communication:  None present at bedside.  Plan of care discussed with patient in length and he/she verbalized understanding and agreed with it.  Status is: Observation The patient will require care spanning > 2 midnights and should be moved to inpatient because: Patient sick, is requiring inpatient radiation.   Estimated body mass index is 14.72 kg/m as calculated from the following:   Height as of this encounter: 5' 7 (1.702 m).   Weight as of this encounter: 42.6 kg.    Nutritional Assessment: Body mass index is 14.72 kg/m.Kristina Howe Seen by dietician.  I agree with the assessment and plan as outlined below: Nutrition Status:        . Skin Assessment: I have examined the patient's skin and I agree with the wound assessment as performed by the wound care RN as outlined below:    Consultants:  Oncology, pulmonary and radiation oncology  Procedures:  As above  Antimicrobials:  Anti-infectives (From admission, onward)    None         Subjective: Patient seen and examined, she states that her breathing is much better, she has weaned down to 2 L of oxygen.  No new complaint.  She is fully alert and oriented.  Objective: Vitals:   06/14/24 1615 06/14/24 2041 06/15/24 0019 06/15/24 0411  BP: 137/84 (!) 145/90 (!) 135/91 (!) 141/88  Pulse: (!) 101 87 82 77   Resp: 18 20 20 20   Temp: 98.1 F (36.7 C) 97.7 F (36.5 C) 97.8 F (36.6 C) 98 F (36.7 C)  TempSrc: Oral     SpO2:  95% 97% 100%  Weight:      Height:       No intake or output data in the 24 hours ending 06/15/24 1010 Filed Weights   06/14/24 0800  Weight: 42.6 kg    Examination:  General exam: Appears calm and comfortable  Respiratory system: Clear to auscultation. Respiratory effort normal. Cardiovascular system: S1 & S2 heard, RRR. No JVD, murmurs, rubs, gallops or clicks. No pedal edema. Gastrointestinal system: Abdomen is nondistended, soft and nontender. No organomegaly or masses felt. Normal bowel sounds heard. Central nervous system: Alert and oriented. No focal neurological deficits. Extremities: Symmetric 5 x 5 power. Skin: No rashes, lesions or ulcers Psychiatry: Judgement and insight appear normal. Mood & affect appropriate.    Data Reviewed: I have personally reviewed following labs and imaging studies  CBC: Recent Labs  Lab 06/14/24 0830 06/14/24 0956 06/14/24 1028 06/15/24 0402  WBC 4.5  --   --  5.3  NEUTROABS 3.5  --   --   --  HGB 10.4* 11.6* 11.2* 9.3*  HCT 33.7* 34.0* 33.0* 29.5*  MCV 83.2  --   --  81.9  PLT 372  --   --  356   Basic Metabolic Panel: Recent Labs  Lab 06/14/24 0830 06/14/24 0956 06/14/24 1028 06/15/24 0402  NA 134* 133* 134* 136  K 3.9 3.7 3.6 3.7  CL 99 101  --  101  CO2 20*  --   --  22  GLUCOSE 96 119*  --  81  BUN 9 10  --  14  CREATININE 0.74 0.70  --  0.68  CALCIUM  10.0  --   --  9.7   GFR: Estimated Creatinine Clearance: 49.7 mL/min (by C-G formula based on SCr of 0.68 mg/dL). Liver Function Tests: Recent Labs  Lab 06/14/24 0830 06/15/24 0402  AST 19 13*  ALT 11 10  ALKPHOS 65 71  BILITOT 0.6 0.7  PROT 7.4 7.3  ALBUMIN  2.9* 3.0*   No results for input(s): LIPASE, AMYLASE in the last 168 hours. Recent Labs  Lab 06/14/24 1145  AMMONIA <13   Coagulation Profile: No results for input(s):  INR, PROTIME in the last 168 hours. Cardiac Enzymes: No results for input(s): CKTOTAL, CKMB, CKMBINDEX, TROPONINI in the last 168 hours. BNP (last 3 results) No results for input(s): PROBNP in the last 8760 hours. HbA1C: No results for input(s): HGBA1C in the last 72 hours. CBG: No results for input(s): GLUCAP in the last 168 hours. Lipid Profile: No results for input(s): CHOL, HDL, LDLCALC, TRIG, CHOLHDL, LDLDIRECT in the last 72 hours. Thyroid Function Tests: No results for input(s): TSH, T4TOTAL, FREET4, T3FREE, THYROIDAB in the last 72 hours. Anemia Panel: No results for input(s): VITAMINB12, FOLATE, FERRITIN, TIBC, IRON, RETICCTPCT in the last 72 hours. Sepsis Labs: No results for input(s): PROCALCITON, LATICACIDVEN in the last 168 hours.  No results found for this or any previous visit (from the past 240 hours).   Radiology Studies: CT Angio Chest PE W and/or Wo Contrast Result Date: 06/14/2024 CLINICAL DATA:  Concern for pulmonary embolism.  Lung cancer. EXAM: CT ANGIOGRAPHY CHEST WITH CONTRAST TECHNIQUE: Multidetector CT imaging of the chest was performed using the standard protocol during bolus administration of intravenous contrast. Multiplanar CT image reconstructions and MIPs were obtained to evaluate the vascular anatomy. RADIATION DOSE REDUCTION: This exam was performed according to the departmental dose-optimization program which includes automated exposure control, adjustment of the mA and/or kV according to patient size and/or use of iterative reconstruction technique. CONTRAST:  75mL OMNIPAQUE  IOHEXOL  350 MG/ML SOLN COMPARISON:  CT dated 05/24/2024. FINDINGS: Cardiovascular: There is no cardiomegaly. Interval increase in the size of the pericardial effusion measuring approximately 1 cm in thickness anterior to the heart. There is mild atherosclerotic calcification of the thoracic aorta. No aneurysmal dilatation or  dissection. Stents in the proximal right common carotid and subclavian arteries noted. The origins of the great vessels of the aortic arch are patent. Small nonocclusive right upper lobe segmental pulmonary artery embolus similar to prior CT. No new pulmonary embolus. No evidence of right heart straining. Mediastinum/Nodes: Bulky hilar and mediastinal adenopathy progressed since the prior CT. Bilateral supraclavicular adenopathy noted. There is mass effect and compression of the midportion of the esophagus by the mediastinal adenopathy. No mediastinal fluid collection. Lungs/Pleura: There is complete occlusion of the bronchus intermedius and right middle and right lower lobe bronchi with complete collapse of the right middle and right lower lobes. This is likely related to tumor invasion. There is  a small right pleural effusion. There is background of emphysema. Linear scarring in the left apex. No pneumothorax. Upper Abdomen: No acute abnormality. Musculoskeletal: Osteopenia.  No acute osseous pathology. Review of the MIP images confirms the above findings. IMPRESSION: 1. Small nonocclusive right upper lobe segmental pulmonary artery embolus similar to prior CT. No new pulmonary embolus. No evidence of right heart straining. 2. Complete occlusion of the bronchus intermedius and right middle and right lower lobe bronchi with complete collapse of the right middle and right lower lobes. 3. Small right pleural effusion. 4. Bulky hilar and mediastinal adenopathy progressed since the prior CT. 5. Aortic Atherosclerosis (ICD10-I70.0) and Emphysema (ICD10-J43.9). Electronically Signed   By: Vanetta Chou M.D.   On: 06/14/2024 11:55   CT Head Wo Contrast Result Date: 06/14/2024 CLINICAL DATA:  Mental status change, unknown cause EXAM: CT HEAD WITHOUT CONTRAST TECHNIQUE: Contiguous axial images were obtained from the base of the skull through the vertex without intravenous contrast. RADIATION DOSE REDUCTION: This exam  was performed according to the departmental dose-optimization program which includes automated exposure control, adjustment of the mA and/or kV according to patient size and/or use of iterative reconstruction technique. COMPARISON:  MRI of the head dated May 26, 2024. FINDINGS: Brain: Age-related cerebral volume loss. Area of diminished attenuation within the left posterior temporal lobe corresponding with the ring-enhancing lesion noted on the previous MRI. There is also moderate subcortical and deep cerebral white matter disease. No discrete lesions are evident within the cerebellar hemispheres. Vascular: Negative. Skull: Unremarkable. Sinuses/Orbits: Normal. Other: None. IMPRESSION: 1. Focal area of diminished attenuation at the left temporal occipital junction corresponding with the likely metastatic lesion seen on previous MRI. The other lesions are not evident, but the current study is limited by the absence of intravenous contrast. 2. Moderate cerebral white matter disease. Electronically Signed   By: Evalene Coho M.D.   On: 06/14/2024 11:54   DG Chest Portable 1 View Result Date: 06/14/2024 CLINICAL DATA:  Shortness of breath, known stage IV lung cancer and COPD EXAM: PORTABLE CHEST 1 VIEW COMPARISON:  Prior chest x-ray 06/01/2024 FINDINGS: Unchanged contour of the mediastinum consistent with bulky mediastinal lymphadenopathy. Chronic bronchitic changes are similar compared to prior. Probable right middle and right lower lobe atelectasis is similar compared to prior. No new effusion or pneumothorax. No acute osseous abnormality. IMPRESSION: Stable appearance of the chest without significant interval change compared to 06/01/2024. Right middle and right lower lobe atelectasis with bulky mediastinal adenopathy. Electronically Signed   By: Wilkie Lent M.D.   On: 06/14/2024 08:15    Scheduled Meds:  atorvastatin   40 mg Oral Daily   enoxaparin   50 mg Subcutaneous Q12H   FLUoxetine   20 mg Oral  Daily   guaiFENesin   600 mg Oral BID   irbesartan   300 mg Oral Daily   sodium chloride  flush  3 mL Intravenous Q12H   sodium chloride  HYPERTONIC  4 mL Nebulization Daily   Continuous Infusions:   LOS: 0 days   Fredia Skeeter, MD [facility]  06/15/2024, 10:10 AM   *Please note that this is a verbal dictation therefore any spelling or grammatical errors are due to the Dragon Medical One system interpretation.  Please page via Amion and do not message via secure chat for urgent patient care matters. Secure chat can be used for non urgent patient care matters.  How to contact the [facility] Attending or Consulting provider 7A - 7P or covering provider during after hours 7P -7A, for  this patient?  Check the care team in [facility] and look for a) attending/consulting [facility] provider listed and b) the [facility] team listed. Page or secure chat 7A-7P. Log into [email or web address] and use [facility] universal password to access. If you do not have the password, please contact the hospital operator. Locate the [facility] provider you are looking for under [facility] and page to a number that you can be directly reached. If you still have difficulty reaching the provider, please page the [facility] (Director on Call) for the Hospitalists listed on amion for assistance.

## 2024-06-15 NOTE — Consult Note (Addendum)
 Radiation Oncology         [phone] ________________________________  Name: Kristina Howe        MRN: [id number]  Date of Service: 06/15/24 DOB: Apr 27, 1961  CC:Kristina Bernardino BROCKS, FNP   REFERRING PHYSICIAN: Dr. Odean   DIAGNOSIS: The encounter diagnosis was Acute hypoxic respiratory failure (HCC).   HISTORY OF PRESENT ILLNESS: Kristina Howe is a 63 y.o. female known in our clinic for a recently diagnosed lung cancer. The patient presented after having unintentional weight loss and abdominal pain and was seen by GI.  Her workup included CT abdomen and pelvis on 12/25/2023 that showed no acute abnormality but some asked symmetric distal esophageal wall thickening and an indeterminate 10 mm left lower renal lesion.  She underwent an MRI of the abdomen with and without contrast that showed multiple small fluid signals of benign appearing fluid-filled cysts in the left kidney with no other of acute findings in other abdominal organs.  She presented to the emergency department with discoloration and swelling of her fingers and a CT angiography of the right upper extremity showed no vascular anomalies but multiple nodules in the right upper lobe and evidence of emphysema.  CT angiography of the chest on 03/09/2024 showed multiple pulmonary nodules that were indeterminate but concerns for adenopathy in the mediastinum and high-grade subclavian artery stenosis due to an atherosclerotic plaque and associated free-floating thrombus with this plaque.  A suspected atrial septal defect was noted and acute to subacute pulmonary emboli with small embolic burden were noted.  A CT super D scan showed bulky subcarinal, and right paratracheal adenopathy.  She had a right upper lobe nodule measuring 6 mm, a right lower lobe nodule measuring 13 mm, and  she underwent bronchoscopy with Dr. Shelah on 04/26/2024 and the right lower lobe was the target lesion, fine-needle aspirate and brushings were negative but biopsy of the  right bronchus intermedius showed a non-small cell lung cancer as did brushings of the site most consistent with squamous cell features lavage also confirmed this finding and left mainstem biopsy showed non-small cell carcinoma as well as the brushings morphologically favoring poorly differentiated adenocarcinoma.  A level 4R lymph node also showed non-small cell lung cancer.  She was going for a PET scan and MRI on 05/17/2024.  The MRI of the brain showed concerns for a rim-enhancing lesion in the left temporo-occipital junction suspicious for metastatic disease and a concern for a 5 mm focus of enhancement in the superior right cerebellum.  Her PET scan that day however was not performed due to onset of shortness of breath and she was taken to the emergency room and CT angiography again showed small to moderate volume nonocclusive embolism involving the segmental and proximal subsegmental pulmonary artery branches throughout bilateral lungs with no evidence of heart strain or infarct.  Extensive mediastinal soft tissue disease surrounding the aorta was appreciated and frothy soft tissue in the bronchus intermedius, right lower lobe bronchial tree favoring mucus or secretion and soft tissue nodular thickening along the left wall of the bronchus intermedius with stable nodules otherwise.  Her PET scan on 05/21/2024 showed extensive hypermetabolic nodal enlargement throughout the mediastinum, hilum, thoracic inlet, right supraclavicular and left chest wall as well as filling defects along the central bronchi of the lungs and a focus of uptake in the left iliac bone.  Her 3T MRI confirmed 4 areas of enhancement within the right cerebral hemisphere suspicious for metastatic disease, and she subsequently underwent SRS treatment  in 3 fractions which she completed on 06/07/2024.  In the interim she was apparently diagnosed with a pulmonary embolism and started on anticoagulation.  She presented yesterday to the emergency room  complaining of increasing shortness of breath, and a CT of the chest with angiography showed a small nonocclusive right upper lobe segmental pulmonary artery embolism similar to a prior CT scan, as well as complete occlusion of the bronchus intermedius and right middle and lower lobe bronchi with complete collapse of the right middle and lower lobes, a small right effusion was noted as well as bulky hilar and mediastinal adenopathy that had progressed since her prior imaging.  Given these findings, we have been asked to consider palliative radiation.  She was originally supposed to get started with chemotherapy but is seeing Kristina Howe back next week.        PREVIOUS RADIATION THERAPY: Yes   06/02/24-06/07/24  SRS Treatment Plan Name: Brain_SRT Site: Brain PTV_1_TemporalLt_21mm PTV_2_CerebellR_37mm  PTV_3_CerebellR_66mm PTV_4_CerebellR_67mm PTV_5_CerebellR_2.59mm Technique: SBRT/SRT-IMRT Mode: Photon Dose Per Fraction: 9 Gy Prescribed Dose (Delivered / Prescribed): 27 Gy / 27 Gy Prescribed Fxs (Delivered / Prescribed): 3 / 3    PAST MEDICAL HISTORY:  Past Medical History:  Diagnosis Date   Anemia 04/02/2024   hgb = 9   Anxiety    tx w/ xanax  and celexa    Cancer (HCC)    Elevated cholesterol    Hypertension    Subclavian artery stenosis, right (HCC) 04/19/2024   right forearm pain;  thrombophlebitis of her superficial basilic forearm veins- instructed her to take ASA 81 mg & warm compresses       PAST SURGICAL HISTORY: Past Surgical History:  Procedure Laterality Date   ABDOMINAL HYSTERECTOMY     AORTOGRAM N/A 03/11/2024   Procedure: AORTIC ARCH ANGIOGRAM;  Surgeon: Pearline Norman RAMAN, MD;  Location: [facility] OR;  Service: Vascular;  Laterality: N/A;   BRONCHIAL BIOPSY  04/26/2024   Procedure: BRONCHOSCOPY, WITH BIOPSY;  Surgeon: Shelah Lamar RAMAN, MD;  Location: [facility] ENDOSCOPY;  Service: Pulmonary;;   BRONCHIAL BRUSHINGS  04/26/2024   Procedure: BRONCHOSCOPY, WITH BRUSH BIOPSY;   Surgeon: Shelah Lamar RAMAN, MD;  Location: [facility]OSCOPY;  Service: Pulmonary;;   BRONCHIAL NEEDLE ASPIRATION BIOPSY  04/26/2024   Procedure: BRONCHOSCOPY, WITH NEEDLE ASPIRATION BIOPSY;  Surgeon: Shelah Lamar RAMAN, MD;  Location: [facility] ENDOSCOPY;  Service: Pulmonary;;   BRONCHIAL WASHINGS  04/26/2024   Procedure: IRRIGATION, BRONCHUS;  Surgeon: Shelah Lamar RAMAN, MD;  Location: [facility] ENDOSCOPY;  Service: Pulmonary;;   BRONCHOSCOPY, WITH BIOPSY USING ELECTROMAGNETIC NAVIGATION Right 04/26/2024   Procedure: BRONCHOSCOPY, WITH BIOPSY USING ELECTROMAGNETIC NAVIGATION;  Surgeon: Shelah Lamar RAMAN, MD;  Location: [facility] ENDOSCOPY;  Service: Pulmonary;  Laterality: Right;   COLONOSCOPY  2024   INSERTION OF RETROGRADE CAROTID STENT Right 03/11/2024   Procedure: INSERTION RIGHT SUBCLAVIAN ARTERY AND RIGHT COMMON CAROTID ARTERY STENTS;  Surgeon: Pearline Norman RAMAN, MD;  Location: [facility] OR;  Service: Vascular;  Laterality: Right;   THORACIC EXPOSURE Right 03/11/2024   Procedure: DIRECT EXPOSURE OF RIGHT COMMON CAROTID ARTERY;  Surgeon: Pearline Norman RAMAN, MD;  Location: [facility] OR;  Service: Vascular;  Laterality: Right;   ULTRASOUND GUIDANCE FOR VASCULAR ACCESS Right 03/11/2024   Procedure: ULTRASOUND GUIDANCE, FOR VASCULAR ACCESS, RIGHT RADIAL ARTERY AND RIGHT FEMORAL ARTERY;  Surgeon: Pearline Norman RAMAN, MD;  Location: [facility] OR;  Service: Vascular;  Laterality: Right;   UPPER EXTREMITY ANGIOGRAM Right 03/11/2024   Procedure: GERALYN, RIGHT UPPER EXTREMITY;  Surgeon: Pearline Norman RAMAN, MD;  Location:  [facility] OR;  Service: Vascular;  Laterality: Right;   UPPER GI ENDOSCOPY  2022   VIDEO BRONCHOSCOPY WITH ENDOBRONCHIAL ULTRASOUND Right 04/26/2024   Procedure: BRONCHOSCOPY, WITH EBUS;  Surgeon: Shelah Lamar RAMAN, MD;  Location: [facility] ENDOSCOPY;  Service: Pulmonary;  Laterality: Right;  Possible robotic navigation as well depending on CT results     FAMILY HISTORY: History reviewed. No pertinent family history.   SOCIAL HISTORY:  reports that she  quit smoking about 30 years ago. Her smoking use included cigarettes. She started smoking about 2 months ago. She has never been exposed to tobacco smoke. She has never used smokeless tobacco. She reports that she does not currently use alcohol. She reports that she does not use drugs. The patient is single but in a relationship.    ALLERGIES: Patient has no known allergies.   MEDICATIONS:  Current Facility-Administered Medications  Medication Dose Route Frequency Provider Last Rate Last Admin   acetaminophen  (TYLENOL ) tablet 650 mg  650 mg Oral Q6H PRN Seena Marsa NOVAK, MD       Or   acetaminophen  (TYLENOL ) suppository 650 mg  650 mg Rectal Q6H PRN Seena Marsa NOVAK, MD       ALPRAZolam  (XANAX ) tablet 1 mg  1 mg Oral TID PRN Melvin, Alexander B, MD   1 mg at 06/14/24 2057   atorvastatin  (LIPITOR) tablet 40 mg  40 mg Oral Daily Melvin, Alexander B, MD       enoxaparin  (LOVENOX ) injection 50 mg  50 mg Subcutaneous Q12H Melvin, Alexander B, MD   50 mg at 06/14/24 2058   FLUoxetine  (PROZAC ) capsule 20 mg  20 mg Oral Daily Melvin, Alexander B, MD       guaiFENesin  (MUCINEX ) 12 hr tablet 600 mg  600 mg Oral BID Bowser, Grace E, NP   600 mg at 06/14/24 2102   ipratropium-albuterol  (DUONEB) 0.5-2.5 (3) MG/3ML nebulizer solution 3 mL  3 mL Nebulization Q4H PRN Seena Marsa NOVAK, MD       irbesartan  (AVAPRO ) tablet 300 mg  300 mg Oral Daily Melvin, Alexander B, MD       polyethylene glycol (MIRALAX  / GLYCOLAX ) packet 17 g  17 g Oral Daily PRN Seena Marsa NOVAK, MD       sodium chloride  flush (NS) 0.9 % injection 3 mL  3 mL Intravenous Q12H Seena Marsa NOVAK, MD   3 mL at 06/14/24 2258   sodium chloride  HYPERTONIC 3 % nebulizer solution 4 mL  4 mL Nebulization Daily Bowser, Ronnald BRAVO, NP   4 mL at 06/14/24 1510     REVIEW OF SYSTEMS: On review of systems, the patient reports overall that she has been feeling much more short winded in the last few days.  She has been using 2 L of supplemental  oxygen since her recent diagnosis of PE, she increased her O2 to 4 L to try and maintain saturation and has had increased cough and sputum production.  She also describes some additional confusion since her brain radiation.  No other complaints are verbalized.      PHYSICAL EXAM:  Wt Readings from Last 3 Encounters:  06/14/24 94 lb (42.6 kg)  06/01/24 96 lb 9 oz (43.8 kg)  05/27/24 96 lb 8 oz (43.8 kg)   Temp Readings from Last 3 Encounters:  06/15/24 98 F (36.7 C)  06/07/24 98.3 F (36.8 C) (Temporal)  06/04/24 97.8 F (36.6 C) (Oral)   BP Readings from Last 3 Encounters:  06/15/24 (!) 141/88  06/07/24 122/77  06/04/24 (!) 148/91   Pulse Readings from Last 3 Encounters:  06/15/24 77  06/07/24 89  06/04/24 86   Pain Assessment Pain Score: 0-No pain/10  Unable to assess given encounter type    ECOG = 2  0 - Asymptomatic (Fully active, able to carry on all predisease activities without restriction)  1 - Symptomatic but completely ambulatory (Restricted in physically strenuous activity but ambulatory and able to carry out work of a light or sedentary nature. For example, light housework, office work)  2 - Symptomatic, <50% in bed during the day (Ambulatory and capable of all self care but unable to carry out any work activities. Up and about more than 50% of waking hours)  3 - Symptomatic, >50% in bed, but not bedbound (Capable of only limited self-care, confined to bed or chair 50% or more of waking hours)  4 - Bedbound (Completely disabled. Cannot carry on any self-care. Totally confined to bed or chair)  5 - Death   Raylene MM, Creech RH, Tormey DC, et al. [phone]). Toxicity and response criteria of the [facility]. Am. DOROTHA Bridges. Oncol. 5 (6): 649-55    LABORATORY DATA:  Lab Results  Component Value Date   WBC 4.5 06/14/2024   HGB 11.2 (L) 06/14/2024   HCT 33.0 (L) 06/14/2024   MCV 83.2 06/14/2024   PLT 372 06/14/2024   Lab Results   Component Value Date   NA 134 (L) 06/14/2024   K 3.6 06/14/2024   CL 101 06/14/2024   CO2 20 (L) 06/14/2024   Lab Results  Component Value Date   ALT 11 06/14/2024   AST 19 06/14/2024   ALKPHOS 65 06/14/2024   BILITOT 0.6 06/14/2024      RADIOGRAPHY: CT Angio Chest PE W and/or Wo Contrast Result Date: 06/14/2024 CLINICAL DATA:  Concern for pulmonary embolism.  Lung cancer. EXAM: CT ANGIOGRAPHY CHEST WITH CONTRAST TECHNIQUE: Multidetector CT imaging of the chest was performed using the standard protocol during bolus administration of intravenous contrast. Multiplanar CT image reconstructions and MIPs were obtained to evaluate the vascular anatomy. RADIATION DOSE REDUCTION: This exam was performed according to the departmental dose-optimization program which includes automated exposure control, adjustment of the mA and/or kV according to patient size and/or use of iterative reconstruction technique. CONTRAST:  75mL OMNIPAQUE  IOHEXOL  350 MG/ML SOLN COMPARISON:  CT dated 05/24/2024. FINDINGS: Cardiovascular: There is no cardiomegaly. Interval increase in the size of the pericardial effusion measuring approximately 1 cm in thickness anterior to the heart. There is mild atherosclerotic calcification of the thoracic aorta. No aneurysmal dilatation or dissection. Stents in the proximal right common carotid and subclavian arteries noted. The origins of the great vessels of the aortic arch are patent. Small nonocclusive right upper lobe segmental pulmonary artery embolus similar to prior CT. No new pulmonary embolus. No evidence of right heart straining. Mediastinum/Nodes: Bulky hilar and mediastinal adenopathy progressed since the prior CT. Bilateral supraclavicular adenopathy noted. There is mass effect and compression of the midportion of the esophagus by the mediastinal adenopathy. No mediastinal fluid collection. Lungs/Pleura: There is complete occlusion of the bronchus intermedius and right middle and  right lower lobe bronchi with complete collapse of the right middle and right lower lobes. This is likely related to tumor invasion. There is a small right pleural effusion. There is background of emphysema. Linear scarring in the left apex. No pneumothorax. Upper Abdomen: No acute abnormality. Musculoskeletal: Osteopenia.  No acute osseous pathology. Review of  the MIP images confirms the above findings. IMPRESSION: 1. Small nonocclusive right upper lobe segmental pulmonary artery embolus similar to prior CT. No new pulmonary embolus. No evidence of right heart straining. 2. Complete occlusion of the bronchus intermedius and right middle and right lower lobe bronchi with complete collapse of the right middle and right lower lobes. 3. Small right pleural effusion. 4. Bulky hilar and mediastinal adenopathy progressed since the prior CT. 5. Aortic Atherosclerosis (ICD10-I70.0) and Emphysema (ICD10-J43.9). Electronically Signed   By: Vanetta Chou M.D.   On: 06/14/2024 11:55   CT Head Wo Contrast Result Date: 06/14/2024 CLINICAL DATA:  Mental status change, unknown cause EXAM: CT HEAD WITHOUT CONTRAST TECHNIQUE: Contiguous axial images were obtained from the base of the skull through the vertex without intravenous contrast. RADIATION DOSE REDUCTION: This exam was performed according to the departmental dose-optimization program which includes automated exposure control, adjustment of the mA and/or kV according to patient size and/or use of iterative reconstruction technique. COMPARISON:  MRI of the head dated May 26, 2024. FINDINGS: Brain: Age-related cerebral volume loss. Area of diminished attenuation within the left posterior temporal lobe corresponding with the ring-enhancing lesion noted on the previous MRI. There is also moderate subcortical and deep cerebral white matter disease. No discrete lesions are evident within the cerebellar hemispheres. Vascular: Negative. Skull: Unremarkable. Sinuses/Orbits:  Normal. Other: None. IMPRESSION: 1. Focal area of diminished attenuation at the left temporal occipital junction corresponding with the likely metastatic lesion seen on previous MRI. The other lesions are not evident, but the current study is limited by the absence of intravenous contrast. 2. Moderate cerebral white matter disease. Electronically Signed   By: Evalene Coho M.D.   On: 06/14/2024 11:54   DG Chest Portable 1 View Result Date: 06/14/2024 CLINICAL DATA:  Shortness of breath, known stage IV lung cancer and COPD EXAM: PORTABLE CHEST 1 VIEW COMPARISON:  Prior chest x-ray 06/01/2024 FINDINGS: Unchanged contour of the mediastinum consistent with bulky mediastinal lymphadenopathy. Chronic bronchitic changes are similar compared to prior. Probable right middle and right lower lobe atelectasis is similar compared to prior. No new effusion or pneumothorax. No acute osseous abnormality. IMPRESSION: Stable appearance of the chest without significant interval change compared to 06/01/2024. Right middle and right lower lobe atelectasis with bulky mediastinal adenopathy. Electronically Signed   By: Wilkie Lent M.D.   On: 06/14/2024 08:15   DG Chest Portable 1 View Result Date: 06/01/2024 CLINICAL DATA:  Shortness of breath EXAM: PORTABLE CHEST 1 VIEW COMPARISON:  05/23/2024 FINDINGS: Opacity in the right lower hemithorax could reflect a combination of airspace disease and effusion. Left lung clear. Heart is normal size. Again noted is bulky mediastinal adenopathy as seen on prior CT. No acute bony abnormality. IMPRESSION: New opacification in the right lower chest may reflect a combination of effusion and airspace disease/collapse. Bulky mediastinal adenopathy. Electronically Signed   By: Franky Crease M.D.   On: 06/01/2024 16:17   MR Brain W Wo Contrast Result Date: 05/26/2024 CLINICAL DATA:  Metastatic disease evaluation 3T SRS Protocol for radiation treatment planning EXAM: MRI HEAD WITHOUT AND  WITH CONTRAST TECHNIQUE: Multiplanar, multiecho pulse sequences of the brain and surrounding structures were obtained without and with intravenous contrast. CONTRAST:  5mL GADAVIST  GADOBUTROL  1 MMOL/ML IV SOLN COMPARISON:  MRI of the brain dated May 17, 2024. FINDINGS: Brain: An ovoid peripherally enhancing lesion is again demonstrated left posterior temporal lobe, measuring approximately 12 x 11 x 10 mm. There are additional nodular foci of  enhancement present within the right cerebellar hemisphere, which are concerning for metastatic disease. The largest lesion measures approximately 3 mm in diameter and is seen on image 114 of series 1100. There are smaller focal areas of concerning enhancement present within the right cerebellum on images one hundred three, 105 and 108. There is moderate diffuse periventricular and deep cerebral white matter disease present. Vascular: Normal vascular flow voids. Skull and upper cervical spine: Normal signal intensity. Sinuses/Orbits: Negative. Other: None. IMPRESSION: 1. There are 4 areas of concerning enhancement present within the right cerebellar hemisphere, which are suspicious for metastatic disease. These are in addition to the highly suspicious lesion present within the left posterior temporal lobe. 2. Advanced cerebral white matter disease. Electronically Signed   By: Evalene Coho M.D.   On: 05/26/2024 16:07   CT Angio Chest Pulmonary Embolism (PE) W or WO Contrast Result Date: 05/24/2024 CLINICAL DATA:  63 year old female with lung cancer. Pulmonary emboli, predominantly nonocclusive, affecting bilateral pulmonary arteries on CTA last week. Near syncope. * Tracking Code: BO * EXAM: CT ANGIOGRAPHY CHEST WITH CONTRAST TECHNIQUE: Multidetector CT imaging of the chest was performed using the standard protocol during bolus administration of intravenous contrast. Multiplanar CT image reconstructions and MIPs were obtained to evaluate the vascular anatomy. RADIATION  DOSE REDUCTION: This exam was performed according to the departmental dose-optimization program which includes automated exposure control, adjustment of the mA and/or kV according to patient size and/or use of iterative reconstruction technique. CONTRAST:  75mL OMNIPAQUE  IOHEXOL  350 MG/ML SOLN COMPARISON:  CTA chest 05/17/2024, PET-CT 05/21/2024, and earlier. FINDINGS: Cardiovascular: Excellent contrast bolus timing in the pulmonary arterial tree. Mass effect on the central pulmonary arteries related to bulky mediastinal tumor. Stable to slightly smaller nonocclusive thrombi in left upper lobe branches seen on series 7, images 134, 146. No saddle or lobar embolus. Stable to smaller contralateral right lower lobe nonocclusive PE on series 7, image 175. Stable more occlusive appearing right lower lobe clot image 203. Stable nonocclusive thrombus in branches of the right upper lobe visible on images 120 and 124. But in lateral segment right middle lobe branches there is progressed segmental thrombus series 7, images 179 and 188. Nearby medial segmental clot appears stable on image 189. Heart size remains normal. No pericardial effusion. Calcified aortic atherosclerosis. Proximal great vessels appear to remain patent, the proximal right CCA and subclavian arteries are stented as before. Mediastinum/Nodes: Bulky diffuse mediastinal lobulated tumor, bilateral mediastinal lymphadenopathy. Not significantly changed from the CTA last week or the recent PET-CT. Lungs/Pleura: Mediastinal tumor nearly occludes the bronchus intermedius on series 6, image 60, unchanged from recent exams and distal right middle and lower lobe airway ventilation has mildly improved since 05/17/2024, with less retained secretions. No new airway stenosis. Background centrilobular emphysema in both lungs. Stable ventilation since 05/17/2024. No pneumothorax or pleural effusion. Right lower lobe peribronchial 11 mm lung nodule/mass redemonstrated on  series 6, image 91. Linear left upper lobe probable lung scarring on image 24. Mild scarring versus nodularity in the lateral right upper lobe is stable on image 57. More curvilinear bilateral costophrenic angle scarring is stable. Upper Abdomen: Abdominal Calcified aortic atherosclerosis. Negative partially visible a centrally noncontrast liver, spleen, adrenal glands, left kidney, and bowel in the upper abdomen. Musculoskeletal: Stable visualized osseous structures. T10 congenital butterfly vertebra, normal variant. Degenerative appearing L1 superior endplate Schmorl's node is stable. No acute or destructive osseous lesion identified in the chest. Review of the MIP images confirms the above findings. IMPRESSION: 1.  Advanced Thoracic Malignancy. Bulky mediastinal tumor invading and nearly occluding the right bronchus intermedius, bilateral hilar lymphadenopathy not significantly changed from PET-CT 3 days ago. 2. Generally stable pulmonary artery branch PE since the CTA on 05/17/2024. However, there is increased more occlusive appearing segmental pulmonary embolus in the lateral right middle lobe segmental branches. But most of the other pulmonary artery branch involvement is nonocclusive, and there is no lobar or saddle embolus. 3. Stable Emphysema (ICD10-J43.9). Stable bilateral lung ventilation with several lung nodules which were evaluated on recent PET. 4.  Aortic Atherosclerosis (ICD10-I70.0). Electronically Signed   By: VEAR Hurst M.D.   On: 05/24/2024 05:16   DG Chest Portable 1 View Result Date: 05/23/2024 CLINICAL DATA:  Syncope Pt reports that she feels like she is going to pass out x 2 days with worsening today. Pt has Stage 3 lung cancer and recently diagnosed with a PE. Pt reports chest pain and SOB, as well. EXAM: PORTABLE CHEST 1 VIEW COMPARISON:  Chest x-ray 05/20/2024, CT chest 05/17/2024 FINDINGS: The heart and mediastinal contours are unchanged with superior mediastinal irregular contour best  evaluated on CT angio chest 07/17/2024. Chronic coarsened interstitial markings with no overt pulmonary edema. No focal consolidation. No pleural effusion. No pneumothorax. No acute osseous abnormality. IMPRESSION: 1. No acute cardiopulmonary disease. 2. Superior mediastinal irregular contour best evaluated on CT angio chest 07/17/2024. Electronically Signed   By: Morgane  Naveau M.D.   On: 05/23/2024 22:11   NM PET Image Initial (PI) Skull Base To Thigh (F-18 FDG) Result Date: 05/21/2024 CLINICAL DATA:  Initial treatment strategy for non-small-cell carcinoma. EXAM: NUCLEAR MEDICINE PET SKULL BASE TO THIGH TECHNIQUE: 5.61 mCi F-18 FDG was injected intravenously. Full-ring PET imaging was performed from the skull base to thigh after the radiotracer. CT data was obtained and used for attenuation correction and anatomic localization. Fasting blood glucose: 146 mg/dl COMPARISON:  CTA chest 05/17/2024 and older FINDINGS: Mediastinal blood pool activity: SUV max 2.0 Liver activity: SUV max 2.2 NECK: Abnormal lymph nodes are identified in the low right neck, supraclavicular region. At least 3 nodes are seen. Largest is seen with maximum SUV value of 7.6 and dimension on image 36 measuring 19 by 17 mm. No other areas of abnormal nodal uptake in the left side of the neck. There is asymmetric uptake along the right vocal cord compared to left. Please correlate for any focal cord abnormality. Incidental CT findings: Small thyroid gland. The submandibular glands and parotid glands are grossly preserved. Please see separate taken of MRI of the brain from 05/17/2024. Paranasal sinuses and mastoid air cells are clear. Is some streak artifact from the dental hardware. Scattered vascular calcifications. CHEST: Extensive hypermetabolic lymph nodes identified throughout the mediastinum. Distribution includes internal mammary chain on the right, prevascular, paratracheal, subcarinal, left thoracic inlet and chest wall and  paraesophageal. Additional abnormal nodes seen in both hila. Specific examples include the large confluent mass upper right mediastinum maximum SUV of 9.1. This area in total on image 47 measures 5.1 x 4.8 cm. These and other nodes displace mediastinal structures and there is slight narrowing of the SVC along its course. Confluent area along left side of the upper mediastinum the level of the arch has maximum SUV of 7.7 and again is likely confluence of multiple nodes with the entirety measured on image 55 at 6.6 by 2.7 cm. The right hilar node has maximum SUV approaching 5.3 on image 65. Left hilar area on image 65 has maximum SUV of 6.0.  There is a small nodule in the right lower lobe which is centrally cavitary on image 69 measuring 12 by 8 mm. This has only minimal uptake of maximum SUV of 1.2. No specific areas of abnormal lung uptake at this time. Please correlate history. Incidental CT findings: Advanced centrilobular emphysematous lung changes are identified. No pneumothorax or effusion. There is some linear opacity the bases likely scar or atelectasis. There is motion. Significant areas of debris or filling defects along the bilateral bronchi centrally, right-greater-than-left, bronchus intermedius. Heart is nonenlarged. Small pericardial effusion. Scattered vascular calcifications are seen including the coronary arteries. Slightly patulous esophagus. Please correlate with the recent CT scan of the chest from 05/17/2024. Once again arterial stents identified along the great vessels, brachiocephalic artery with extension into the common and right subclavian artery. ABDOMEN/PELVIS: Physiologic distribution radiotracer along the parenchymal organs, bowel and renal collecting systems. No specific abnormal nodal uptake in the abdomen and pelvis as well. Incidental CT findings: Hyperdense left-sided lower pole renal cystic foci again seen. Please correlate with previous CT and MRI evaluations. Diffuse vascular  calcifications. Some high attenuation material along the large bowel is again seen. The large bowel is nondilated with scattered stool. The stomach and small bowel are nondilated. Spleen, adrenal glands and liver are grossly preserved. Gallbladder is nondilated. SKELETON: There is a focus of increased uptake with maximum SUV of 3.7 on image 127. This is along the left iliac bone. Faint area of sclerosis identified in this location image 126 of the CT. An isolated bony metastatic focus is possible. Please correlate for symptoms and either short follow-up versus additional workup. No other areas of abnormal uptake along the skeleton Incidental CT findings: Spine.  Scattered degenerative changes. IMPRESSION: Extensive hypermetabolic nodal enlargement throughout the mediastinum, hilum and also thoracic inlet, right supraclavicular and left chest wall. Small right-sided lower lobe lung nodule identified with no abnormal uptake. Please correlate with history. Attention on follow-up. Filling defects along the central bronchi of the lungs particularly the bronchus intermedius. Please correlate with history. Small focus of increased uptake in the left iliac bone relative to other areas of marrow. A subtle isolated bony metastasis is in the differential. Further workup for tension on follow-up as clinically directed. Asymmetric vocal cord uptake.  Please correlate for function. Please correlate with multiple prior CT and MRI examinations including brain MRI. Electronically Signed   By: Ranell Bring M.D.   On: 05/21/2024 16:53   DG Chest Port 1 View Result Date: 05/20/2024 CLINICAL DATA:  sob EXAM: PORTABLE CHEST - 1 VIEW COMPARISON:  May 17, 2024 FINDINGS: Emphysema. No focal airspace consolidation, pleural effusion, or pneumothorax. No cardiomegaly. Unchanged thickening of the right paratracheal stripe and AP window. No acute fracture or destructive lesions. Multilevel thoracic osteophytosis. IMPRESSION: 1. Emphysema.  No  acute cardiopulmonary abnormality. 2. Unchanged masslike thickening within the right paratracheal and AP window regions, correlating to the lymphadenopathy or confluent soft tissue noted on the prior CT. Electronically Signed   By: Rogelia Myers M.D.   On: 05/20/2024 17:11   MR BRAIN W WO CONTRAST Result Date: 05/17/2024 CLINICAL DATA:  Initial evaluation for metastatic disease. EXAM: MRI HEAD WITHOUT AND WITH CONTRAST TECHNIQUE: Multiplanar, multiecho pulse sequences of the brain and surrounding structures were obtained without and with intravenous contrast. CONTRAST:  5mL GADAVIST  GADOBUTROL  1 MMOL/ML IV SOLN COMPARISON:  None Available. FINDINGS: Brain: Mild age-related cerebral atrophy. Patchy T2/FLAIR hyperintensity involving the periventricular deep white matter both cerebral hemispheres, most like related chronic  microvascular ischemic disease, moderately advanced in nature. No evidence for acute or subacute ischemia. No areas of chronic cortical infarction. No acute intracranial hemorrhage. 1.2 cm rim enhancing lesion positioned along the gray-white matter differentiation of the left temporal occipital junction (series 17, image 57). Mild localized vasogenic edema without significant regional mass effect. Associated susceptibility artifact consistent with blood products and/or necrosis. Finding suspicious for a small metastatic lesion given provided history. There is question of an additional 5 mm focus of enhancement at the superior right cerebellum (series 18, image 8). Associated susceptibility artifact at this location (series 10, image 12). Finding is nonspecific, and could reflect a small additional metastatic lesion or possibly small vascular lesion. No other visible intracranial metastases. No other pathologic enhancement. Ventricles normal size without hydrocephalus. No extra-axial fluid collection. Pituitary gland and suprasellar region within normal limits. Vascular: Major intracranial  vascular flow voids are maintained. Skull and upper cervical spine: Craniocervical junction within normal limits. Bone marrow signal intensity normal. No scalp soft tissue abnormality. Sinuses/Orbits: Globes and orbital soft tissues within normal limits. Paranasal sinuses are largely clear. Trace left mastoid effusion noted, of doubtful significance. Other: None. IMPRESSION: 1. 1.2 cm rim enhancing lesion positioned at the left temporoccipital junction, suspicious for a metastatic lesion given provided history. Mild localized edema without significant regional mass effect. 2. Question additional 5 mm focus of enhancement at the superior right cerebellum, nonspecific, and could reflect an additional metastasis or possibly a small vascular lesion. Attention at follow-up recommended. 3. No other acute intracranial abnormality. 4. Underlying age-related cerebral atrophy with moderately advanced chronic microvascular ischemic disease. Electronically Signed   By: Morene Hoard M.D.   On: 05/17/2024 21:08   DG Chest Portable 1 View Result Date: 05/17/2024 CLINICAL DATA:  Chest pain. EXAM: PORTABLE CHEST 1 VIEW COMPARISON:  Radiographs 04/26/2024 and 12/14/2021. Chest CT 04/21/2024. FINDINGS: 1509 hours. Previously demonstrated right paratracheal adenopathy may be slightly progressive. The heart size is stable. There are right subclavian and right common carotid vascular stents. Pulmonary nodularity seen by CT is not well visualized. The lungs appear clear. No evidence of pleural effusion or pneumothorax. The bones appear unchanged. IMPRESSION: 1. No evidence of acute cardiopulmonary process. 2. Possible slight progression of known right paratracheal adenopathy, remaining suspicious for malignancy. Electronically Signed   By: Elsie Perone M.D.   On: 05/17/2024 16:46   CT ANGIO CHEST PE W OR WO CONTRAST Result Date: 05/17/2024 CLINICAL DATA:  Pulmonary embolism (PE) suspected, high prob. EXAM: CT ANGIOGRAPHY  CHEST WITH CONTRAST TECHNIQUE: Multidetector CT imaging of the chest was performed using the standard protocol during bolus administration of intravenous contrast. Multiplanar CT image reconstructions and MIPs were obtained to evaluate the vascular anatomy. RADIATION DOSE REDUCTION: This exam was performed according to the departmental dose-optimization program which includes automated exposure control, adjustment of the mA and/or kV according to patient size and/or use of iterative reconstruction technique. CONTRAST:  75mL OMNIPAQUE  IOHEXOL  350 MG/ML SOLN COMPARISON:  CT scan chest from 04/21/2024. FINDINGS: Cardiovascular: There is small-to-moderate volume predominantly nonocclusive embolism involving the segmental and proximal subsegmental pulmonary artery branches throughout bilateral lungs (marked with electronic arrow sign on series 4). There is no right heart strain or lung infarction. Normal cardiac size. Physiological pericardial effusion. No aortic aneurysm. There are coronary artery calcifications, in keeping with coronary artery disease. There are also mild peripheral atherosclerotic vascular calcifications of thoracic aorta and its major branches. Mediastinum/Nodes: Visualized thyroid gland appears grossly unremarkable. Redemonstration of extensive lobulated mediastinal soft  tissue mass encircling lower trachea, bilateral main bronchi, pulmonary artery and its tributaries an lower portion of superior vena cava. The mass also abuts ascending aorta, aortic arch and descending thoracic aorta. The fat planes between the mass and the thoracic esophagus are also lost. The esophagus is nondistended precluding optimal assessment. There is mild circumferential thickening of the mid/lower thoracic esophagus, which is most likely seen in the settings of chronic gastroesophageal reflux disease versus esophagitis. No axillary lymphadenopathy by size criteria. Lungs/Pleura: The trachea and left bronchial tree are  patent. Redemonstration of an approximately 8 x 17 mm soft tissue nodular thickening along the left wall of the bronchus intermedius, which is similar to the prior study. There are extensive frothy soft tissue in the bronchus intermedius and right lower lobe bronchial tree, favoring mucus/secretions versus aspiration. There are mild-to-moderate upper lobe predominant centrilobular emphysematous changes. There are patchy areas of linear, plate-like atelectasis and/or scarring throughout bilateral lungs. Redemonstration of several in regular solid noncalcified nodules in bilateral lungs, described as follows: *Linear configuration 4 x 19 mm nodule in the left lung upper lobe (series 12, image 34), unchanged since the prior study. *There is a slightly spiculated 7 x 12 mm nodule in the right lung lower lobe (series 12, image 91), unchanged since the prior study. *There are additional stable subcentimeter sized nodularity in the right upper lobe (series 12, images 62 and 78), unchanged since the prior study. No new or suspicious lung nodule. No new mass or consolidation. No pleural effusion or pneumothorax. Upper Abdomen: There is infiltrating soft tissue surrounding aorta in the visualized portion of upper abdomen which is also grossly similar to the prior study. Remaining visualized upper abdominal viscera within normal limits. Musculoskeletal: The visualized soft tissues of the chest wall are grossly unremarkable. No suspicious osseous lesions. There are mild multilevel degenerative changes in the visualized spine. L1 superior endplate Schmorl's node again seen. Review of the MIP images confirms the above findings. IMPRESSION: 1. There is small-to-moderate volume predominantly nonocclusive embolism involving the segmental and proximal subsegmental pulmonary artery branches throughout bilateral lungs. No right heart strain or lung infarction. 2. Redemonstration of extensive mediastinal soft tissue as well as soft  tissue surrounding aorta in the visualized portion of upper abdomen which are grossly similar to the prior study. Findings favor lymphoma versus other metastatic disease. 3. There are extensive frothy soft tissue in the bronchus intermedius and right lower lobe bronchial tree, favoring mucus/secretions versus aspiration. Stable soft tissue nodular thickening along the left wall of the bronchus intermedius. 4. Stable pre-existing lung nodules, as described above. 5. Multiple other nonacute observations, as described above. Aortic Atherosclerosis (ICD10-I70.0) and Emphysema (ICD10-J43.9). Critical Value/emergent results were called by telephone at the time of interpretation on 05/17/2024 at 4:34 pm to provider Dr. Rocky Massy, who verbally acknowledged these results. Electronically Signed   By: Ree Molt M.D.   On: 05/17/2024 16:36       IMPRESSION/PLAN: 1.  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  I spoke with the patient and her significant other about the findings, and the rationale for moving to the [facility] from [facility] in order to consider a palliative course of radiation.  Pulmonary has waited and does not feel that she would benefit from any thoracentesis at this time.  We appreciate the primary  team as they manage her other medical issues including her PE.  I will discuss her case with Dr. Dewey but anticipate that he would offer her 10 fractions of palliative radiation to the right chest with goals of trying to open up her airway, she is aware that it will take multiple treatments to attempt this, and we discussed the consideration of stimulation and into possibly begin treatment today as well.  We reviewed risks, benefits, short and long-term effects of radiotherapy and the patient is interested in  proceeding.  She will come down to our department today to sign written consent for simulation and if Dr. Dewey is in agreement proceed with treatment this evening.  Addendum: After discussing his case with Dr. Dewey, he recommends 10 fractions to the right lung/mediastinum. I reviewed this with her and her sister at the bedside in our department as well. Written consent is obtained and placed in the chart, a copy was provided to the patient. She will simulate this morning and begin treatment this evening.  In a visit lasting 60 minutes, greater than 50% of the time was spent face to face and and in floor time discussing the patient's condition, in preparation for the discussion, and coordinating the patient's care.      Donald KYM Husband, [facility]   **Disclaimer: This note was dictated with voice recognition software. Similar sounding words can inadvertently be transcribed and this note may contain transcription errors which may not have been corrected upon publication of note.**

## 2024-06-16 ENCOUNTER — Other Ambulatory Visit

## 2024-06-16 ENCOUNTER — Other Ambulatory Visit: Payer: Self-pay

## 2024-06-16 ENCOUNTER — Ambulatory Visit
Admission: RE | Admit: 2024-06-16 | Discharge: 2024-06-16 | Discharge status: Home / Self Care | Source: Ambulatory Visit | Attending: Radiation Oncology

## 2024-06-16 ENCOUNTER — Ambulatory Visit: Admitting: Internal Medicine

## 2024-06-16 ENCOUNTER — Inpatient Hospital Stay

## 2024-06-16 DIAGNOSIS — Z87891 Personal history of nicotine dependence: Secondary | ICD-10-CM | POA: Diagnosis not present

## 2024-06-16 DIAGNOSIS — J9621 Acute and chronic respiratory failure with hypoxia: Secondary | ICD-10-CM | POA: Diagnosis not present

## 2024-06-16 DIAGNOSIS — C3431 Malignant neoplasm of lower lobe, right bronchus or lung: Secondary | ICD-10-CM | POA: Diagnosis not present

## 2024-06-16 DIAGNOSIS — Z510 Encounter for antineoplastic radiation therapy: Secondary | ICD-10-CM | POA: Diagnosis not present

## 2024-06-16 LAB — CBC WITH DIFFERENTIAL/PLATELET
Abs Immature Granulocytes: 0.02 K/uL (ref 0.00–0.07)
Basophils Absolute: 0 K/uL (ref 0.0–0.1)
Basophils Relative: 1 %
Eosinophils Absolute: 0.1 K/uL (ref 0.0–0.5)
Eosinophils Relative: 1 %
HCT: 29.7 % — ABNORMAL LOW (ref 36.0–46.0)
Hemoglobin: 9 g/dL — ABNORMAL LOW (ref 12.0–15.0)
Immature Granulocytes: 0 %
Lymphocytes Relative: 10 %
Lymphs Abs: 0.5 K/uL — ABNORMAL LOW (ref 0.7–4.0)
MCH: 25.3 pg — ABNORMAL LOW (ref 26.0–34.0)
MCHC: 30.3 g/dL (ref 30.0–36.0)
MCV: 83.4 fL (ref 80.0–100.0)
Monocytes Absolute: 0.5 K/uL (ref 0.1–1.0)
Monocytes Relative: 9 %
Neutro Abs: 4.2 K/uL (ref 1.7–7.7)
Neutrophils Relative %: 79 %
Platelets: 309 K/uL (ref 150–400)
RBC: 3.56 MIL/uL — ABNORMAL LOW (ref 3.87–5.11)
RDW: 16.6 % — ABNORMAL HIGH (ref 11.5–15.5)
WBC: 5.3 K/uL (ref 4.0–10.5)
nRBC: 0 % (ref 0.0–0.2)

## 2024-06-16 LAB — BASIC METABOLIC PANEL WITH GFR
Anion gap: 11 (ref 5–15)
BUN: 19 mg/dL (ref 8–23)
CO2: 22 mmol/L (ref 22–32)
Calcium: 9.1 mg/dL (ref 8.9–10.3)
Chloride: 101 mmol/L (ref 98–111)
Creatinine, Ser: 0.61 mg/dL (ref 0.44–1.00)
GFR, Estimated: >60 mL/min (ref >60)
Glucose, Bld: 92 mg/dL (ref 70–99)
Potassium: 3.5 mmol/L (ref 3.5–5.1)
Sodium: 134 mmol/L — ABNORMAL LOW (ref 135–145)

## 2024-06-16 LAB — RAD ONC ARIA SESSION SUMMARY
Course Elapsed Days: 1
Plan Fractions Treated to Date: 2
Plan Prescribed Dose Per Fraction: 3 Gy
Plan Total Fractions Prescribed: 10
Plan Total Prescribed Dose: 30 Gy
Reference Point Dosage Given to Date: 6 Gy
Reference Point Session Dosage Given: 3 Gy
Session Number: 2

## 2024-06-16 NOTE — Progress Notes (Signed)
 Daily Progress Note   Patient Name: Kristina Howe       Date: 06/16/2024 DOB: Aug 27, 1961  Age: 63 y.o. MRN#: [id number] Attending Physician: Vernon Ranks, MD Primary Care Physician: [name] BROCKS, FNP Admit Date: 06/14/2024  Reason for Consultation/Follow-up: Establishing goals of care  Subjective: Awake alert resting in bed with significant other Chyrl as well as sister present at bedside   Length of Stay: 1  Current Medications: Scheduled Meds:   atorvastatin   40 mg Oral Daily   enoxaparin   50 mg Subcutaneous Q12H   feeding supplement  237 mL Oral BID BM   FLUoxetine   20 mg Oral Daily   guaiFENesin   600 mg Oral BID   irbesartan   300 mg Oral Daily   sodium chloride  flush  3 mL Intravenous Q12H   sodium chloride  HYPERTONIC  4 mL Nebulization Daily    Continuous Infusions:   PRN Meds: acetaminophen  **OR** acetaminophen , ALPRAZolam , ipratropium-albuterol , oxyCODONE , polyethylene glycol  Physical Exam         No distress Regular work of breathing Appears thinly built  Vital Signs: BP (!) 144/85 (BP Location: Right Arm)   Pulse 88   Temp 98.6 F (37 C) (Oral)   Resp 18   Ht 5' 7 (1.702 m)   Wt 42.6 kg   SpO2 96%   BMI 14.72 kg/m  SpO2: SpO2: 96 % O2 Device: O2 Device: Nasal Cannula O2 Flow Rate: O2 Flow Rate (L/min): 3 L/min  Intake/output summary:  Intake/Output Summary (Last 24 hours) at 06/16/2024 1102 Last data filed at 06/15/2024 2300 Gross per 24 hour  Intake 240 ml  Output --  Net 240 ml   LBM: Last BM Date : 06/14/24 Baseline Weight: Weight: 42.6 kg Most recent weight: Weight: 42.6 kg       Palliative Assessment/Data:      Patient Active Problem List   Diagnosis Date Noted   Acute on chronic respiratory failure (HCC) 06/15/2024   Anemia  06/14/2024   Acute on chronic respiratory failure with hypoxia (HCC) 06/14/2024   Chronic respiratory failure with hypoxia (HCC) 06/01/2024   Hypoxemia 05/17/2024   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) 05/11/2024   Mediastinal adenopathy 04/16/2024   Pulmonary nodules 04/16/2024   Pulmonary embolism (HCC) 04/16/2024   Buerger's disease (HCC)  04/16/2024   Tobacco use 04/16/2024   Anxiety 04/09/2024   Ischemic finger 03/07/2024    Palliative Care Assessment & Plan   Patient Profile:    Assessment: 63 year old lady stage 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  Patient known to palliative service.  Patient has already been set up with my colleague Ms. Cousar, NP at the [facility].  Patient and her family present at bedside wish to continue with full code full scope status.  They state that the patient does not have any unmanaged symptoms.  They wish to continue current plan of care.  At this time, palliative care will follow peripherally.  Recommend to follow-up at cancer Center palliative care.    Code Status:    Code Status Orders  (From admission, onward)           Start     Ordered   06/15/24 1714  Full code  (Code Status)  Continuous       Question:  By:  Answer:  Consent: discussion documented in EHR   06/15/24 1713           Code Status History     Date Active Date Inactive Code Status Order ID Comments User Context   06/14/2024 1412  06/15/2024 1713 Do not attempt resuscitation (DNR) PRE-ARREST INTERVENTIONS DESIRED [id number]  Seena Marsa NOVAK, MD ED   06/14/2024 1329 06/14/2024 1412 Limited: Do not attempt resuscitation (DNR) -DNR-LIMITED -Do Not Intubate/DNI  [id number]  Seena Marsa NOVAK, MD ED   06/01/2024 1812 06/04/2024 2306 Limited: Do not attempt resuscitation (DNR) -DNR-LIMITED -Do Not Intubate/DNI  [id number]  Davia Nydia POUR, MD ED   05/17/2024 1743 05/18/2024 1737 Full Code [id number]  Raenelle Coria, MD ED   03/07/2024 2007 03/12/2024 1445 Full Code [id number]  Keturah Carrier, MD Inpatient       Prognosis:  Unable to determine  Discharge Planning: To Be Determined  Care plan was discussed with patient, significant other Chyrl as well as sister present at bedside.  Thank you for allowing the Palliative Medicine Team to assist in the care of this patient. Mod MDM.      Greater than 50%  of this time was spent counseling and coordinating care related to the above assessment and plan.  Lonia Serve, MD  Please contact Palliative Medicine Team phone at [phone] for questions and concerns.

## 2024-06-16 NOTE — Progress Notes (Signed)
 Occupational Therapy Treatment Note  Pt educated on general HEP with use of T band. Family present and discussed importance of performing her HEP daily to help keep her strength during treatments. Written information provided on energy conservation as well. Sister present for session and verbalized understanding.No further OT needed. VSS on 2L    06/16/24 1500  OT Visit Information  Last OT Received On 06/16/24  Assistance Needed +1  History of Present Illness 63 yo female presented with worsening shortness of breath. 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  Precautions  Precautions None  Pain Assessment  Pain Assessment No/denies pain  Cognition  Arousal Alert  Behavior During Therapy WFL for tasks assessed/performed  Cognition No apparent impairments (most likely close to baseline; slow processing)  Exercises  Exercises General Upper Extremity  General Exercises - Upper Extremity  Shoulder Flexion Strengthening;Both;10 reps;Theraband  Theraband Level (Shoulder Flexion) Level 1 (Yellow)  Shoulder Extension Strengthening;Both;10 reps;Theraband  Theraband Level (Shoulder Extension) Level 2 (Red)  Shoulder ABduction Strengthening;Both;10 reps;Theraband  Theraband Level (Shoulder Abduction) Level 1 (Yellow)  Elbow Flexion Strengthening;Both;10 reps;Theraband  Theraband Level (Elbow Flexion) Level 1 (Yellow)  Elbow Extension Strengthening;Both;10 reps;Theraband  Theraband Level (Elbow Extension) Level 1 (Yellow)  Other Exercises  Other Exercises reviewed written HEP for BUE T band ex adn bed /chair level BLE general strengthening ex  OT - End of Session  Equipment Utilized  During Treatment Oxygen (2L)  Activity Tolerance Patient tolerated treatment well  Patient left in bed;with call bell/phone within reach;with family/visitor present  Nurse Communication Other (comment) (DC needs)  OT Assessment/Plan  OT Visit Diagnosis Muscle weakness (generalized) (M62.81)  Follow Up Recommendations No OT follow up  Patient can return home with the following Assistance with cooking/housework;Direct supervision/assist for medications management;Direct supervision/assist for financial management;Assist for transportation  OT Equipment Tub/shower seat  AM-PAC OT 6 Clicks Daily Activity Outcome Measure (Version 2)  Help from another person eating meals? 4  Help from another person taking care of personal grooming? 4  Help from another person toileting, which includes using toliet, bedpan, or urinal? 4  Help from another person bathing (including washing, rinsing, drying)? 4  Help from another person to put on and taking off regular upper body clothing? 4  Help from another person to put on and taking off regular lower body clothing? 4  6 Click Score 24  Progressive Mobility  What is the highest level of mobility based on the progressive mobility assessment? Level 6 (Walks independently in room and hall) - Balance while walking in room without assist - Complete  OT Goal Progression  Progress towards OT goals Goals met/education completed, patient discharged from OT  Acute Rehab OT Goals  Patient Stated Goal home today  OT Goal Formulation With patient/family  OT Time Calculation  OT Start Time (ACUTE ONLY) 1505  OT Stop Time (ACUTE ONLY) 1520  OT Time Calculation (min) 15 min  OT General Charges  $OT Visit 1 Visit  OT Treatments  $Therapeutic Exercise 8-22 mins   Kreg Sink, OT/L   Acute OT Clinical Specialist Acute Rehabilitation Services Pager [phone] Office [phone]

## 2024-06-16 NOTE — Discharge Summary (Signed)
 Physician Discharge Summary  Kristina Howe FMW:[id number] DOB: 1961/07/06 DOA: 06/14/2024  PCP: [name] BROCKS, FNP  Admit date: 06/14/2024 Discharge date: 06/16/2024 30 Day Unplanned Readmission Risk Score    Flowsheet Row ED to Hosp-Admission (Current) from 06/14/2024 in [facility]LO[facility]  30 Day Unplanned Readmission Risk Score (%) 22.09 Filed at 06/16/2024 0801    This score is the patient's risk of an unplanned readmission within 30 days of being discharged (0 -100%). The score is based on dignosis, age, lab data, medications, orders, and past utilization.   Low:  0-14.9   Medium: 15-21.9   High: 22-29.9   Extreme: 30 and above          Admitted From: Home Disposition: Home  Recommendations for Outpatient Follow-up:  Follow up with PCP in 1-2 weeks Please obtain BMP/CBC in one week Follow-up with oncology. Please follow up with your PCP on the following pending results: Unresulted Labs (From admission, onward)    None         Home Health: None Equipment/Devices: Home oxygen-patient already has that  Discharge Condition: Stable CODE STATUS: Full code Diet recommendation: Cardiac  Subjective: Seen and examined.  No complaints today.  Family members and boyfriend at the bedside.  Patient excited to go home.  Brief/Interim Summary:  Kristina Howe is a 63 y.o. 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   Patient admitted 6/9-6/10 with PE started on anticoagulation.  Readmitted 6/24-6/27 with acute respiratory failure likely secondary to pneumonia in the setting of recent PE and lung cancer.  Improved with antibiotics and discharged on 2 L supplemental oxygen.   Has had some gradual shortness of breath since that time  but it abruptly increased for the past day.  Noted to have increased home oxygen from 2 to 4 L maintain saturations.  Also reportedly had increased cough and sputum production.  Also reportedly had some increased confusion after starting the radiation therapy for her brain metastases.  No fever, chills or abdominal pain.  Upon arrival to ED, she was hemodynamically stable. CT head showed area that corresponds to previous mets on MRI but no acute dramality.  Chest x-ray showed stable changes from 6/24 with right middle lobe and right lower lobe atelectasis with bulky adenopathy.  CTA PE study showed small nonocclusive right upper lobe PE similar to previous but no new PE.  Also noted was complete occlusion of bronchus intermedius and the right middle lobe and right lower lobe bronchi with collapse of the right middle lobe and right lower lobe.   Patient received droperidol , Ativan , Solu-Medrol , albuterol  x 2, Atrovent .  Pulmonology/critical care was consulted for possible bronchoscopy but on their evaluation CT shows that bronchus occlusion is not secondary to mucous plugging but is instead due to mass effect from lung cancer.  Recommended oncology and radiation.  Patient was transferred to [facility] for that.  Admitted under hospitalist service for the following.  Acute on chronic hypoxic respiratory failure due to Lung cancer with mets to brain and bone, Recent pulmonary embolism and emphysema.  Case was discussed with oncology, they discussed with radiation oncology, radiation oncology planned 10 days of palliative radiation which was started on 06/15/2024.  They have cleared the patient for discharge if she is stable.  Patient has  been weaned back to 2 L of oxygen which is her baseline.  She denies any shortness of breath and prefers to go home.  She will be discharged later today after second session of palliative radiation today.  Did not appear to have any signs of infection or pneumonia and thus  she was not given any antibiotics.  Oxygen now.   Acute metabolic encephalopathy, POA > Has been having some confusion after starting radiotherapy for brain metastases.  Likely related to this treatment.  No acute abnormality on CT head.  Patient is fully alert and oriented.  Ammonia normal.   Hypertension: Controlled.  Continue irbesartan .   Hyperlipidemia - Continue statin   Anemia Hemoglobin stable.  Monitor daily.   Anxiety - Continue home Xanax  and fluoxetine    History of subclavian stenosis - Status post stenting    Discharge plan was discussed with patient and/or family member and they verbalized understanding and agreed with it.  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 What changed: when to take this   ALPRAZolam  1 MG tablet Commonly known as: XANAX  Take 1 tablet (1 mg total) by mouth 3 (three) times daily as needed for anxiety. What changed: when to take this   atorvastatin  40 MG tablet Commonly known as: LIPITOR Take 1 tablet (40 mg total) by mouth daily.   enoxaparin  60 MG/0.6ML injection Commonly known as: LOVENOX  Inject 0.5 mLs (50 mg total) into the skin every 12 (twelve) hours.   ferrous sulfate  325 (65 FE) MG tablet Take 325 mg by mouth daily with breakfast.   FLUoxetine  20 MG capsule Commonly known as: PROZAC  Take 20 mg by mouth daily.   multivitamin with minerals tablet Take 1 tablet by mouth daily with  breakfast.   oxyCODONE  5 MG immediate release tablet Commonly known as: Oxy IR/ROXICODONE  Take 1 tablet (5 mg total) by mouth every 6 (six) hours as needed for severe pain (pain score 7-10).   triamterene -hydrochlorothiazide  37.5-25 MG tablet Commonly known as: MAXZIDE -25 Take 1 tablet by mouth daily.   valsartan 320 MG tablet Commonly known as: DIOVAN Take 320 mg by mouth daily.        Follow-up Information     [name] BROCKS, FNP Follow up in 1 week(s).   Specialty: Nurse Practitioner Contact information: [address] [place] [address] [phone]                No Known Allergies  Consultations: Pulmonology, oncology, radiation oncology   Procedures/Studies: CT Angio Chest PE W and/or Wo Contrast Result Date: 06/14/2024 CLINICAL DATA:  Concern for pulmonary embolism.  Lung cancer. EXAM: CT ANGIOGRAPHY CHEST WITH CONTRAST TECHNIQUE: Multidetector CT imaging of the chest was performed using the standard protocol during bolus administration of intravenous contrast. Multiplanar CT image reconstructions and MIPs were obtained to evaluate the vascular anatomy. RADIATION DOSE REDUCTION: This exam was performed according  to the departmental dose-optimization program which includes automated exposure control, adjustment of the mA and/or kV according to patient size and/or use of iterative reconstruction technique. CONTRAST:  75mL OMNIPAQUE  IOHEXOL  350 MG/ML SOLN COMPARISON:  CT dated 05/24/2024. FINDINGS: Cardiovascular: There is no cardiomegaly. Interval increase in the size of the pericardial effusion measuring approximately 1 cm in thickness anterior to the heart. There is mild atherosclerotic calcification of the thoracic aorta. No aneurysmal dilatation or dissection. Stents in the proximal right common carotid and subclavian arteries noted. The origins of the great vessels of the aortic arch are patent. Small nonocclusive right upper lobe segmental pulmonary artery  embolus similar to prior CT. No new pulmonary embolus. No evidence of right heart straining. Mediastinum/Nodes: Bulky hilar and mediastinal adenopathy progressed since the prior CT. Bilateral supraclavicular adenopathy noted. There is mass effect and compression of the midportion of the esophagus by the mediastinal adenopathy. No mediastinal fluid collection. Lungs/Pleura: There is complete occlusion of the bronchus intermedius and right middle and right lower lobe bronchi with complete collapse of the right middle and right lower lobes. This is likely related to tumor invasion. There is a small right pleural effusion. There is background of emphysema. Linear scarring in the left apex. No pneumothorax. Upper Abdomen: No acute abnormality. Musculoskeletal: Osteopenia.  No acute osseous pathology. Review of the MIP images confirms the above findings. IMPRESSION: 1. Small nonocclusive right upper lobe segmental pulmonary artery embolus similar to prior CT. No new pulmonary embolus. No evidence of right heart straining. 2. Complete occlusion of the bronchus intermedius and right middle and right lower lobe bronchi with complete collapse of the right middle and right lower lobes. 3. Small right pleural effusion. 4. Bulky hilar and mediastinal adenopathy progressed since the prior CT. 5. Aortic Atherosclerosis (ICD10-I70.0) and Emphysema (ICD10-J43.9). Electronically Signed   By: Vanetta Chou M.D.   On: 06/14/2024 11:55   CT Head Wo Contrast Result Date: 06/14/2024 CLINICAL DATA:  Mental status change, unknown cause EXAM: CT HEAD WITHOUT CONTRAST TECHNIQUE: Contiguous axial images were obtained from the base of the skull through the vertex without intravenous contrast. RADIATION DOSE REDUCTION: This exam was performed according to the departmental dose-optimization program which includes automated exposure control, adjustment of the mA and/or kV according to patient size and/or use of iterative reconstruction  technique. COMPARISON:  MRI of the head dated May 26, 2024. FINDINGS: Brain: Age-related cerebral volume loss. Area of diminished attenuation within the left posterior temporal lobe corresponding with the ring-enhancing lesion noted on the previous MRI. There is also moderate subcortical and deep cerebral white matter disease. No discrete lesions are evident within the cerebellar hemispheres. Vascular: Negative. Skull: Unremarkable. Sinuses/Orbits: Normal. Other: None. IMPRESSION: 1. Focal area of diminished attenuation at the left temporal occipital junction corresponding with the likely metastatic lesion seen on previous MRI. The other lesions are not evident, but the current study is limited by the absence of intravenous contrast. 2. Moderate cerebral white matter disease. Electronically Signed   By: Evalene Coho M.D.   On: 06/14/2024 11:54   DG Chest Portable 1 View Result Date: 06/14/2024 CLINICAL DATA:  Shortness of breath, known stage IV lung cancer and COPD EXAM: PORTABLE CHEST 1 VIEW COMPARISON:  Prior chest x-ray 06/01/2024 FINDINGS: Unchanged contour of the mediastinum consistent with bulky mediastinal lymphadenopathy. Chronic bronchitic changes are similar compared to prior. Probable right middle and right lower lobe atelectasis is similar compared to prior. No new effusion or pneumothorax. No acute osseous abnormality. IMPRESSION:  Stable appearance of the chest without significant interval change compared to 06/01/2024. Right middle and right lower lobe atelectasis with bulky mediastinal adenopathy. Electronically Signed   By: Wilkie Lent M.D.   On: 06/14/2024 08:15   DG Chest Portable 1 View Result Date: 06/01/2024 CLINICAL DATA:  Shortness of breath EXAM: PORTABLE CHEST 1 VIEW COMPARISON:  05/23/2024 FINDINGS: Opacity in the right lower hemithorax could reflect a combination of airspace disease and effusion. Left lung clear. Heart is normal size. Again noted is bulky mediastinal  adenopathy as seen on prior CT. No acute bony abnormality. IMPRESSION: New opacification in the right lower chest may reflect a combination of effusion and airspace disease/collapse. Bulky mediastinal adenopathy. Electronically Signed   By: Franky Crease M.D.   On: 06/01/2024 16:17   MR Brain W Wo Contrast Result Date: 05/26/2024 CLINICAL DATA:  Metastatic disease evaluation 3T SRS Protocol for radiation treatment planning EXAM: MRI HEAD WITHOUT AND WITH CONTRAST TECHNIQUE: Multiplanar, multiecho pulse sequences of the brain and surrounding structures were obtained without and with intravenous contrast. CONTRAST:  5mL GADAVIST  GADOBUTROL  1 MMOL/ML IV SOLN COMPARISON:  MRI of the brain dated May 17, 2024. FINDINGS: Brain: An ovoid peripherally enhancing lesion is again demonstrated left posterior temporal lobe, measuring approximately 12 x 11 x 10 mm. There are additional nodular foci of enhancement present within the right cerebellar hemisphere, which are concerning for metastatic disease. The largest lesion measures approximately 3 mm in diameter and is seen on image 114 of series 1100. There are smaller focal areas of concerning enhancement present within the right cerebellum on images one hundred three, 105 and 108. There is moderate diffuse periventricular and deep cerebral white matter disease present. Vascular: Normal vascular flow voids. Skull and upper cervical spine: Normal signal intensity. Sinuses/Orbits: Negative. Other: None. IMPRESSION: 1. There are 4 areas of concerning enhancement present within the right cerebellar hemisphere, which are suspicious for metastatic disease. These are in addition to the highly suspicious lesion present within the left posterior temporal lobe. 2. Advanced cerebral white matter disease. Electronically Signed   By: Evalene Coho M.D.   On: 05/26/2024 16:07   CT Angio Chest Pulmonary Embolism (PE) W or WO Contrast Result Date: 05/24/2024 CLINICAL DATA:  63 year old  female with lung cancer. Pulmonary emboli, predominantly nonocclusive, affecting bilateral pulmonary arteries on CTA last week. Near syncope. * Tracking Code: BO * EXAM: CT ANGIOGRAPHY CHEST WITH CONTRAST TECHNIQUE: Multidetector CT imaging of the chest was performed using the standard protocol during bolus administration of intravenous contrast. Multiplanar CT image reconstructions and MIPs were obtained to evaluate the vascular anatomy. RADIATION DOSE REDUCTION: This exam was performed according to the departmental dose-optimization program which includes automated exposure control, adjustment of the mA and/or kV according to patient size and/or use of iterative reconstruction technique. CONTRAST:  75mL OMNIPAQUE  IOHEXOL  350 MG/ML SOLN COMPARISON:  CTA chest 05/17/2024, PET-CT 05/21/2024, and earlier. FINDINGS: Cardiovascular: Excellent contrast bolus timing in the pulmonary arterial tree. Mass effect on the central pulmonary arteries related to bulky mediastinal tumor. Stable to slightly smaller nonocclusive thrombi in left upper lobe branches seen on series 7, images 134, 146. No saddle or lobar embolus. Stable to smaller contralateral right lower lobe nonocclusive PE on series 7, image 175. Stable more occlusive appearing right lower lobe clot image 203. Stable nonocclusive thrombus in branches of the right upper lobe visible on images 120 and 124. But in lateral segment right middle lobe branches there is progressed segmental thrombus series 7,  images 179 and 188. Nearby medial segmental clot appears stable on image 189. Heart size remains normal. No pericardial effusion. Calcified aortic atherosclerosis. Proximal great vessels appear to remain patent, the proximal right CCA and subclavian arteries are stented as before. Mediastinum/Nodes: Bulky diffuse mediastinal lobulated tumor, bilateral mediastinal lymphadenopathy. Not significantly changed from the CTA last week or the recent PET-CT. Lungs/Pleura:  Mediastinal tumor nearly occludes the bronchus intermedius on series 6, image 60, unchanged from recent exams and distal right middle and lower lobe airway ventilation has mildly improved since 05/17/2024, with less retained secretions. No new airway stenosis. Background centrilobular emphysema in both lungs. Stable ventilation since 05/17/2024. No pneumothorax or pleural effusion. Right lower lobe peribronchial 11 mm lung nodule/mass redemonstrated on series 6, image 91. Linear left upper lobe probable lung scarring on image 24. Mild scarring versus nodularity in the lateral right upper lobe is stable on image 57. More curvilinear bilateral costophrenic angle scarring is stable. Upper Abdomen: Abdominal Calcified aortic atherosclerosis. Negative partially visible a centrally noncontrast liver, spleen, adrenal glands, left kidney, and bowel in the upper abdomen. Musculoskeletal: Stable visualized osseous structures. T10 congenital butterfly vertebra, normal variant. Degenerative appearing L1 superior endplate Schmorl's node is stable. No acute or destructive osseous lesion identified in the chest. Review of the MIP images confirms the above findings. IMPRESSION: 1. Advanced Thoracic Malignancy. Bulky mediastinal tumor invading and nearly occluding the right bronchus intermedius, bilateral hilar lymphadenopathy not significantly changed from PET-CT 3 days ago. 2. Generally stable pulmonary artery branch PE since the CTA on 05/17/2024. However, there is increased more occlusive appearing segmental pulmonary embolus in the lateral right middle lobe segmental branches. But most of the other pulmonary artery branch involvement is nonocclusive, and there is no lobar or saddle embolus. 3. Stable Emphysema (ICD10-J43.9). Stable bilateral lung ventilation with several lung nodules which were evaluated on recent PET. 4.  Aortic Atherosclerosis (ICD10-I70.0). Electronically Signed   By: VEAR Hurst M.D.   On: 05/24/2024 05:16    DG Chest Portable 1 View Result Date: 05/23/2024 CLINICAL DATA:  Syncope Pt reports that she feels like she is going to pass out x 2 days with worsening today. Pt has Stage 3 lung cancer and recently diagnosed with a PE. Pt reports chest pain and SOB, as well. EXAM: PORTABLE CHEST 1 VIEW COMPARISON:  Chest x-ray 05/20/2024, CT chest 05/17/2024 FINDINGS: The heart and mediastinal contours are unchanged with superior mediastinal irregular contour best evaluated on CT angio chest 07/17/2024. Chronic coarsened interstitial markings with no overt pulmonary edema. No focal consolidation. No pleural effusion. No pneumothorax. No acute osseous abnormality. IMPRESSION: 1. No acute cardiopulmonary disease. 2. Superior mediastinal irregular contour best evaluated on CT angio chest 07/17/2024. Electronically Signed   By: Morgane  Naveau M.D.   On: 05/23/2024 22:11   NM PET Image Initial (PI) Skull Base To Thigh (F-18 FDG) Result Date: 05/21/2024 CLINICAL DATA:  Initial treatment strategy for non-small-cell carcinoma. EXAM: NUCLEAR MEDICINE PET SKULL BASE TO THIGH TECHNIQUE: 5.61 mCi F-18 FDG was injected intravenously. Full-ring PET imaging was performed from the skull base to thigh after the radiotracer. CT data was obtained and used for attenuation correction and anatomic localization. Fasting blood glucose: 146 mg/dl COMPARISON:  CTA chest 05/17/2024 and older FINDINGS: Mediastinal blood pool activity: SUV max 2.0 Liver activity: SUV max 2.2 NECK: Abnormal lymph nodes are identified in the low right neck, supraclavicular region. At least 3 nodes are seen. Largest is seen with maximum SUV value of 7.6 and  dimension on image 36 measuring 19 by 17 mm. No other areas of abnormal nodal uptake in the left side of the neck. There is asymmetric uptake along the right vocal cord compared to left. Please correlate for any focal cord abnormality. Incidental CT findings: Small thyroid gland. The submandibular glands and parotid  glands are grossly preserved. Please see separate taken of MRI of the brain from 05/17/2024. Paranasal sinuses and mastoid air cells are clear. Is some streak artifact from the dental hardware. Scattered vascular calcifications. CHEST: Extensive hypermetabolic lymph nodes identified throughout the mediastinum. Distribution includes internal mammary chain on the right, prevascular, paratracheal, subcarinal, left thoracic inlet and chest wall and paraesophageal. Additional abnormal nodes seen in both hila. Specific examples include the large confluent mass upper right mediastinum maximum SUV of 9.1. This area in total on image 47 measures 5.1 x 4.8 cm. These and other nodes displace mediastinal structures and there is slight narrowing of the SVC along its course. Confluent area along left side of the upper mediastinum the level of the arch has maximum SUV of 7.7 and again is likely confluence of multiple nodes with the entirety measured on image 55 at 6.6 by 2.7 cm. The right hilar node has maximum SUV approaching 5.3 on image 65. Left hilar area on image 65 has maximum SUV of 6.0. There is a small nodule in the right lower lobe which is centrally cavitary on image 69 measuring 12 by 8 mm. This has only minimal uptake of maximum SUV of 1.2. No specific areas of abnormal lung uptake at this time. Please correlate history. Incidental CT findings: Advanced centrilobular emphysematous lung changes are identified. No pneumothorax or effusion. There is some linear opacity the bases likely scar or atelectasis. There is motion. Significant areas of debris or filling defects along the bilateral bronchi centrally, right-greater-than-left, bronchus intermedius. Heart is nonenlarged. Small pericardial effusion. Scattered vascular calcifications are seen including the coronary arteries. Slightly patulous esophagus. Please correlate with the recent CT scan of the chest from 05/17/2024. Once again arterial stents identified along  the great vessels, brachiocephalic artery with extension into the common and right subclavian artery. ABDOMEN/PELVIS: Physiologic distribution radiotracer along the parenchymal organs, bowel and renal collecting systems. No specific abnormal nodal uptake in the abdomen and pelvis as well. Incidental CT findings: Hyperdense left-sided lower pole renal cystic foci again seen. Please correlate with previous CT and MRI evaluations. Diffuse vascular calcifications. Some high attenuation material along the large bowel is again seen. The large bowel is nondilated with scattered stool. The stomach and small bowel are nondilated. Spleen, adrenal glands and liver are grossly preserved. Gallbladder is nondilated. SKELETON: There is a focus of increased uptake with maximum SUV of 3.7 on image 127. This is along the left iliac bone. Faint area of sclerosis identified in this location image 126 of the CT. An isolated bony metastatic focus is possible. Please correlate for symptoms and either short follow-up versus additional workup. No other areas of abnormal uptake along the skeleton Incidental CT findings: Spine.  Scattered degenerative changes. IMPRESSION: Extensive hypermetabolic nodal enlargement throughout the mediastinum, hilum and also thoracic inlet, right supraclavicular and left chest wall. Small right-sided lower lobe lung nodule identified with no abnormal uptake. Please correlate with history. Attention on follow-up. Filling defects along the central bronchi of the lungs particularly the bronchus intermedius. Please correlate with history. Small focus of increased uptake in the left iliac bone relative to other areas of marrow. A subtle isolated bony metastasis is  in the differential. Further workup for tension on follow-up as clinically directed. Asymmetric vocal cord uptake.  Please correlate for function. Please correlate with multiple prior CT and MRI examinations including brain MRI. Electronically Signed   By:  Ranell Bring M.D.   On: 05/21/2024 16:53   DG Chest Port 1 View Result Date: 05/20/2024 CLINICAL DATA:  sob EXAM: PORTABLE CHEST - 1 VIEW COMPARISON:  May 17, 2024 FINDINGS: Emphysema. No focal airspace consolidation, pleural effusion, or pneumothorax. No cardiomegaly. Unchanged thickening of the right paratracheal stripe and AP window. No acute fracture or destructive lesions. Multilevel thoracic osteophytosis. IMPRESSION: 1. Emphysema.  No acute cardiopulmonary abnormality. 2. Unchanged masslike thickening within the right paratracheal and AP window regions, correlating to the lymphadenopathy or confluent soft tissue noted on the prior CT. Electronically Signed   By: Rogelia Myers M.D.   On: 05/20/2024 17:11   MR BRAIN W WO CONTRAST Result Date: 05/17/2024 CLINICAL DATA:  Initial evaluation for metastatic disease. EXAM: MRI HEAD WITHOUT AND WITH CONTRAST TECHNIQUE: Multiplanar, multiecho pulse sequences of the brain and surrounding structures were obtained without and with intravenous contrast. CONTRAST:  5mL GADAVIST  GADOBUTROL  1 MMOL/ML IV SOLN COMPARISON:  None Available. FINDINGS: Brain: Mild age-related cerebral atrophy. Patchy T2/FLAIR hyperintensity involving the periventricular deep white matter both cerebral hemispheres, most like related chronic microvascular ischemic disease, moderately advanced in nature. No evidence for acute or subacute ischemia. No areas of chronic cortical infarction. No acute intracranial hemorrhage. 1.2 cm rim enhancing lesion positioned along the gray-white matter differentiation of the left temporal occipital junction (series 17, image 57). Mild localized vasogenic edema without significant regional mass effect. Associated susceptibility artifact consistent with blood products and/or necrosis. Finding suspicious for a small metastatic lesion given provided history. There is question of an additional 5 mm focus of enhancement at the superior right cerebellum (series 18,  image 8). Associated susceptibility artifact at this location (series 10, image 12). Finding is nonspecific, and could reflect a small additional metastatic lesion or possibly small vascular lesion. No other visible intracranial metastases. No other pathologic enhancement. Ventricles normal size without hydrocephalus. No extra-axial fluid collection. Pituitary gland and suprasellar region within normal limits. Vascular: Major intracranial vascular flow voids are maintained. Skull and upper cervical spine: Craniocervical junction within normal limits. Bone marrow signal intensity normal. No scalp soft tissue abnormality. Sinuses/Orbits: Globes and orbital soft tissues within normal limits. Paranasal sinuses are largely clear. Trace left mastoid effusion noted, of doubtful significance. Other: None. IMPRESSION: 1. 1.2 cm rim enhancing lesion positioned at the left temporoccipital junction, suspicious for a metastatic lesion given provided history. Mild localized edema without significant regional mass effect. 2. Question additional 5 mm focus of enhancement at the superior right cerebellum, nonspecific, and could reflect an additional metastasis or possibly a small vascular lesion. Attention at follow-up recommended. 3. No other acute intracranial abnormality. 4. Underlying age-related cerebral atrophy with moderately advanced chronic microvascular ischemic disease. Electronically Signed   By: Morene Hoard M.D.   On: 05/17/2024 21:08   DG Chest Portable 1 View Result Date: 05/17/2024 CLINICAL DATA:  Chest pain. EXAM: PORTABLE CHEST 1 VIEW COMPARISON:  Radiographs 04/26/2024 and 12/14/2021. Chest CT 04/21/2024. FINDINGS: 1509 hours. Previously demonstrated right paratracheal adenopathy may be slightly progressive. The heart size is stable. There are right subclavian and right common carotid vascular stents. Pulmonary nodularity seen by CT is not well visualized. The lungs appear clear. No evidence of pleural  effusion or pneumothorax. The bones appear unchanged. IMPRESSION:  1. No evidence of acute cardiopulmonary process. 2. Possible slight progression of known right paratracheal adenopathy, remaining suspicious for malignancy. Electronically Signed   By: Elsie Perone M.D.   On: 05/17/2024 16:46   CT ANGIO CHEST PE W OR WO CONTRAST Result Date: 05/17/2024 CLINICAL DATA:  Pulmonary embolism (PE) suspected, high prob. EXAM: CT ANGIOGRAPHY CHEST WITH CONTRAST TECHNIQUE: Multidetector CT imaging of the chest was performed using the standard protocol during bolus administration of intravenous contrast. Multiplanar CT image reconstructions and MIPs were obtained to evaluate the vascular anatomy. RADIATION DOSE REDUCTION: This exam was performed according to the departmental dose-optimization program which includes automated exposure control, adjustment of the mA and/or kV according to patient size and/or use of iterative reconstruction technique. CONTRAST:  75mL OMNIPAQUE  IOHEXOL  350 MG/ML SOLN COMPARISON:  CT scan chest from 04/21/2024. FINDINGS: Cardiovascular: There is small-to-moderate volume predominantly nonocclusive embolism involving the segmental and proximal subsegmental pulmonary artery branches throughout bilateral lungs (marked with electronic arrow sign on series 4). There is no right heart strain or lung infarction. Normal cardiac size. Physiological pericardial effusion. No aortic aneurysm. There are coronary artery calcifications, in keeping with coronary artery disease. There are also mild peripheral atherosclerotic vascular calcifications of thoracic aorta and its major branches. Mediastinum/Nodes: Visualized thyroid gland appears grossly unremarkable. 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 The mass also abuts ascending aorta, aortic arch and  descending thoracic aorta. The fat planes between the mass and the thoracic esophagus are also lost. The esophagus is nondistended precluding optimal assessment. There is mild circumferential thickening of the mid/lower thoracic esophagus, which is most likely seen in the settings of chronic gastroesophageal reflux disease versus esophagitis. No axillary lymphadenopathy by size criteria. Lungs/Pleura: The trachea and left bronchial tree are patent. Redemonstration of an approximately 8 x 17 mm soft tissue nodular thickening along the left wall of the bronchus intermedius, which is similar to the prior study. There are extensive frothy soft tissue in the bronchus intermedius and right lower lobe bronchial tree, favoring mucus/secretions versus aspiration. There are mild-to-moderate upper lobe predominant centrilobular emphysematous changes. There are patchy areas of linear, plate-like atelectasis and/or scarring throughout bilateral lungs. Redemonstration of several in regular solid noncalcified nodules in bilateral lungs, described as follows: *Linear configuration 4 x 19 mm nodule in the left lung upper lobe (series 12, image 34), unchanged since the prior study. *There is a slightly spiculated 7 x 12 mm nodule in the right lung lower lobe (series 12, image 91), unchanged since the prior study. *There are additional stable subcentimeter sized nodularity in the right upper lobe (series 12, images 62 and 78), unchanged since the prior study. No new or suspicious lung nodule. No new mass or consolidation. No pleural effusion or pneumothorax. Upper Abdomen: There is infiltrating soft tissue surrounding aorta in the visualized portion of upper abdomen which is also grossly similar to the prior study. Remaining visualized upper abdominal viscera within normal limits. Musculoskeletal: The visualized soft tissues of the chest wall are grossly unremarkable. No suspicious osseous lesions. There are mild multilevel  degenerative changes in the visualized spine. L1 superior endplate Schmorl's node again seen. Review of the MIP images confirms the above findings. IMPRESSION: 1. There is small-to-moderate volume predominantly nonocclusive embolism involving the segmental and proximal subsegmental pulmonary artery branches throughout bilateral lungs. No right heart strain or lung infarction. 2. Redemonstration of extensive mediastinal  soft tissue as well as soft tissue surrounding aorta in the visualized portion of upper abdomen which are grossly similar to the prior study. Findings favor lymphoma versus other metastatic disease. 3. There are extensive frothy soft tissue in the bronchus intermedius and right lower lobe bronchial tree, favoring mucus/secretions versus aspiration. Stable soft tissue nodular thickening along the left wall of the bronchus intermedius. 4. Stable pre-existing lung nodules, as described above. 5. Multiple other nonacute observations, as described above. Aortic Atherosclerosis (ICD10-I70.0) and Emphysema (ICD10-J43.9). Critical Value/emergent results were called by telephone at the time of interpretation on 05/17/2024 at 4:34 pm to provider Dr. Rocky Massy, who verbally acknowledged these results. Electronically Signed   By: Ree Molt M.D.   On: 05/17/2024 16:36     Discharge Exam: Vitals:   06/16/24 0935 06/16/24 1137  BP:    Pulse:    Resp:    Temp:    SpO2: 96% 93%   Vitals:   06/16/24 0425 06/16/24 0915 06/16/24 0935 06/16/24 1137  BP: (!) 144/85     Pulse: 88     Resp:      Temp: 98.6 F (37 C)     TempSrc: Oral     SpO2: 98% 93% 96% 93%  Weight:      Height:        General: Pt is alert, awake, not in acute distress Cardiovascular: RRR, S1/S2 +, no rubs, no gallops Respiratory: CTA bilaterally, no wheezing, no rhonchi Abdominal: Soft, NT, ND, bowel sounds + Extremities: no edema, no cyanosis    The results of significant diagnostics from this hospitalization  (including imaging, microbiology, ancillary and laboratory) are listed below for reference.     Microbiology: No results found for this or any previous visit (from the past 240 hours).   Labs: BNP (last 3 results) Recent Labs    05/17/24 1415 05/20/24 1632  BNP 42.3 33.8   Basic Metabolic Panel: Recent Labs  Lab 06/14/24 0830 06/14/24 0956 06/14/24 1028 06/15/24 0402 06/16/24 0404  NA 134* 133* 134* 136 134*  K 3.9 3.7 3.6 3.7 3.5  CL 99 101  --  101 101  CO2 20*  --   --  22 22  GLUCOSE 96 119*  --  81 92  BUN 9 10  --  14 19  CREATININE 0.74 0.70  --  0.68 0.61  CALCIUM  10.0  --   --  9.7 9.1   Liver Function Tests: Recent Labs  Lab 06/14/24 0830 06/15/24 0402  AST 19 13*  ALT 11 10  ALKPHOS 65 71  BILITOT 0.6 0.7  PROT 7.4 7.3  ALBUMIN  2.9* 3.0*   No results for input(s): LIPASE, AMYLASE in the last 168 hours. Recent Labs  Lab 06/14/24 1145  AMMONIA <13   CBC: Recent Labs  Lab 06/14/24 0830 06/14/24 0956 06/14/24 1028 06/15/24 0402 06/16/24 0404  WBC 4.5  --   --  5.3 5.3  NEUTROABS 3.5  --   --   --  4.2  HGB 10.4* 11.6* 11.2* 9.3* 9.0*  HCT 33.7* 34.0* 33.0* 29.5* 29.7*  MCV 83.2  --   --  81.9 83.4  PLT 372  --   --  356 309   Cardiac Enzymes: No results for input(s): CKTOTAL, CKMB, CKMBINDEX, TROPONINI in the last 168 hours. BNP: Invalid input(s): POCBNP CBG: No results for input(s): GLUCAP in the last 168 hours. D-Dimer No results for input(s): DDIMER in the last 72 hours. Hgb A1c No  results for input(s): HGBA1C in the last 72 hours. Lipid Profile No results for input(s): CHOL, HDL, LDLCALC, TRIG, CHOLHDL, LDLDIRECT in the last 72 hours. Thyroid function studies No results for input(s): TSH, T4TOTAL, T3FREE, THYROIDAB in the last 72 hours.  Invalid input(s): FREET3 Anemia work up No results for input(s): VITAMINB12, FOLATE, FERRITIN, TIBC, IRON, RETICCTPCT in the last 72  hours. Urinalysis    Component Value Date/Time   COLORURINE YELLOW 06/14/2024 1104   APPEARANCEUR HAZY (A) 06/14/2024 1104   LABSPEC 1.042 (H) 06/14/2024 1104   PHURINE 7.0 06/14/2024 1104   GLUCOSEU NEGATIVE 06/14/2024 1104   HGBUR NEGATIVE 06/14/2024 1104   BILIRUBINUR NEGATIVE 06/14/2024 1104   KETONESUR 20 (A) 06/14/2024 1104   PROTEINUR NEGATIVE 06/14/2024 1104   NITRITE NEGATIVE 06/14/2024 1104   LEUKOCYTESUR NEGATIVE 06/14/2024 1104   Sepsis Labs Recent Labs  Lab 06/14/24 0830 06/15/24 0402 06/16/24 0404  WBC 4.5 5.3 5.3   Microbiology No results found for this or any previous visit (from the past 240 hours).  FURTHER DISCHARGE INSTRUCTIONS:   Get Medicines reviewed and adjusted: Please take all your medications with you for your next visit with your Primary MD   Laboratory/radiological data: Please request your Primary MD to go over all hospital tests and procedure/radiological results at the follow up, please ask your Primary MD to get all Hospital records sent to his/her office.   In some cases, they will be blood work, cultures and biopsy results pending at the time of your discharge. Please request that your primary care M.D. goes through all the records of your hospital data and follows up on these results.   Also Note the following: If you experience worsening of your admission symptoms, develop shortness of breath, life threatening emergency, suicidal or homicidal thoughts you must seek medical attention immediately by calling 911 or calling your MD immediately  if symptoms less severe.   You must read complete instructions/literature along with all the possible adverse reactions/side effects for all the Medicines you take and that have been prescribed to you. Take any new Medicines after you have completely understood and accpet all the possible adverse reactions/side effects.    patient was instructed, not to drive, operate heavy machinery, perform  activities at heights, swimming or participation in water activities or provide baby-sitting services while on Pain, Sleep and Anxiety Medications; until their outpatient Physician has advised to do so again. Also recommended to not to take more than prescribed Pain, Sleep and Anxiety Medications.  It is not advisable to combine anxiety, sleep and pain medications without talking with your primary care provider.     Wear Seat belts while driving.   Please note: You were cared for by a hospitalist during your hospital stay. Once you are discharged, your primary care physician will handle any further medical issues. Please note that NO REFILLS for any discharge medications will be authorized once you are discharged, as it is imperative that you return to your primary care physician (or establish a relationship with a primary care physician if you do not have one) for your post hospital discharge needs so that they can reassess your need for medications and monitor your lab values  Time coordinating discharge: Over 30 minutes  SIGNED:   Fredia Skeeter, MD  [facility] 06/16/2024, 11:42 AM *Please note that this is a verbal dictation therefore any spelling or grammatical errors are due to the Dragon Medical One system interpretation. If 7PM-7AM, please contact night-coverage [email or web address]

## 2024-06-16 NOTE — Progress Notes (Signed)
 PT Cancellation Note  Patient Details Name: EVANIA LYNE MRN: [id number] DOB: 1961/12/04   Cancelled Treatment:    Reason Eval/Treat Not Completed: PT screened, no needs identified, will sign off  RN reports she has ambulated with pt and checked saturations.  RN reports no current PT needs at this time.  PT to sign off.   Kati L Payson 06/16/2024, 12:00 PM Tari KLEIN, DPT Physical Therapist Acute Rehabilitation Services Office: [phone]

## 2024-06-16 NOTE — Evaluation (Signed)
 Occupational Therapy Evaluation Patient Details Name: Kristina Howe MRN: [id number] DOB: 17-Aug-1961 Today's Date: 06/16/2024   History of Present Illness   63 yo female presented with worsening shortness of breath. 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     Clinical Impressions PTA pt lives independently with her boyfriend and sister, drives and is responsible for her own medication and financial management. Pt close to baseline regarding ADL tasks and mobility. Pt ambulated earlier with nsg on 2L and SpO2 above 90. Pt reports significant improvement with SOB. Educated pt/sister on energy conservation and strategies to reduce risk of falls. Will provide general HEP with theraband prior to DC. No follow up OT recommended after DC.       If plan is discharge home, recommend the following:   Assistance with cooking/housework;Direct supervision/assist for medications management;Direct supervision/assist for financial management;Assist for transportation     Functional Status Assessment   Patient has not had a recent decline in their functional status     Equipment Recommendations   Tub/shower seat (pt declines)     Recommendations for Other Services         Precautions/Restrictions   Precautions Precautions: None     Mobility Bed Mobility Overal bed mobility: Independent                  Transfers Overall transfer level: Independent                        Balance Overall balance assessment: No apparent balance deficits (not formally assessed)                                         ADL either performed or assessed  with clinical judgement   ADL Overall ADL's : At baseline                                             Vision Baseline Vision/History: 0 No visual deficits       Perception         Praxis         Pertinent Vitals/Pain Pain Assessment Pain Assessment: No/denies pain     Extremity/Trunk Assessment Upper Extremity Assessment Upper Extremity Assessment: Overall WFL for tasks assessed   Lower Extremity Assessment Lower Extremity Assessment: Overall WFL for tasks assessed       Communication Communication Communication: No apparent difficulties   Cognition Arousal: Alert Behavior During Therapy: WFL for tasks assessed/performed Cognition: No apparent impairments             OT - Cognition Comments: Pt slow to respond at times however sister reports close to baseline                 Following commands: Intact       Cueing  General Comments      Educated on energy conservation strategies using the 5 P's: pace, positioning, planning, prioritizing and pursed lip breathing. Educated on recommendation for use of a shower  chair however pt declined. Pt states her bf will assist as needed. Sister will also be present   Exercises Exercises: Other exercises Other Exercises Other Exercises: pursed lip breathing   Shoulder Instructions      Home Living Family/patient expects to be discharged to:: Private residence Living Arrangements: Spouse/significant other;Other relatives Available Help at Discharge: Family;Available 24 hours/day Type of Home: House Home Access: Stairs to enter [employer] of Steps: 2 Entrance Stairs-Rails: Right;Left Home Layout: One level     Bathroom Shower/Tub: Chief Strategy Officer: Standard Bathroom Accessibility: Yes How Accessible: Accessible via walker Home Equipment: None          Prior Functioning/Environment Prior Level of Function : Independent/Modified  Independent;Driving                    OT Problem List: Decreased strength   OT Treatment/Interventions:        OT Goals(Current goals can be found in the care plan section)   Acute Rehab OT Goals Patient Stated Goal: home today OT Goal Formulation: With patient   OT Frequency:       Co-evaluation              AM-PAC OT 6 Clicks Daily Activity     Outcome Measure Help from another person eating meals?: None Help from another person taking care of personal grooming?: None Help from another person toileting, which includes using toliet, bedpan, or urinal?: None Help from another person bathing (including washing, rinsing, drying)?: None Help from another person to put on and taking off regular upper body clothing?: None Help from another person to put on and taking off regular lower body clothing?: None 6 Click Score: 24   End of Session Equipment Utilized During Treatment: Oxygen (2L) Nurse Communication: Other (comment) (DC question about breathing treatment)  Activity Tolerance: Patient tolerated treatment well Patient left: in bed;with call bell/phone within reach;with family/visitor present  OT Visit Diagnosis: Muscle weakness (generalized) (M62.81)                Time: 8599-8582 OT Time Calculation (min): 17 min Charges:  OT General Charges $OT Visit: 1 Visit OT Evaluation $OT Eval Low Complexity: 1 Low  Hondo Nanda, OT/L   Acute OT Clinical Specialist Acute Rehabilitation Services Pager [phone] Office [phone]   [facility] 06/16/2024, 2:27 PM

## 2024-06-16 NOTE — Plan of Care (Signed)
  Problem: Education: Goal: Knowledge of General Education information will improve Description: Including pain rating scale, medication(s)/side effects and non-pharmacologic comfort measures 06/16/2024 1621 by Ivery Chiquita CROME, RN Outcome: Completed/Met 06/16/2024 1621 by Ivery Chiquita CROME, RN Outcome: Progressing   Problem: Health Behavior/Discharge Planning: Goal: Ability to manage health-related needs will improve 06/16/2024 1621 by Ivery Chiquita CROME, RN Outcome: Completed/Met 06/16/2024 1621 by Ivery Chiquita CROME, RN Outcome: Progressing   Problem: Clinical Measurements: Goal: Ability to maintain clinical measurements within normal limits will improve 06/16/2024 1621 by Ivery Chiquita CROME, RN Outcome: Completed/Met 06/16/2024 1621 by Ivery Chiquita CROME, RN Outcome: Progressing Goal: Will remain free from infection 06/16/2024 1621 by Ivery Chiquita CROME, RN Outcome: Completed/Met 06/16/2024 1621 by Ivery Chiquita CROME, RN Outcome: Progressing Goal: Diagnostic test results will improve 06/16/2024 1621 by Ivery Chiquita CROME, RN Outcome: Completed/Met 06/16/2024 1621 by Ivery Chiquita CROME, RN Outcome: Progressing Goal: Respiratory complications will improve 06/16/2024 1621 by Ivery Chiquita CROME, RN Outcome: Completed/Met 06/16/2024 1621 by Ivery Chiquita CROME, RN Outcome: Progressing Goal: Cardiovascular complication will be avoided 06/16/2024 1621 by Ivery Chiquita CROME, RN Outcome: Completed/Met 06/16/2024 1621 by Ivery Chiquita CROME, RN Outcome: Progressing   Problem: Activity: Goal: Risk for activity intolerance will decrease 06/16/2024 1621 by Ivery Chiquita CROME, RN Outcome: Completed/Met 06/16/2024 1621 by Ivery Chiquita CROME, RN Outcome: Progressing   Problem: Nutrition: Goal: Adequate nutrition will be maintained 06/16/2024 1621 by Ivery Chiquita CROME, RN Outcome: Completed/Met 06/16/2024 1621 by Ivery Chiquita CROME, RN Outcome: Progressing   Problem: Coping: Goal: Level of anxiety will decrease 06/16/2024 1621 by Ivery Chiquita CROME, RN Outcome:  Completed/Met 06/16/2024 1621 by Ivery Chiquita CROME, RN Outcome: Progressing   Problem: Elimination: Goal: Will not experience complications related to bowel motility 06/16/2024 1621 by Ivery Chiquita CROME, RN Outcome: Completed/Met 06/16/2024 1621 by Ivery Chiquita CROME, RN Outcome: Progressing Goal: Will not experience complications related to urinary retention 06/16/2024 1621 by Ivery Chiquita CROME, RN Outcome: Completed/Met 06/16/2024 1621 by Ivery Chiquita CROME, RN Outcome: Progressing   Problem: Pain Managment: Goal: General experience of comfort will improve and/or be controlled 06/16/2024 1621 by Ivery Chiquita CROME, RN Outcome: Completed/Met 06/16/2024 1621 by Ivery Chiquita CROME, RN Outcome: Progressing   Problem: Safety: Goal: Ability to remain free from injury will improve 06/16/2024 1621 by Ivery Chiquita CROME, RN Outcome: Completed/Met 06/16/2024 1621 by Ivery Chiquita CROME, RN Outcome: Progressing   Problem: Skin Integrity: Goal: Risk for impaired skin integrity will decrease 06/16/2024 1621 by Ivery Chiquita CROME, RN Outcome: Completed/Met 06/16/2024 1621 by Ivery Chiquita CROME, RN Outcome: Progressing

## 2024-06-17 ENCOUNTER — Other Ambulatory Visit: Payer: Self-pay

## 2024-06-17 ENCOUNTER — Ambulatory Visit
Admission: RE | Admit: 2024-06-17 | Discharge: 2024-06-17 | Disposition: A | Discharge status: Home / Self Care | Source: Ambulatory Visit | Attending: Radiation Oncology | Admitting: Radiation Oncology

## 2024-06-17 DIAGNOSIS — C3412 Malignant neoplasm of upper lobe, left bronchus or lung: Secondary | ICD-10-CM | POA: Diagnosis not present

## 2024-06-17 DIAGNOSIS — Z87891 Personal history of nicotine dependence: Secondary | ICD-10-CM | POA: Diagnosis not present

## 2024-06-17 DIAGNOSIS — C7931 Secondary malignant neoplasm of brain: Secondary | ICD-10-CM | POA: Diagnosis not present

## 2024-06-17 DIAGNOSIS — F419 Anxiety disorder, unspecified: Secondary | ICD-10-CM | POA: Insufficient documentation

## 2024-06-17 DIAGNOSIS — Z923 Personal history of irradiation: Secondary | ICD-10-CM | POA: Diagnosis not present

## 2024-06-17 DIAGNOSIS — Z9981 Dependence on supplemental oxygen: Secondary | ICD-10-CM | POA: Diagnosis not present

## 2024-06-17 DIAGNOSIS — C3431 Malignant neoplasm of lower lobe, right bronchus or lung: Secondary | ICD-10-CM | POA: Diagnosis not present

## 2024-06-17 DIAGNOSIS — Z510 Encounter for antineoplastic radiation therapy: Secondary | ICD-10-CM | POA: Diagnosis not present

## 2024-06-17 DIAGNOSIS — R079 Chest pain, unspecified: Secondary | ICD-10-CM | POA: Diagnosis not present

## 2024-06-17 LAB — RAD ONC ARIA SESSION SUMMARY
Course Elapsed Days: 2
Plan Fractions Treated to Date: 3
Plan Prescribed Dose Per Fraction: 3 Gy
Plan Total Fractions Prescribed: 10
Plan Total Prescribed Dose: 30 Gy
Reference Point Dosage Given to Date: 9 Gy
Reference Point Session Dosage Given: 3 Gy
Session Number: 3

## 2024-06-18 ENCOUNTER — Telehealth: Payer: Self-pay

## 2024-06-18 ENCOUNTER — Ambulatory Visit
Admission: RE | Admit: 2024-06-18 | Discharge: 2024-06-18 | Disposition: A | Discharge status: Home / Self Care | Source: Ambulatory Visit | Attending: Radiation Oncology

## 2024-06-18 ENCOUNTER — Other Ambulatory Visit: Payer: Self-pay

## 2024-06-18 DIAGNOSIS — C3431 Malignant neoplasm of lower lobe, right bronchus or lung: Secondary | ICD-10-CM | POA: Diagnosis not present

## 2024-06-18 DIAGNOSIS — Z87891 Personal history of nicotine dependence: Secondary | ICD-10-CM | POA: Diagnosis not present

## 2024-06-18 DIAGNOSIS — Z510 Encounter for antineoplastic radiation therapy: Secondary | ICD-10-CM | POA: Diagnosis not present

## 2024-06-18 LAB — RAD ONC ARIA SESSION SUMMARY
Course Elapsed Days: 3
Plan Fractions Treated to Date: 4
Plan Prescribed Dose Per Fraction: 3 Gy
Plan Total Fractions Prescribed: 10
Plan Total Prescribed Dose: 30 Gy
Reference Point Dosage Given to Date: 12 Gy
Reference Point Session Dosage Given: 3 Gy
Session Number: 4

## 2024-06-18 NOTE — Telephone Encounter (Signed)
 Pt called asking about Lovenox  refill. Pt reported she has 5 days left.  Contacted Dr. Pearline.  Awaiting response. Advised pt to check pharmacy over the weekend and if not refilled, to call back 06/21/24.  After further chart review, it appears Dr. Raenelle prescribed pt's lovenox .   Pt advised to follow up with Dr. Raenelle for refills.  Pt verbally agreed.

## 2024-06-21 ENCOUNTER — Other Ambulatory Visit: Payer: Self-pay

## 2024-06-21 ENCOUNTER — Ambulatory Visit
Admission: RE | Admit: 2024-06-21 | Discharge: 2024-06-21 | Disposition: A | Discharge status: Home / Self Care | Source: Ambulatory Visit | Attending: Radiation Oncology

## 2024-06-21 DIAGNOSIS — Z87891 Personal history of nicotine dependence: Secondary | ICD-10-CM | POA: Diagnosis not present

## 2024-06-21 DIAGNOSIS — C3431 Malignant neoplasm of lower lobe, right bronchus or lung: Secondary | ICD-10-CM | POA: Diagnosis not present

## 2024-06-21 DIAGNOSIS — Z510 Encounter for antineoplastic radiation therapy: Secondary | ICD-10-CM | POA: Diagnosis not present

## 2024-06-21 LAB — RAD ONC ARIA SESSION SUMMARY
Course Elapsed Days: 6
Plan Fractions Treated to Date: 5
Plan Prescribed Dose Per Fraction: 3 Gy
Plan Total Fractions Prescribed: 10
Plan Total Prescribed Dose: 30 Gy
Reference Point Dosage Given to Date: 15 Gy
Reference Point Session Dosage Given: 3 Gy
Session Number: 5

## 2024-06-22 ENCOUNTER — Inpatient Hospital Stay

## 2024-06-22 ENCOUNTER — Inpatient Hospital Stay: Admitting: Internal Medicine

## 2024-06-22 ENCOUNTER — Ambulatory Visit
Admission: RE | Admit: 2024-06-22 | Discharge: 2024-06-22 | Disposition: A | Discharge status: Home / Self Care | Source: Ambulatory Visit | Attending: Radiation Oncology

## 2024-06-22 ENCOUNTER — Telehealth: Payer: Self-pay | Admitting: Medical Oncology

## 2024-06-22 ENCOUNTER — Inpatient Hospital Stay (HOSPITAL_BASED_OUTPATIENT_CLINIC_OR_DEPARTMENT_OTHER): Admitting: Internal Medicine

## 2024-06-22 ENCOUNTER — Other Ambulatory Visit: Payer: Self-pay

## 2024-06-22 VITALS — BP 107/78 | HR 110 | Temp 97.9°F | Resp 17 | Ht 67.0 in | Wt 97.8 lb

## 2024-06-22 DIAGNOSIS — Z510 Encounter for antineoplastic radiation therapy: Secondary | ICD-10-CM | POA: Diagnosis not present

## 2024-06-22 DIAGNOSIS — Z87891 Personal history of nicotine dependence: Secondary | ICD-10-CM | POA: Diagnosis not present

## 2024-06-22 DIAGNOSIS — C3431 Malignant neoplasm of lower lobe, right bronchus or lung: Secondary | ICD-10-CM

## 2024-06-22 LAB — CMP (CANCER CENTER ONLY)
ALT: 7 U/L (ref 0–44)
AST: 14 U/L — ABNORMAL LOW (ref 15–41)
Albumin: 3.2 g/dL — ABNORMAL LOW (ref 3.5–5.0)
Alkaline Phosphatase: 88 U/L (ref 38–126)
Anion gap: 9 (ref 5–15)
BUN: 16 mg/dL (ref 8–23)
CO2: 26 mmol/L (ref 22–32)
Calcium: 9.7 mg/dL (ref 8.9–10.3)
Chloride: 97 mmol/L — ABNORMAL LOW (ref 98–111)
Creatinine: 0.6 mg/dL (ref 0.44–1.00)
GFR, Estimated: >60 mL/min (ref >60)
Glucose, Bld: 112 mg/dL — ABNORMAL HIGH (ref 70–99)
Potassium: 4 mmol/L (ref 3.5–5.1)
Sodium: 132 mmol/L — ABNORMAL LOW (ref 135–145)
Total Bilirubin: 0.7 mg/dL (ref 0.0–1.2)
Total Protein: 7.3 g/dL (ref 6.5–8.1)

## 2024-06-22 LAB — RAD ONC ARIA SESSION SUMMARY
Course Elapsed Days: 7
Plan Fractions Treated to Date: 6
Plan Prescribed Dose Per Fraction: 3 Gy
Plan Total Fractions Prescribed: 10
Plan Total Prescribed Dose: 30 Gy
Reference Point Dosage Given to Date: 18 Gy
Reference Point Session Dosage Given: 3 Gy
Session Number: 6

## 2024-06-22 LAB — CBC WITH DIFFERENTIAL (CANCER CENTER ONLY)
Abs Immature Granulocytes: 0.03 K/uL (ref 0.00–0.07)
Basophils Absolute: 0.1 K/uL (ref 0.0–0.1)
Basophils Relative: 1 %
Eosinophils Absolute: 0.1 K/uL (ref 0.0–0.5)
Eosinophils Relative: 1 %
HCT: 31 % — ABNORMAL LOW (ref 36.0–46.0)
Hemoglobin: 9.8 g/dL — ABNORMAL LOW (ref 12.0–15.0)
Immature Granulocytes: 1 %
Lymphocytes Relative: 4 %
Lymphs Abs: 0.3 K/uL — ABNORMAL LOW (ref 0.7–4.0)
MCH: 25.3 pg — ABNORMAL LOW (ref 26.0–34.0)
MCHC: 31.6 g/dL (ref 30.0–36.0)
MCV: 79.9 fL — ABNORMAL LOW (ref 80.0–100.0)
Monocytes Absolute: 0.6 K/uL (ref 0.1–1.0)
Monocytes Relative: 10 %
Neutro Abs: 5.1 K/uL (ref 1.7–7.7)
Neutrophils Relative %: 83 %
Platelet Count: 204 K/uL (ref 150–400)
RBC: 3.88 MIL/uL (ref 3.87–5.11)
RDW: 16.5 % — ABNORMAL HIGH (ref 11.5–15.5)
WBC Count: 6.2 K/uL (ref 4.0–10.5)
nRBC: 0 % (ref 0.0–0.2)

## 2024-06-22 NOTE — Telephone Encounter (Signed)
 Pt appts changed and confirmed with pt .

## 2024-06-22 NOTE — Progress Notes (Signed)
 [facility] [phone]   [phone]  OFFICE PROGRESS NOTE  [name] BROCKS, FNP [address] [place] [address]  DIAGNOSIS: Stage IVB (T1b, N3, M1b) non-small cell lung cancer, adenocarcinoma presented with nodules in the right lower lobe, left upper lobe, enlarging lymphadenopathy in the mediastinum, subcarinal and supraclavicular as well as brain metastasis diagnosed in May 2025.  Biomarker Findings Blood Tumor Mutational Burden - 9 Muts/Mb ctDNA Tumor Fraction - High (60%) Microsatellite status - MSI-High Not Detected Genomic Findings For a complete list of the genes assayed, please refer to the Appendix. MET amplification CDK12 [id number]*7 KEAP1 G430A STK11 splice site [id number] TP53 Y234C This assay tested >300 cancer-related genes, including the following 8 gene(s) routinely assessed in this tumor type: ALK, BRAF, EGFR, ERBB2, KRAS, MET, RET, ROS1.  PRIOR THERAPY:1) palliative radiotherapy to the right chest hilar and mediastinal mass under the care of Dr. Dewey.  CURRENT THERAPY:  INTERVAL HISTORY: Kristina Howe 63 y.o. female returns to the clinic today for  Discussed the use of AI scribe software for clinical note transcription with the patient, who gave verbal consent to proceed.  History of Present Illness Kristina Howe is a 63 year old female with stage four non-small cell lung cancer who presents with difficulty breathing. She is accompanied by her sister from [place]  and her boyfriend.  She has stage four non-small cell lung cancer, adenocarcinoma, with nodules in the right lower lobe, left upper lobe, and enlarging lymphadenopathy in the mediastinum, subcarinal, and subarachnoid regions. Brain metastasis was diagnosed in May 2024. Molecular studies showed no actionable mutation except for MET amplification.  She experiences significant difficulty breathing, stating 'I'm not breathing too well.' Shortness  of breath occurs with minimal exertion, such as walking to the bathroom. She is currently on three liters of oxygen at home. Despite receiving radiation therapy to the chest, there has been little improvement in her breathing.  She has undergone stereotactic radiation therapy for brain metastasis, with three treatments completed.  She experiences chest pain, particularly in the right hand area, and reports difficulty sleeping at night. She is not currently taking any pain medication but wants it.  She has anxiety and runs out of her anxiety medication, previously prescribed Xanax  and Klonopin , before it is time for a refill. She finds Klonopin  more effective but has difficulty obtaining it.  Her appetite is described as 'pretty decent,' and she has gained one pound recently. She has previously been prescribed a Medrol  dose pack but did not complete it due to feeling unwell, possibly related to concurrent use of other medications like Xanax  and Klonopin .     MEDICAL HISTORY: Past Medical History:  Diagnosis Date   Anemia 04/02/2024   hgb = 9   Anxiety    tx w/ xanax  and celexa    Cancer (HCC)    Elevated cholesterol    Hypertension    Subclavian artery stenosis, right (HCC) 04/19/2024   right forearm pain;  thrombophlebitis of her superficial basilic forearm veins- instructed her to take ASA 81 mg & warm compresses    ALLERGIES:  has no known allergies.  MEDICATIONS:  Current Outpatient Medications  Medication Sig Dispense Refill   albuterol  (VENTOLIN  HFA) 108 (90 Base) MCG/ACT inhaler Inhale 2 puffs into the lungs every 6 (six) hours as needed for wheezing or shortness of breath. (Patient taking differently: Inhale 2 puffs into the lungs every 4 (four) hours as needed for wheezing  or shortness of breath.) 6.7 g 2   ALPRAZolam  (XANAX ) 1 MG tablet Take 1 tablet (1 mg total) by mouth 3 (three) times daily as needed for anxiety. (Patient taking differently: Take 1 mg by mouth 3 (three)  times daily.) 30 tablet 0   atorvastatin  (LIPITOR) 40 MG tablet Take 1 tablet (40 mg total) by mouth daily. 30 tablet 0   enoxaparin  (LOVENOX ) 60 MG/0.6ML injection Inject 0.5 mLs (50 mg total) into the skin every 12 (twelve) hours. 36 mL 0   ferrous sulfate  325 (65 FE) MG tablet Take 325 mg by mouth daily with breakfast.     FLUoxetine  (PROZAC ) 20 MG capsule Take 20 mg by mouth daily.     Multiple Vitamins-Minerals (MULTIVITAMIN WITH MINERALS) tablet Take 1 tablet by mouth daily with breakfast.     oxyCODONE  (OXY IR/ROXICODONE ) 5 MG immediate release tablet Take 1 tablet (5 mg total) by mouth every 6 (six) hours as needed for severe pain (pain score 7-10). 30 tablet 0   triamterene -hydrochlorothiazide  (MAXZIDE -25) 37.5-25 MG per tablet Take 1 tablet by mouth daily.       valsartan (DIOVAN) 320 MG tablet Take 320 mg by mouth daily.     No current facility-administered medications for this visit.    SURGICAL HISTORY:  Past Surgical History:  Procedure Laterality Date   ABDOMINAL HYSTERECTOMY     AORTOGRAM N/A 03/11/2024   Procedure: AORTIC ARCH ANGIOGRAM;  Surgeon: Pearline Norman RAMAN, MD;  Location: [facility] OR;  Service: Vascular;  Laterality: N/A;   BRONCHIAL BIOPSY  04/26/2024   Procedure: BRONCHOSCOPY, WITH BIOPSY;  Surgeon: Shelah Lamar RAMAN, MD;  Location: [facility] ENDOSCOPY;  Service: Pulmonary;;   BRONCHIAL BRUSHINGS  04/26/2024   Procedure: BRONCHOSCOPY, WITH BRUSH BIOPSY;  Surgeon: Shelah Lamar RAMAN, MD;  Location: [facility]OS[facility];  Service: Pulmonary;;   BRONCHIAL NEEDLE ASPIRATION BIOPSY  04/26/2024   Procedure: BRONCHOSCOPY, WITH NEEDLE ASPIRATION BIOPSY;  Surgeon: Shelah Lamar RAMAN, MD;  Location: [facility] ENDOSCOPY;  Service: Pulmonary;;   BRONCHIAL WASHINGS  04/26/2024   Procedure: IRRIGATION, BRONCHUS;  Surgeon: Shelah Lamar RAMAN, MD;  Location: [facility] ENDOSCOPY;  Service: Pulmonary;;   BRONCHOSCOPY, WITH BIOPSY USING ELECTROMAGNETIC NAVIGATION Right 04/26/2024   Procedure: BRONCHOSCOPY, WITH BIOPSY USING  ELECTROMAGNETIC NAVIGATION;  Surgeon: Shelah Lamar RAMAN, MD;  Location: [facility] ENDOSCOPY;  Service: Pulmonary;  Laterality: Right;   COLONOSCOPY  2024   INSERTION OF RETROGRADE CAROTID STENT Right 03/11/2024   Procedure: INSERTION RIGHT SUBCLAVIAN ARTERY AND RIGHT COMMON CAROTID ARTERY STENTS;  Surgeon: Pearline Norman RAMAN, MD;  Location: [facility] OR;  Service: Vascular;  Laterality: Right;   THORACIC EXPOSURE Right 03/11/2024   Procedure: DIRECT EXPOSURE OF RIGHT COMMON CAROTID ARTERY;  Surgeon: Pearline Norman RAMAN, MD;  Location: [facility] OR;  Service: Vascular;  Laterality: Right;   ULTRASOUND GUIDANCE FOR VASCULAR ACCESS Right 03/11/2024   Procedure: ULTRASOUND GUIDANCE, FOR VASCULAR ACCESS, RIGHT RADIAL ARTERY AND RIGHT FEMORAL ARTERY;  Surgeon: Pearline Norman RAMAN, MD;  Location: [facility] OR;  Service: Vascular;  Laterality: Right;   UPPER EXTREMITY ANGIOGRAM Right 03/11/2024   Procedure: GERALYN, RIGHT UPPER EXTREMITY;  Surgeon: Pearline Norman RAMAN, MD;  Location: [facility] OR;  Service: Vascular;  Laterality: Right;   UPPER GI ENDOSCOPY  2022   VIDEO BRONCHOSCOPY WITH ENDOBRONCHIAL ULTRASOUND Right 04/26/2024   Procedure: BRONCHOSCOPY, WITH EBUS;  Surgeon: Shelah Lamar RAMAN, MD;  Location: [facility] ENDOSCOPY;  Service: Pulmonary;  Laterality: Right;  Possible robotic navigation as well depending on CT results    REVIEW OF SYSTEMS:  Constitutional: positive for anorexia, fatigue, and weight loss Eyes: negative Ears, nose, mouth, throat, and face: negative Respiratory: positive for cough and dyspnea on exertion Cardiovascular: negative Gastrointestinal: negative Genitourinary:negative Integument/breast: negative Hematologic/lymphatic: negative Musculoskeletal:negative Neurological: negative Behavioral/Psych: negative Endocrine: negative Allergic/Immunologic: negative   PHYSICAL EXAMINATION: General appearance: alert, cooperative, fatigued, and mild distress Head: Normocephalic, without obvious abnormality, atraumatic Neck: no  adenopathy, no JVD, supple, symmetrical, trachea midline, and thyroid not enlarged, symmetric, no tenderness/mass/nodules Lymph nodes: Cervical, supraclavicular, and axillary nodes normal. Resp: clear to auscultation bilaterally Back: symmetric, no curvature. ROM normal. No CVA tenderness. Cardio: regular rate and rhythm, S1, S2 normal, no murmur, click, rub or gallop GI: soft, non-tender; bowel sounds normal; no masses,  no organomegaly Extremities: extremities normal, atraumatic, no cyanosis or edema Neurologic: Alert and oriented X 3, normal strength and tone. Normal symmetric reflexes. Normal coordination and gait  ECOG PERFORMANCE STATUS: 1 - Symptomatic but completely ambulatory  Blood pressure 107/78, pulse (!) 110, temperature 97.9 F (36.6 C), resp. rate 17, height 5' 7 (1.702 m), weight 97 lb 12.8 oz (44.4 kg), SpO2 94%.  LABORATORY DATA: Lab Results  Component Value Date   WBC 6.2 06/22/2024   HGB 9.8 (L) 06/22/2024   HCT 31.0 (L) 06/22/2024   MCV 79.9 (L) 06/22/2024   PLT 204 06/22/2024      Chemistry      Component Value Date/Time   NA 134 (L) 06/16/2024 0404   K 3.5 06/16/2024 0404   CL 101 06/16/2024 0404   CO2 22 06/16/2024 0404   BUN 19 06/16/2024 0404   CREATININE 0.61 06/16/2024 0404   CREATININE 0.82 05/11/2024 1342      Component Value Date/Time   CALCIUM  9.1 06/16/2024 0404   ALKPHOS 71 06/15/2024 0402   AST 13 (L) 06/15/2024 0402   AST 17 05/11/2024 1342   ALT 10 06/15/2024 0402   ALT 8 05/11/2024 1342   BILITOT 0.7 06/15/2024 0402   BILITOT 0.6 05/11/2024 1342       RADIOGRAPHIC STUDIES: CT Angio Chest PE W and/or Wo Contrast Result Date: 06/14/2024 CLINICAL DATA:  Concern for pulmonary embolism.  Lung cancer. EXAM: CT ANGIOGRAPHY CHEST WITH CONTRAST TECHNIQUE: Multidetector CT imaging of the chest was performed using the standard protocol during bolus administration of intravenous contrast. Multiplanar CT image reconstructions and MIPs were  obtained to evaluate the vascular anatomy. RADIATION DOSE REDUCTION: This exam was performed according to the departmental dose-optimization program which includes automated exposure control, adjustment of the mA and/or kV according to patient size and/or use of iterative reconstruction technique. CONTRAST:  75mL OMNIPAQUE  IOHEXOL  350 MG/ML SOLN COMPARISON:  CT dated 05/24/2024. FINDINGS: Cardiovascular: There is no cardiomegaly. Interval increase in the size of the pericardial effusion measuring approximately 1 cm in thickness anterior to the heart. There is mild atherosclerotic calcification of the thoracic aorta. No aneurysmal dilatation or dissection. Stents in the proximal right common carotid and subclavian arteries noted. The origins of the great vessels of the aortic arch are patent. Small nonocclusive right upper lobe segmental pulmonary artery embolus similar to prior CT. No new pulmonary embolus. No evidence of right heart straining. Mediastinum/Nodes: Bulky hilar and mediastinal adenopathy progressed since the prior CT. Bilateral supraclavicular adenopathy noted. There is mass effect and compression of the midportion of the esophagus by the mediastinal adenopathy. No mediastinal fluid collection. Lungs/Pleura: There is complete occlusion of the bronchus intermedius and right middle and right lower lobe bronchi with complete collapse of the right middle and  right lower lobes. This is likely related to tumor invasion. There is a small right pleural effusion. There is background of emphysema. Linear scarring in the left apex. No pneumothorax. Upper Abdomen: No acute abnormality. Musculoskeletal: Osteopenia.  No acute osseous pathology. Review of the MIP images confirms the above findings. IMPRESSION: 1. Small nonocclusive right upper lobe segmental pulmonary artery embolus similar to prior CT. No new pulmonary embolus. No evidence of right heart straining. 2. Complete occlusion of the bronchus intermedius  and right middle and right lower lobe bronchi with complete collapse of the right middle and right lower lobes. 3. Small right pleural effusion. 4. Bulky hilar and mediastinal adenopathy progressed since the prior CT. 5. Aortic Atherosclerosis (ICD10-I70.0) and Emphysema (ICD10-J43.9). Electronically Signed   By: Vanetta Chou M.D.   On: 06/14/2024 11:55   CT Head Wo Contrast Result Date: 06/14/2024 CLINICAL DATA:  Mental status change, unknown cause EXAM: CT HEAD WITHOUT CONTRAST TECHNIQUE: Contiguous axial images were obtained from the base of the skull through the vertex without intravenous contrast. RADIATION DOSE REDUCTION: This exam was performed according to the departmental dose-optimization program which includes automated exposure control, adjustment of the mA and/or kV according to patient size and/or use of iterative reconstruction technique. COMPARISON:  MRI of the head dated May 26, 2024. FINDINGS: Brain: Age-related cerebral volume loss. Area of diminished attenuation within the left posterior temporal lobe corresponding with the ring-enhancing lesion noted on the previous MRI. There is also moderate subcortical and deep cerebral white matter disease. No discrete lesions are evident within the cerebellar hemispheres. Vascular: Negative. Skull: Unremarkable. Sinuses/Orbits: Normal. Other: None. IMPRESSION: 1. Focal area of diminished attenuation at the left temporal occipital junction corresponding with the likely metastatic lesion seen on previous MRI. The other lesions are not evident, but the current study is limited by the absence of intravenous contrast. 2. Moderate cerebral white matter disease. Electronically Signed   By: Evalene Coho M.D.   On: 06/14/2024 11:54   DG Chest Portable 1 View Result Date: 06/14/2024 CLINICAL DATA:  Shortness of breath, known stage IV lung cancer and COPD EXAM: PORTABLE CHEST 1 VIEW COMPARISON:  Prior chest x-ray 06/01/2024 FINDINGS: Unchanged contour of  the mediastinum consistent with bulky mediastinal lymphadenopathy. Chronic bronchitic changes are similar compared to prior. Probable right middle and right lower lobe atelectasis is similar compared to prior. No new effusion or pneumothorax. No acute osseous abnormality. IMPRESSION: Stable appearance of the chest without significant interval change compared to 06/01/2024. Right middle and right lower lobe atelectasis with bulky mediastinal adenopathy. Electronically Signed   By: Wilkie Lent M.D.   On: 06/14/2024 08:15   DG Chest Portable 1 View Result Date: 06/01/2024 CLINICAL DATA:  Shortness of breath EXAM: PORTABLE CHEST 1 VIEW COMPARISON:  05/23/2024 FINDINGS: Opacity in the right lower hemithorax could reflect a combination of airspace disease and effusion. Left lung clear. Heart is normal size. Again noted is bulky mediastinal adenopathy as seen on prior CT. No acute bony abnormality. IMPRESSION: New opacification in the right lower chest may reflect a combination of effusion and airspace disease/collapse. Bulky mediastinal adenopathy. Electronically Signed   By: Franky Crease M.D.   On: 06/01/2024 16:17   MR Brain W Wo Contrast Result Date: 05/26/2024 CLINICAL DATA:  Metastatic disease evaluation 3T SRS Protocol for radiation treatment planning EXAM: MRI HEAD WITHOUT AND WITH CONTRAST TECHNIQUE: Multiplanar, multiecho pulse sequences of the brain and surrounding structures were obtained without and with intravenous contrast. CONTRAST:  5mL  GADAVIST  GADOBUTROL  1 MMOL/ML IV SOLN COMPARISON:  MRI of the brain dated May 17, 2024. FINDINGS: Brain: An ovoid peripherally enhancing lesion is again demonstrated left posterior temporal lobe, measuring approximately 12 x 11 x 10 mm. There are additional nodular foci of enhancement present within the right cerebellar hemisphere, which are concerning for metastatic disease. The largest lesion measures approximately 3 mm in diameter and is seen on image 114 of  series 1100. There are smaller focal areas of concerning enhancement present within the right cerebellum on images one hundred three, 105 and 108. There is moderate diffuse periventricular and deep cerebral white matter disease present. Vascular: Normal vascular flow voids. Skull and upper cervical spine: Normal signal intensity. Sinuses/Orbits: Negative. Other: None. IMPRESSION: 1. There are 4 areas of concerning enhancement present within the right cerebellar hemisphere, which are suspicious for metastatic disease. These are in addition to the highly suspicious lesion present within the left posterior temporal lobe. 2. Advanced cerebral white matter disease. Electronically Signed   By: Evalene Coho M.D.   On: 05/26/2024 16:07   CT Angio Chest Pulmonary Embolism (PE) W or WO Contrast Result Date: 05/24/2024 CLINICAL DATA:  63 year old female with lung cancer. Pulmonary emboli, predominantly nonocclusive, affecting bilateral pulmonary arteries on CTA last week. Near syncope. * Tracking Code: BO * EXAM: CT ANGIOGRAPHY CHEST WITH CONTRAST TECHNIQUE: Multidetector CT imaging of the chest was performed using the standard protocol during bolus administration of intravenous contrast. Multiplanar CT image reconstructions and MIPs were obtained to evaluate the vascular anatomy. RADIATION DOSE REDUCTION: This exam was performed according to the departmental dose-optimization program which includes automated exposure control, adjustment of the mA and/or kV according to patient size and/or use of iterative reconstruction technique. CONTRAST:  75mL OMNIPAQUE  IOHEXOL  350 MG/ML SOLN COMPARISON:  CTA chest 05/17/2024, PET-CT 05/21/2024, and earlier. FINDINGS: Cardiovascular: Excellent contrast bolus timing in the pulmonary arterial tree. Mass effect on the central pulmonary arteries related to bulky mediastinal tumor. Stable to slightly smaller nonocclusive thrombi in left upper lobe branches seen on series 7, images 134,  146. No saddle or lobar embolus. Stable to smaller contralateral right lower lobe nonocclusive PE on series 7, image 175. Stable more occlusive appearing right lower lobe clot image 203. Stable nonocclusive thrombus in branches of the right upper lobe visible on images 120 and 124. But in lateral segment right middle lobe branches there is progressed segmental thrombus series 7, images 179 and 188. Nearby medial segmental clot appears stable on image 189. Heart size remains normal. No pericardial effusion. Calcified aortic atherosclerosis. Proximal great vessels appear to remain patent, the proximal right CCA and subclavian arteries are stented as before. Mediastinum/Nodes: Bulky diffuse mediastinal lobulated tumor, bilateral mediastinal lymphadenopathy. Not significantly changed from the CTA last week or the recent PET-CT. Lungs/Pleura: Mediastinal tumor nearly occludes the bronchus intermedius on series 6, image 60, unchanged from recent exams and distal right middle and lower lobe airway ventilation has mildly improved since 05/17/2024, with less retained secretions. No new airway stenosis. Background centrilobular emphysema in both lungs. Stable ventilation since 05/17/2024. No pneumothorax or pleural effusion. Right lower lobe peribronchial 11 mm lung nodule/mass redemonstrated on series 6, image 91. Linear left upper lobe probable lung scarring on image 24. Mild scarring versus nodularity in the lateral right upper lobe is stable on image 57. More curvilinear bilateral costophrenic angle scarring is stable. Upper Abdomen: Abdominal Calcified aortic atherosclerosis. Negative partially visible a centrally noncontrast liver, spleen, adrenal glands, left kidney, and bowel in  the upper abdomen. Musculoskeletal: Stable visualized osseous structures. T10 congenital butterfly vertebra, normal variant. Degenerative appearing L1 superior endplate Schmorl's node is stable. No acute or destructive osseous lesion  identified in the chest. Review of the MIP images confirms the above findings. IMPRESSION: 1. Advanced Thoracic Malignancy. Bulky mediastinal tumor invading and nearly occluding the right bronchus intermedius, bilateral hilar lymphadenopathy not significantly changed from PET-CT 3 days ago. 2. Generally stable pulmonary artery branch PE since the CTA on 05/17/2024. However, there is increased more occlusive appearing segmental pulmonary embolus in the lateral right middle lobe segmental branches. But most of the other pulmonary artery branch involvement is nonocclusive, and there is no lobar or saddle embolus. 3. Stable Emphysema (ICD10-J43.9). Stable bilateral lung ventilation with several lung nodules which were evaluated on recent PET. 4.  Aortic Atherosclerosis (ICD10-I70.0). Electronically Signed   By: VEAR Hurst M.D.   On: 05/24/2024 05:16   DG Chest Portable 1 View Result Date: 05/23/2024 CLINICAL DATA:  Syncope Pt reports that she feels like she is going to pass out x 2 days with worsening today. Pt has Stage 3 lung cancer and recently diagnosed with a PE. Pt reports chest pain and SOB, as well. EXAM: PORTABLE CHEST 1 VIEW COMPARISON:  Chest x-ray 05/20/2024, CT chest 05/17/2024 FINDINGS: The heart and mediastinal contours are unchanged with superior mediastinal irregular contour best evaluated on CT angio chest 07/17/2024. Chronic coarsened interstitial markings with no overt pulmonary edema. No focal consolidation. No pleural effusion. No pneumothorax. No acute osseous abnormality. IMPRESSION: 1. No acute cardiopulmonary disease. 2. Superior mediastinal irregular contour best evaluated on CT angio chest 07/17/2024. Electronically Signed   By: Morgane  Naveau M.D.   On: 05/23/2024 22:11    ASSESSMENT AND PLAN: This is a very pleasant 63 years old white female with Stage IVB (T1b, N3, M1b) non-small cell lung cancer, adenocarcinoma presented with nodules in the right lower lobe, left upper lobe, enlarging  lymphadenopathy in the mediastinum, subcarinal and supraclavicular as well as brain metastasis diagnosed in May 2025. Assessment & Plan Stage 4 non-small cell lung cancer with metastasis Stage 4 non-small cell lung cancer, adenocarcinoma subtype, with metastasis to the brain, right lower lobe, left upper lobe, mediastinum, subcarina, and subarachnoid. No actionable mutations except for MET amplification. Current treatment includes radiation therapy to the chest and brain to shrink tumors and improve airway obstruction. Chemotherapy and immunotherapy are potential future options, but her current physical condition is not optimal for these treatments. Radiation is not curative but aims to control symptoms and potentially prolong life. - Continue radiation therapy to the chest and brain, with last session on June 28, 2024 - Evaluate post-radiation for potential chemotherapy and immunotherapy - Administer Medrol  dose pack to improve breathing and appetite  Breathing difficulty due to lung cancer Breathing difficulty due to airway obstruction from lung cancer. Radiation therapy is being used to shrink the tumor and open the airway. Current oxygen therapy at 3 liters at home. Steroids are prescribed to aid in breathing. - Continue oxygen therapy at 3 liters at home - Administer Medrol  dose pack to improve breathing  Chest pain Chest pain likely related to lung cancer and associated treatments. No current pain medication use. Referral to palliative care for pain management is planned. - Refer to palliative care team for pain management - Consider prescribing Percocet for pain management  Anxiety Anxiety exacerbated by current health condition. Previous use of Klonopin  was beneficial but not currently prescribed. Referral to palliative care for  anxiety management is planned. - Refer to palliative care team for anxiety management - Discuss potential anxiety medication options with palliative care  team The patient was advised to call immediately if she has any concerning symptoms in the interval. The patient voices understanding of current disease status and treatment options and is in agreement with the current care plan.  All questions were answered. The patient knows to call the clinic with any problems, questions or concerns. We can certainly see the patient much sooner if necessary.  The total time spent in the appointment was 40 minutes including review of chart and various tests results, discussions about plan of care and coordination of care plan .   Disclaimer: This note was dictated with voice recognition software. Similar sounding words can inadvertently be transcribed and may not be corrected upon review.

## 2024-06-22 NOTE — Telephone Encounter (Signed)
 Pt no  show today . I called her and she thought the appt was at 1000. She has xrt today at 72.  I will r/s  her for labs after xrt and f/u with Mohamed at 2 pm today . Pt confirmed

## 2024-06-23 ENCOUNTER — Telehealth: Payer: Self-pay | Admitting: Internal Medicine

## 2024-06-23 ENCOUNTER — Ambulatory Visit
Admission: RE | Admit: 2024-06-23 | Discharge: 2024-06-23 | Disposition: A | Discharge status: Home / Self Care | Source: Ambulatory Visit | Attending: Radiation Oncology | Admitting: Radiation Oncology

## 2024-06-23 ENCOUNTER — Other Ambulatory Visit: Payer: Self-pay

## 2024-06-23 DIAGNOSIS — C3431 Malignant neoplasm of lower lobe, right bronchus or lung: Secondary | ICD-10-CM | POA: Diagnosis not present

## 2024-06-23 DIAGNOSIS — Z510 Encounter for antineoplastic radiation therapy: Secondary | ICD-10-CM | POA: Diagnosis not present

## 2024-06-23 DIAGNOSIS — Z87891 Personal history of nicotine dependence: Secondary | ICD-10-CM | POA: Diagnosis not present

## 2024-06-23 DIAGNOSIS — C3490 Malignant neoplasm of unspecified part of unspecified bronchus or lung: Secondary | ICD-10-CM | POA: Diagnosis not present

## 2024-06-23 DIAGNOSIS — Z515 Encounter for palliative care: Secondary | ICD-10-CM | POA: Diagnosis not present

## 2024-06-23 LAB — RAD ONC ARIA SESSION SUMMARY
Course Elapsed Days: 8
Plan Fractions Treated to Date: 7
Plan Prescribed Dose Per Fraction: 3 Gy
Plan Total Fractions Prescribed: 10
Plan Total Prescribed Dose: 30 Gy
Reference Point Dosage Given to Date: 21 Gy
Reference Point Session Dosage Given: 3 Gy
Session Number: 7

## 2024-06-23 NOTE — Telephone Encounter (Signed)
 Left the patient a voicemail with the scheduled appointment details.

## 2024-06-24 ENCOUNTER — Telehealth: Payer: Self-pay

## 2024-06-24 ENCOUNTER — Other Ambulatory Visit: Payer: Self-pay

## 2024-06-24 ENCOUNTER — Inpatient Hospital Stay: Admitting: Dietician

## 2024-06-24 ENCOUNTER — Telehealth: Payer: Self-pay | Admitting: Dietician

## 2024-06-24 ENCOUNTER — Ambulatory Visit
Admission: RE | Admit: 2024-06-24 | Discharge: 2024-06-24 | Disposition: A | Discharge status: Home / Self Care | Source: Ambulatory Visit | Attending: Radiation Oncology | Admitting: Radiation Oncology

## 2024-06-24 DIAGNOSIS — Z87891 Personal history of nicotine dependence: Secondary | ICD-10-CM | POA: Diagnosis not present

## 2024-06-24 DIAGNOSIS — C3431 Malignant neoplasm of lower lobe, right bronchus or lung: Secondary | ICD-10-CM | POA: Diagnosis not present

## 2024-06-24 DIAGNOSIS — Z510 Encounter for antineoplastic radiation therapy: Secondary | ICD-10-CM | POA: Diagnosis not present

## 2024-06-24 LAB — RAD ONC ARIA SESSION SUMMARY
Course Elapsed Days: 9
Plan Fractions Treated to Date: 8
Plan Prescribed Dose Per Fraction: 3 Gy
Plan Total Fractions Prescribed: 10
Plan Total Prescribed Dose: 30 Gy
Reference Point Dosage Given to Date: 24 Gy
Reference Point Session Dosage Given: 3 Gy
Session Number: 8

## 2024-06-24 NOTE — Telephone Encounter (Signed)
 Copied from CRM [id number]. Topic: Medical Record Request - Other >> Jun 23, 2024  4:25 PM Leila BROCKS wrote: Reason for CRM: Patient [phone] states need the pathology report with codes of lung biopsy 05-07-24 from Dr. Shelah for [employer] purpose for cancer policy. Patient wants to pick it up from the office. Patient states she cannot find it on Mychart. Please advise and call back.  Tried calling the patient, no answer. Lvm to call back.  Patients biopsy results were positive and pt was referred to the cancer center. Pt needs to contact them for insurance billing regarding cancer policy.

## 2024-06-24 NOTE — Telephone Encounter (Signed)
 Spoke with the patient and informed patient to reach out to Cancer center for insurance questions regarding codes for cancer.  Nfn

## 2024-06-24 NOTE — Telephone Encounter (Signed)
Attempted to reach patient for a scheduled remote nutrition consult. Provided my cell# on voice mail to return call for her follow up nutrition consult.  Cyndi Helder Crisafulli, RDN, LDN Registered Dietitian, [facility] Part Time Remote (Usual office hours: Tuesday-Thursday) Cell: [phone]    

## 2024-06-25 ENCOUNTER — Other Ambulatory Visit: Payer: Self-pay

## 2024-06-25 ENCOUNTER — Ambulatory Visit
Admission: RE | Admit: 2024-06-25 | Discharge: 2024-06-25 | Disposition: A | Discharge status: Home / Self Care | Source: Ambulatory Visit | Attending: Radiation Oncology | Admitting: Radiation Oncology

## 2024-06-25 DIAGNOSIS — Z510 Encounter for antineoplastic radiation therapy: Secondary | ICD-10-CM | POA: Diagnosis not present

## 2024-06-25 DIAGNOSIS — Z87891 Personal history of nicotine dependence: Secondary | ICD-10-CM | POA: Diagnosis not present

## 2024-06-25 DIAGNOSIS — C3431 Malignant neoplasm of lower lobe, right bronchus or lung: Secondary | ICD-10-CM | POA: Diagnosis not present

## 2024-06-25 LAB — RAD ONC ARIA SESSION SUMMARY
Course Elapsed Days: 10
Plan Fractions Treated to Date: 9
Plan Prescribed Dose Per Fraction: 3 Gy
Plan Total Fractions Prescribed: 10
Plan Total Prescribed Dose: 30 Gy
Reference Point Dosage Given to Date: 27 Gy
Reference Point Session Dosage Given: 3 Gy
Session Number: 9

## 2024-06-26 ENCOUNTER — Emergency Department (HOSPITAL_COMMUNITY)

## 2024-06-26 ENCOUNTER — Encounter (HOSPITAL_COMMUNITY): Payer: Self-pay | Admitting: Internal Medicine

## 2024-06-26 ENCOUNTER — Other Ambulatory Visit: Payer: Self-pay

## 2024-06-26 ENCOUNTER — Inpatient Hospital Stay (HOSPITAL_COMMUNITY)
Admission: EM | Admit: 2024-06-26 | Discharge: 2024-07-09 | DRG: 951 | Disposition: E | Attending: Internal Medicine | Admitting: Internal Medicine

## 2024-06-26 DIAGNOSIS — R54 Age-related physical debility: Secondary | ICD-10-CM | POA: Diagnosis not present

## 2024-06-26 DIAGNOSIS — Z743 Need for continuous supervision: Secondary | ICD-10-CM | POA: Diagnosis not present

## 2024-06-26 DIAGNOSIS — E871 Hypo-osmolality and hyponatremia: Secondary | ICD-10-CM | POA: Diagnosis present

## 2024-06-26 DIAGNOSIS — Z7901 Long term (current) use of anticoagulants: Secondary | ICD-10-CM | POA: Diagnosis not present

## 2024-06-26 DIAGNOSIS — E785 Hyperlipidemia, unspecified: Secondary | ICD-10-CM

## 2024-06-26 DIAGNOSIS — C3400 Malignant neoplasm of unspecified main bronchus: Secondary | ICD-10-CM

## 2024-06-26 DIAGNOSIS — T751XXA Unspecified effects of drowning and nonfatal submersion, initial encounter: Secondary | ICD-10-CM | POA: Diagnosis not present

## 2024-06-26 DIAGNOSIS — R590 Localized enlarged lymph nodes: Secondary | ICD-10-CM | POA: Diagnosis not present

## 2024-06-26 DIAGNOSIS — R634 Abnormal weight loss: Secondary | ICD-10-CM | POA: Diagnosis not present

## 2024-06-26 DIAGNOSIS — E7800 Pure hypercholesterolemia, unspecified: Secondary | ICD-10-CM | POA: Diagnosis not present

## 2024-06-26 DIAGNOSIS — D638 Anemia in other chronic diseases classified elsewhere: Secondary | ICD-10-CM | POA: Diagnosis not present

## 2024-06-26 DIAGNOSIS — C3490 Malignant neoplasm of unspecified part of unspecified bronchus or lung: Secondary | ICD-10-CM | POA: Diagnosis not present

## 2024-06-26 DIAGNOSIS — Z7189 Other specified counseling: Secondary | ICD-10-CM

## 2024-06-26 DIAGNOSIS — R0689 Other abnormalities of breathing: Secondary | ICD-10-CM | POA: Diagnosis not present

## 2024-06-26 DIAGNOSIS — Z9981 Dependence on supplemental oxygen: Secondary | ICD-10-CM

## 2024-06-26 DIAGNOSIS — Z90710 Acquired absence of both cervix and uterus: Secondary | ICD-10-CM | POA: Diagnosis not present

## 2024-06-26 DIAGNOSIS — J439 Emphysema, unspecified: Secondary | ICD-10-CM | POA: Diagnosis not present

## 2024-06-26 DIAGNOSIS — I10 Essential (primary) hypertension: Secondary | ICD-10-CM | POA: Diagnosis present

## 2024-06-26 DIAGNOSIS — Z515 Encounter for palliative care: Principal | ICD-10-CM

## 2024-06-26 DIAGNOSIS — C7931 Secondary malignant neoplasm of brain: Secondary | ICD-10-CM | POA: Diagnosis not present

## 2024-06-26 DIAGNOSIS — R1310 Dysphagia, unspecified: Secondary | ICD-10-CM | POA: Diagnosis present

## 2024-06-26 DIAGNOSIS — Z923 Personal history of irradiation: Secondary | ICD-10-CM

## 2024-06-26 DIAGNOSIS — R0902 Hypoxemia: Secondary | ICD-10-CM | POA: Diagnosis not present

## 2024-06-26 DIAGNOSIS — R000 Tachycardia, unspecified: Secondary | ICD-10-CM | POA: Diagnosis not present

## 2024-06-26 DIAGNOSIS — Z87891 Personal history of nicotine dependence: Secondary | ICD-10-CM | POA: Diagnosis not present

## 2024-06-26 DIAGNOSIS — G8929 Other chronic pain: Secondary | ICD-10-CM | POA: Diagnosis present

## 2024-06-26 DIAGNOSIS — Z79899 Other long term (current) drug therapy: Secondary | ICD-10-CM

## 2024-06-26 DIAGNOSIS — R4589 Other symptoms and signs involving emotional state: Secondary | ICD-10-CM

## 2024-06-26 DIAGNOSIS — Z681 Body mass index (BMI) 19 or less, adult: Secondary | ICD-10-CM | POA: Diagnosis not present

## 2024-06-26 DIAGNOSIS — R0603 Acute respiratory distress: Secondary | ICD-10-CM | POA: Diagnosis not present

## 2024-06-26 DIAGNOSIS — Z86711 Personal history of pulmonary embolism: Secondary | ICD-10-CM | POA: Diagnosis not present

## 2024-06-26 DIAGNOSIS — R64 Cachexia: Secondary | ICD-10-CM | POA: Diagnosis not present

## 2024-06-26 DIAGNOSIS — F419 Anxiety disorder, unspecified: Secondary | ICD-10-CM | POA: Diagnosis present

## 2024-06-26 DIAGNOSIS — R0602 Shortness of breath: Secondary | ICD-10-CM

## 2024-06-26 DIAGNOSIS — J9621 Acute and chronic respiratory failure with hypoxia: Secondary | ICD-10-CM | POA: Diagnosis not present

## 2024-06-26 DIAGNOSIS — C3491 Malignant neoplasm of unspecified part of right bronchus or lung: Secondary | ICD-10-CM | POA: Diagnosis not present

## 2024-06-26 DIAGNOSIS — Z66 Do not resuscitate: Secondary | ICD-10-CM

## 2024-06-26 DIAGNOSIS — R069 Unspecified abnormalities of breathing: Secondary | ICD-10-CM | POA: Diagnosis not present

## 2024-06-26 LAB — COMPREHENSIVE METABOLIC PANEL WITH GFR
ALT: 15 U/L (ref 0–44)
AST: 21 U/L (ref 15–41)
Albumin: 2.7 g/dL — ABNORMAL LOW (ref 3.5–5.0)
Alkaline Phosphatase: 89 U/L (ref 38–126)
Anion gap: 14 (ref 5–15)
BUN: 15 mg/dL (ref 8–23)
CO2: 20 mmol/L — ABNORMAL LOW (ref 22–32)
Calcium: 9.6 mg/dL (ref 8.9–10.3)
Chloride: 100 mmol/L (ref 98–111)
Creatinine, Ser: 0.72 mg/dL (ref 0.44–1.00)
GFR, Estimated: >60 mL/min (ref >60)
Glucose, Bld: 198 mg/dL — ABNORMAL HIGH (ref 70–99)
Potassium: 3.7 mmol/L (ref 3.5–5.1)
Sodium: 134 mmol/L — ABNORMAL LOW (ref 135–145)
Total Bilirubin: 0.9 mg/dL (ref 0.0–1.2)
Total Protein: 7.2 g/dL (ref 6.5–8.1)

## 2024-06-26 LAB — CBC
HCT: 32 % — ABNORMAL LOW (ref 36.0–46.0)
Hemoglobin: 9.7 g/dL — ABNORMAL LOW (ref 12.0–15.0)
MCH: 25.4 pg — ABNORMAL LOW (ref 26.0–34.0)
MCHC: 30.3 g/dL (ref 30.0–36.0)
MCV: 83.8 fL (ref 80.0–100.0)
Platelets: 149 K/uL — ABNORMAL LOW (ref 150–400)
RBC: 3.82 MIL/uL — ABNORMAL LOW (ref 3.87–5.11)
RDW: 17.6 % — ABNORMAL HIGH (ref 11.5–15.5)
WBC: 7.9 K/uL (ref 4.0–10.5)
nRBC: 0 % (ref 0.0–0.2)

## 2024-06-26 LAB — I-STAT CHEM 8, ED
BUN: 16 mg/dL (ref 8–23)
Calcium, Ion: 1.26 mmol/L (ref 1.15–1.40)
Chloride: 102 mmol/L (ref 98–111)
Creatinine, Ser: 0.6 mg/dL (ref 0.44–1.00)
Glucose, Bld: 195 mg/dL — ABNORMAL HIGH (ref 70–99)
HCT: 29 % — ABNORMAL LOW (ref 36.0–46.0)
Hemoglobin: 9.9 g/dL — ABNORMAL LOW (ref 12.0–15.0)
Potassium: 3.8 mmol/L (ref 3.5–5.1)
Sodium: 132 mmol/L — ABNORMAL LOW (ref 135–145)
TCO2: 22 mmol/L (ref 22–32)

## 2024-06-26 LAB — BLOOD GAS, VENOUS
Acid-base deficit: 3.4 mmol/L — ABNORMAL HIGH (ref 0.0–2.0)
Bicarbonate: 21.3 mmol/L (ref 20.0–28.0)
O2 Saturation: 15.7 %
Patient temperature: 37
pCO2, Ven: 36 mmHg — ABNORMAL LOW (ref 44–60)
pH, Ven: 7.38 (ref 7.25–7.43)
pO2, Ven: <31 mmHg — CL (ref 32–45)

## 2024-06-26 LAB — TROPONIN I (HIGH SENSITIVITY): Troponin I (High Sensitivity): 302 ng/L (ref <18)

## 2024-06-26 MED ORDER — POLYVINYL ALCOHOL 1.4 % OP SOLN: 1.0000 [drp] | Freq: Four times a day (QID) | OPHTHALMIC | Status: DC | PRN

## 2024-06-26 MED ORDER — MORPHINE SULFATE (PF) 4 MG/ML IV SOLN
4.0000 mg | INTRAVENOUS | Status: DC | PRN
  Administered 2024-06-26 – 2024-06-27 (×3): 4 mg via INTRAVENOUS
  Filled 2024-06-26 (×3): qty 1

## 2024-06-26 MED ORDER — ONDANSETRON HCL 4 MG/2ML IJ SOLN
4.0000 mg | Freq: Four times a day (QID) | INTRAMUSCULAR | Status: DC | PRN
  Administered 2024-06-26: 4 mg via INTRAVENOUS
  Filled 2024-06-26: qty 2

## 2024-06-26 MED ORDER — LOPERAMIDE HCL 2 MG PO CAPS
2.0000 mg | ORAL_CAPSULE | ORAL | Status: DC | PRN
  Administered 2024-06-26: 2 mg via ORAL
  Filled 2024-06-26: qty 1

## 2024-06-26 MED ORDER — LORAZEPAM 2 MG/ML IJ SOLN
2.0000 mg | INTRAMUSCULAR | Status: DC | PRN
  Administered 2024-06-26: 2 mg via INTRAVENOUS
  Filled 2024-06-26: qty 1

## 2024-06-26 MED ORDER — LORAZEPAM 2 MG/ML IJ SOLN
1.0000 mg | INTRAMUSCULAR | Status: DC | PRN
  Administered 2024-06-27: 1 mg via INTRAVENOUS
  Filled 2024-06-26: qty 1

## 2024-06-26 MED ORDER — MORPHINE SULFATE 15 MG PO TABS: 7.5000 mg | ORAL_TABLET | ORAL | Status: DC | PRN

## 2024-06-26 MED ORDER — HALOPERIDOL LACTATE 5 MG/ML IJ SOLN: 1.0000 mg | Freq: Once | INTRAMUSCULAR | Status: AC

## 2024-06-26 MED ORDER — ONDANSETRON 4 MG PO TBDP
4.0000 mg | ORAL_TABLET | Freq: Four times a day (QID) | ORAL | Status: DC | PRN
  Administered 2024-06-27: 4 mg via ORAL
  Filled 2024-06-26 (×2): qty 1

## 2024-06-26 MED ORDER — SCOPOLAMINE 1 MG/3DAYS TD PT72: 1.0000 | MEDICATED_PATCH | TRANSDERMAL | Status: DC | PRN

## 2024-06-26 MED ORDER — OXYCODONE HCL 5 MG PO TABS: 5.0000 mg | ORAL_TABLET | ORAL | Status: DC | PRN

## 2024-06-26 MED ORDER — LORAZEPAM 1 MG PO TABS: 1.0000 mg | ORAL_TABLET | ORAL | Status: DC | PRN

## 2024-06-26 MED ORDER — ACETAMINOPHEN 325 MG PO TABS: 650.0000 mg | ORAL_TABLET | Freq: Four times a day (QID) | ORAL | Status: DC | PRN

## 2024-06-26 MED ORDER — BIOTENE DRY MOUTH MT LIQD: 15.0000 mL | OROMUCOSAL | Status: DC | PRN

## 2024-06-26 MED ORDER — LORAZEPAM 2 MG/ML PO CONC
1.0000 mg | ORAL | Status: DC | PRN
  Administered 2024-06-27: 1 mg via SUBLINGUAL
  Filled 2024-06-26: qty 0.5

## 2024-06-26 MED ORDER — ACETAMINOPHEN 650 MG RE SUPP: 650.0000 mg | Freq: Four times a day (QID) | RECTAL | Status: DC | PRN

## 2024-06-26 MED ORDER — ONDANSETRON HCL 4 MG/2ML IJ SOLN
INTRAMUSCULAR | Status: AC
  Filled 2024-06-26: qty 2

## 2024-06-26 MED ORDER — HALOPERIDOL LACTATE 5 MG/ML IJ SOLN
INTRAMUSCULAR | Status: AC
  Administered 2024-06-26: 1 mg via INTRAVENOUS
  Filled 2024-06-26: qty 1

## 2024-06-26 MED ORDER — MORPHINE SULFATE (PF) 4 MG/ML IV SOLN: 4.0000 mg | INTRAVENOUS | Status: DC | PRN

## 2024-06-26 MED ORDER — SENNA 8.6 MG PO TABS
1.0000 | ORAL_TABLET | Freq: Two times a day (BID) | ORAL | Status: DC
  Filled 2024-06-26: qty 1

## 2024-06-26 MED ORDER — ONDANSETRON HCL 4 MG/2ML IJ SOLN
4.0000 mg | Freq: Once | INTRAMUSCULAR | Status: AC
  Administered 2024-06-26: 4 mg via INTRAVENOUS

## 2024-06-26 NOTE — ED Triage Notes (Signed)
 Pt came in via EMS w/ c/o of SOB. Pt has cancer and received radiation today. Pt states she experiences SOB after radiation every time. Pt is tachypneic in triage.   EMS 125 solumedrol 0.5 Atrovent  2 g mag albuteral

## 2024-06-26 NOTE — ED Notes (Signed)
 Date and time results received: 06/26/24 4:53 AM  (use smartphrase .now to insert current time)  Test: PO2 Critical Value: <31  Name of Provider Notified: Palumbo  Orders Received? Or Actions Taken?: MAR

## 2024-06-26 NOTE — ED Notes (Addendum)
 Pt requested something for anxiety. Going to give ativan  per PRN meds.

## 2024-06-26 NOTE — Care Management (Signed)
 Patient seen by palliative, is requesting hospice at home, will need equipment at home. Patient will be admitted for management

## 2024-06-26 NOTE — ED Provider Notes (Signed)
 [place] EMERGENCY DEPARTMENT AT [facility] Provider Note   CSN: [id number] Arrival date & time: 06/26/24  0421     Patient presents with: Respiratory Distress   Kristina Howe is a 63 y.o. female.   The history is provided by the EMS personnel. The history is limited by the condition of the patient.  Shortness of Breath Severity:  Severe Timing:  Constant Progression:  Unchanged Chronicity:  Recurrent Context: not URI   Relieved by:  Nothing Worsened by:  Nothing Ineffective treatments:  None tried Associated symptoms: no fever and no vomiting   Risk factors: hx of cancer and hx of PE/DVT   Patient with stage four lung cancer with palliative radiation presents with SOB.  Patient was hypoxic to the 50% AT [facility].  Albuterol , solumedrol, magnesium  given en route.      Past Medical History:  Diagnosis Date   Anemia 04/02/2024   hgb = 9   Anxiety    tx w/ xanax  and celexa    Cancer (HCC)    Elevated cholesterol    Hypertension    Subclavian artery stenosis, right (HCC) 04/19/2024   right forearm pain;  thrombophlebitis of her superficial basilic forearm veins- instructed her to take ASA 81 mg & warm compresses     Prior to Admission medications   Medication Sig Start Date End Date Taking? Authorizing Provider  albuterol  (VENTOLIN  HFA) 108 (90 Base) MCG/ACT inhaler Inhale 2 puffs into the lungs every 6 (six) hours as needed for wheezing or shortness of breath. Patient taking differently: Inhale 2 puffs into the lungs every 4 (four) hours as needed for wheezing or shortness of breath. 05/18/24   Raenelle Coria, MD  ALPRAZolam  (XANAX ) 1 MG tablet Take 1 tablet (1 mg total) by mouth 3 (three) times daily as needed for anxiety. Patient taking differently: Take 1 mg by mouth 3 (three) times daily. 05/24/24   Haze Lonni PARAS, MD  atorvastatin  (LIPITOR) 40 MG tablet Take 1 tablet (40 mg total) by mouth daily. 03/12/24   Lue Elsie BROCKS, MD  enoxaparin   (LOVENOX ) 60 MG/0.6ML injection Inject 0.5 mLs (50 mg total) into the skin every 12 (twelve) hours. 05/18/24   Raenelle Coria, MD  ferrous sulfate  325 (65 FE) MG tablet Take 325 mg by mouth daily with breakfast.    [provider]  FLUoxetine  (PROZAC ) 20 MG capsule Take 20 mg by mouth daily.    [provider]  Multiple Vitamins-Minerals (MULTIVITAMIN WITH MINERALS) tablet Take 1 tablet by mouth daily with breakfast.    [provider]  oxyCODONE  (OXY IR/ROXICODONE ) 5 MG immediate release tablet Take 1 tablet (5 mg total) by mouth every 6 (six) hours as needed for severe pain (pain score 7-10). 05/18/24   Raenelle Coria, MD  triamterene -hydrochlorothiazide  (MAXZIDE -25) 37.5-25 MG per tablet Take 1 tablet by mouth daily.      [provider]  valsartan (DIOVAN) 320 MG tablet Take 320 mg by mouth daily. 12/31/23   [provider]    Allergies: Patient has no known allergies.    Review of Systems  Unable to perform ROS: Acuity of condition  Constitutional:  Negative for fever.  Respiratory:  Positive for shortness of breath.   Gastrointestinal:  Negative for vomiting.    Updated Vital Signs BP 103/75   Pulse (!) 121   Temp 98.3 F (36.8 C) (Axillary)   Resp (!) 30   SpO2 90%   Physical Exam Vitals and nursing note reviewed.  Constitutional:  General: She is in acute distress.     Appearance: She is cachectic. She is ill-appearing.  HENT:     Head: Normocephalic and atraumatic.     Nose: Nose normal.  Eyes:     Pupils: Pupils are equal, round, and reactive to light.  Cardiovascular:     Rate and Rhythm: Regular rhythm. Tachycardia present.     Pulses: Normal pulses.     Heart sounds: Normal heart sounds.  Pulmonary:     Effort: Pulmonary effort is normal. Tachypnea present. No respiratory distress.     Breath sounds: Normal breath sounds. Decreased air movement present.  Abdominal:     General: Bowel sounds are normal. There is no  distension.     Palpations: Abdomen is soft.     Tenderness: There is no abdominal tenderness. There is no guarding or rebound.  Musculoskeletal:        General: Normal range of motion.     Cervical back: Normal range of motion and neck supple.  Skin:    General: Skin is warm and dry.     Capillary Refill: Capillary refill takes less than 2 seconds.     Findings: No erythema or rash.  Neurological:     Mental Status: She is alert.     Deep Tendon Reflexes: Reflexes normal.  Psychiatric:        Mood and Affect: Mood normal.        Thought Content: Thought content normal.     (all labs ordered are listed, but only abnormal results are displayed) Results for orders placed or performed during the hospital encounter of 06/26/24  Blood gas, venous (at [facility] and [facility])   Collection Time: 06/26/24  4:30 AM  Result Value Ref Range   pH, Ven 7.38 7.25 - 7.43   pCO2, Ven 36 (L) 44 - 60 mmHg   pO2, Ven <31 (LL) 32 - 45 mmHg   Bicarbonate 21.3 20.0 - 28.0 mmol/L   Acid-base deficit 3.4 (H) 0.0 - 2.0 mmol/L   O2 Saturation 15.7 %   Patient temperature 37.0   CBC   Collection Time: 06/26/24  4:36 AM  Result Value Ref Range   WBC 7.9 4.0 - 10.5 K/uL   RBC 3.82 (L) 3.87 - 5.11 MIL/uL   Hemoglobin 9.7 (L) 12.0 - 15.0 g/dL   HCT 67.9 (L) 63.9 - 53.9 %   MCV 83.8 80.0 - 100.0 fL   MCH 25.4 (L) 26.0 - 34.0 pg   MCHC 30.3 30.0 - 36.0 g/dL   RDW 82.3 (H) 88.4 - 84.4 %   Platelets 149 (L) 150 - 400 K/uL   nRBC 0.0 0.0 - 0.2 %  I-stat chem 8, ED (not at [facility], [facility] or [facility])   Collection Time: 06/26/24  5:07 AM  Result Value Ref Range   Sodium 132 (L) 135 - 145 mmol/L   Potassium 3.8 3.5 - 5.1 mmol/L   Chloride 102 98 - 111 mmol/L   BUN 16 8 - 23 mg/dL   Creatinine, Ser 9.39 0.44 - 1.00 mg/dL   Glucose, Bld 804 (H) 70 - 99 mg/dL   Calcium , Ion 1.26 1.15 - 1.40 mmol/L   TCO2 22 22 - 32 mmol/L   Hemoglobin 9.9 (L) 12.0 - 15.0 g/dL   HCT 70.9 (L) 63.9 - 53.9 %   DG Chest Port 1 View Result Date:  06/26/2024 CLINICAL DATA:  63 year old female with lung cancer undergoing treatment. Shortness of breath, respiratory distress. EXAM: PORTABLE CHEST  1 VIEW COMPARISON:  CTA chest 06/14/2024 and earlier. FINDINGS: Portable AP semi upright view at 0445 hours. Opacified right lung base related to obstructive tumor and drowned lung appears stable since the 06/14/2024 CTA. Abnormal mediastinal contour with bulky superior mediastinal lymphadenopathy and ex nodal disease also appears stable from the scout view at that time. Right superior mediastinal vascular stents are again noted. Stable trachea. No superimposed pneumothorax, pulmonary edema or new lung opacity identified. No left pleural effusion. Paucity of bowel gas. Stable visualized osseous structures. IMPRESSION: 1. Stable malignant appearance of the right lung and mediastinum since 06/14/2024 CTA chest. 2. No new cardiopulmonary abnormality identified. Electronically Signed   By: VEAR Hurst M.D.   On: 06/26/2024 05:00   CT Angio Chest PE W and/or Wo Contrast Result Date: 06/14/2024 CLINICAL DATA:  Concern for pulmonary embolism.  Lung cancer. EXAM: CT ANGIOGRAPHY CHEST WITH CONTRAST TECHNIQUE: Multidetector CT imaging of the chest was performed using the standard protocol during bolus administration of intravenous contrast. Multiplanar CT image reconstructions and MIPs were obtained to evaluate the vascular anatomy. RADIATION DOSE REDUCTION: This exam was performed according to the departmental dose-optimization program which includes automated exposure control, adjustment of the mA and/or kV according to patient size and/or use of iterative reconstruction technique. CONTRAST:  75mL OMNIPAQUE  IOHEXOL  350 MG/ML SOLN COMPARISON:  CT dated 05/24/2024. FINDINGS: Cardiovascular: There is no cardiomegaly. Interval increase in the size of the pericardial effusion measuring approximately 1 cm in thickness anterior to the heart. There is mild atherosclerotic calcification  of the thoracic aorta. No aneurysmal dilatation or dissection. Stents in the proximal right common carotid and subclavian arteries noted. The origins of the great vessels of the aortic arch are patent. Small nonocclusive right upper lobe segmental pulmonary artery embolus similar to prior CT. No new pulmonary embolus. No evidence of right heart straining. Mediastinum/Nodes: Bulky hilar and mediastinal adenopathy progressed since the prior CT. Bilateral supraclavicular adenopathy noted. There is mass effect and compression of the midportion of the esophagus by the mediastinal adenopathy. No mediastinal fluid collection. Lungs/Pleura: There is complete occlusion of the bronchus intermedius and right middle and right lower lobe bronchi with complete collapse of the right middle and right lower lobes. This is likely related to tumor invasion. There is a small right pleural effusion. There is background of emphysema. Linear scarring in the left apex. No pneumothorax. Upper Abdomen: No acute abnormality. Musculoskeletal: Osteopenia.  No acute osseous pathology. Review of the MIP images confirms the above findings. IMPRESSION: 1. Small nonocclusive right upper lobe segmental pulmonary artery embolus similar to prior CT. No new pulmonary embolus. No evidence of right heart straining. 2. Complete occlusion of the bronchus intermedius and right middle and right lower lobe bronchi with complete collapse of the right middle and right lower lobes. 3. Small right pleural effusion. 4. Bulky hilar and mediastinal adenopathy progressed since the prior CT. 5. Aortic Atherosclerosis (ICD10-I70.0) and Emphysema (ICD10-J43.9). Electronically Signed   By: Vanetta Chou M.D.   On: 06/14/2024 11:55   CT Head Wo Contrast Result Date: 06/14/2024 CLINICAL DATA:  Mental status change, unknown cause EXAM: CT HEAD WITHOUT CONTRAST TECHNIQUE: Contiguous axial images were obtained from the base of the skull through the vertex without  intravenous contrast. RADIATION DOSE REDUCTION: This exam was performed according to the departmental dose-optimization program which includes automated exposure control, adjustment of the mA and/or kV according to patient size and/or use of iterative reconstruction technique. COMPARISON:  MRI of the head dated May 26, 2024. FINDINGS: Brain: Age-related cerebral volume loss. Area of diminished attenuation within the left posterior temporal lobe corresponding with the ring-enhancing lesion noted on the previous MRI. There is also moderate subcortical and deep cerebral white matter disease. No discrete lesions are evident within the cerebellar hemispheres. Vascular: Negative. Skull: Unremarkable. Sinuses/Orbits: Normal. Other: None. IMPRESSION: 1. Focal area of diminished attenuation at the left temporal occipital junction corresponding with the likely metastatic lesion seen on previous MRI. The other lesions are not evident, but the current study is limited by the absence of intravenous contrast. 2. Moderate cerebral white matter disease. Electronically Signed   By: Evalene Coho M.D.   On: 06/14/2024 11:54   DG Chest Portable 1 View Result Date: 06/14/2024 CLINICAL DATA:  Shortness of breath, known stage IV lung cancer and COPD EXAM: PORTABLE CHEST 1 VIEW COMPARISON:  Prior chest x-ray 06/01/2024 FINDINGS: Unchanged contour of the mediastinum consistent with bulky mediastinal lymphadenopathy. Chronic bronchitic changes are similar compared to prior. Probable right middle and right lower lobe atelectasis is similar compared to prior. No new effusion or pneumothorax. No acute osseous abnormality. IMPRESSION: Stable appearance of the chest without significant interval change compared to 06/01/2024. Right middle and right lower lobe atelectasis with bulky mediastinal adenopathy. Electronically Signed   By: Wilkie Lent M.D.   On: 06/14/2024 08:15   DG Chest Portable 1 View Result Date: 06/01/2024 CLINICAL  DATA:  Shortness of breath EXAM: PORTABLE CHEST 1 VIEW COMPARISON:  05/23/2024 FINDINGS: Opacity in the right lower hemithorax could reflect a combination of airspace disease and effusion. Left lung clear. Heart is normal size. Again noted is bulky mediastinal adenopathy as seen on prior CT. No acute bony abnormality. IMPRESSION: New opacification in the right lower chest may reflect a combination of effusion and airspace disease/collapse. Bulky mediastinal adenopathy. Electronically Signed   By: Franky Crease M.D.   On: 06/01/2024 16:17    Radiology: [facility] Chest Port 1 View Result Date: 06/26/2024 CLINICAL DATA:  63 year old female with lung cancer undergoing treatment. Shortness of breath, respiratory distress. EXAM: PORTABLE CHEST 1 VIEW COMPARISON:  CTA chest 06/14/2024 and earlier. FINDINGS: Portable AP semi upright view at 0445 hours. Opacified right lung base related to obstructive tumor and drowned lung appears stable since the 06/14/2024 CTA. Abnormal mediastinal contour with bulky superior mediastinal lymphadenopathy and ex nodal disease also appears stable from the scout view at that time. Right superior mediastinal vascular stents are again noted. Stable trachea. No superimposed pneumothorax, pulmonary edema or new lung opacity identified. No left pleural effusion. Paucity of bowel gas. Stable visualized osseous structures. IMPRESSION: 1. Stable malignant appearance of the right lung and mediastinum since 06/14/2024 CTA chest. 2. No new cardiopulmonary abnormality identified. Electronically Signed   By: VEAR Hurst M.D.   On: 06/26/2024 05:00     .Critical Care  Performed by: Nettie Earing, MD Authorized by: Nettie Earing, MD   Critical care provider statement:    Critical care time (minutes):  60   Critical care end time:  06/26/2024 5:45 AM   Critical care was necessary to treat or prevent imminent or life-threatening deterioration of the following conditions:  Respiratory failure   Critical  care was time spent personally by me on the following activities:  Development of treatment plan with patient or surrogate, discussions with consultants, evaluation of patient's response to treatment, examination of patient, ordering and review of laboratory studies, ordering and review of radiographic studies, ordering and performing treatments and interventions, pulse oximetry,  re-evaluation of patient's condition and review of old charts   I assumed direction of critical care for this patient from another provider in my specialty: no     Care discussed with: admitting provider      Medications Ordered in the ED  ondansetron  (ZOFRAN ) 4 MG/2ML injection (  Not Given 06/26/24 0432)  haloperidol  lactate (HALDOL ) injection 1 mg (1 mg Intravenous Given 06/26/24 0430)  ondansetron  (ZOFRAN ) injection 4 mg (4 mg Intravenous Given 06/26/24 0431)                                    Medical Decision Making Called out for hypoxia and SOB,  steroids and magnesium  given    Amount and/or Complexity of Data Reviewed Independent Historian: EMS    Details: Boyfriend and sister  External Data Reviewed: labs, radiology and notes.    Details: Previous visits reviewed  Labs: ordered.    Details: On VBG hypoxia 31 normal white count 7.9, slight 9.7, normal platelets.  Sodium slight low 132, normal potassium 3.8, normal creatinine  Radiology: ordered and independent interpretation performed.    Details: Mass in the RLL by me  ECG/medicine tests: ordered and independent interpretation performed. Decision-making details documented in ED Course. Discussion of management or test interpretation with external provider(s): Seen by Dr. Dub of PCCM and made DNR DNI and decision made to initiate palliative care.    Risk Prescription drug management. Decision regarding hospitalization. Risk Details: Patient is now DNR DNI will admit to palliative care     EKG Interpretation Date/Time:  Saturday June 26 2024  04:31:18 EDT Ventricular Rate:  126 PR Interval:  148 QRS Duration:  95 QT Interval:  305 QTC Calculation: 442 R Axis:   -63  Text Interpretation: Sinus tachycardia Left anterior fascicular block Probable anteroseptal infarct, old Confirmed by Nettie, Verdelle Valtierra ([id number]) on 06/26/2024 6:14:40 AM          Final diagnoses:  Primary malignant neoplasm of lung metastatic to other site, unspecified laterality (HCC)  Acute on chronic respiratory failure with hypoxia (HCC)   The patient appears reasonably stabilized for admission considering the current resources, flow, and capabilities available in the ED at this time, and I doubt any other [facility] requiring further screening and/or treatment in the ED prior to admission.  ED Discharge Orders     None          Axil Copeman, MD 06/26/24 [id number]

## 2024-06-26 NOTE — H&P (Signed)
 History and Physical  FRANK PILGER FMW:[id number] DOB: 1961-05-27 DOA: 06/26/2024  PCP: [name] BROCKS, FNP   Chief Complaint: shortness of breath   HPI: Kristina Howe is a 63 y.o. 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  She lives with her long-term boyfriend, for the last week or so she has been having severe worsening dyspnea, shortness of breath, she has lost about 50 pounds, has been on 3 L nasal cannula oxygen for the last month and despite this when she ambulates even a few steps to the bathroom with assistance, her O2 saturation has been seen to drop into the low 80s.  After radiation yesterday, she had severe shortness of breath which does happen commonly for her.  After workup and evaluation in the emergency department, as well as being seen in consultation by pulmonary critical care, patient and family have decided to pursue comfort care.  She prefers home hospice.  Review of Systems: Please see HPI for pertinent positives and negatives. A complete 10 system review of systems are otherwise negative.  Past Medical History:  Diagnosis Date   Anemia 04/02/2024   hgb = 9   Anxiety    tx w/ xanax  and celexa    Cancer (HCC)    Elevated cholesterol    Hypertension    Subclavian artery stenosis, right (HCC) 04/19/2024   right forearm pain;  thrombophlebitis of her superficial basilic forearm veins- instructed her to take ASA 81 mg & warm compresses   Past Surgical History:  Procedure Laterality Date   ABDOMINAL HYSTERECTOMY     AORTOGRAM N/A 03/11/2024   Procedure: AORTIC ARCH ANGIOGRAM;  Surgeon: Pearline Norman RAMAN, MD;  Location: [facility] OR;  Service: Vascular;  Laterality: N/A;   BRONCHIAL BIOPSY  04/26/2024   Procedure: BRONCHOSCOPY, WITH  BIOPSY;  Surgeon: Shelah Lamar RAMAN, MD;  Location: [facility] ENDOSCOPY;  Service: Pulmonary;;   BRONCHIAL BRUSHINGS  04/26/2024   Procedure: BRONCHOSCOPY, WITH BRUSH BIOPSY;  Surgeon: Shelah Lamar RAMAN, MD;  Location: [facility]OSCOPY;  Service: Pulmonary;;   BRONCHIAL NEEDLE ASPIRATION BIOPSY  04/26/2024   Procedure: BRONCHOSCOPY, WITH NEEDLE ASPIRATION BIOPSY;  Surgeon: Shelah Lamar RAMAN, MD;  Location: [facility] ENDOSCOPY;  Service: Pulmonary;;   BRONCHIAL WASHINGS  04/26/2024   Procedure: IRRIGATION, BRONCHUS;  Surgeon: Shelah Lamar RAMAN, MD;  Location: [facility] ENDOSCOPY;  Service: Pulmonary;;   BRONCHOSCOPY, WITH BIOPSY USING ELECTROMAGNETIC NAVIGATION Right 04/26/2024   Procedure: BRONCHOSCOPY, WITH BIOPSY USING ELECTROMAGNETIC NAVIGATION;  Surgeon: Shelah Lamar RAMAN, MD;  Location: [facility] ENDOSCOPY;  Service: Pulmonary;  Laterality: Right;   COLONOSCOPY  2024   INSERTION OF RETROGRADE CAROTID STENT Right 03/11/2024   Procedure: INSERTION RIGHT SUBCLAVIAN ARTERY AND RIGHT COMMON CAROTID ARTERY STENTS;  Surgeon: Pearline Norman RAMAN, MD;  Location: [facility] OR;  Service: Vascular;  Laterality: Right;   THORACIC EXPOSURE Right 03/11/2024   Procedure: DIRECT EXPOSURE OF RIGHT COMMON CAROTID ARTERY;  Surgeon: Pearline Norman RAMAN, MD;  Location: [facility] OR;  Service: Vascular;  Laterality: Right;   ULTRASOUND GUIDANCE FOR VASCULAR ACCESS Right 03/11/2024   Procedure: ULTRASOUND GUIDANCE, FOR VASCULAR ACCESS, RIGHT RADIAL ARTERY AND RIGHT FEMORAL ARTERY;  Surgeon: Pearline Norman RAMAN, MD;  Location: [facility] OR;  Service: Vascular;  Laterality: Right;   UPPER EXTREMITY ANGIOGRAM Right 03/11/2024   Procedure: GERALYN, RIGHT UPPER EXTREMITY;  Surgeon: Pearline Norman  S, MD;  Location: [facility] OR;  Service: Vascular;  Laterality: Right;   UPPER GI ENDOSCOPY  2022   VIDEO BRONCHOSCOPY WITH ENDOBRONCHIAL ULTRASOUND Right 04/26/2024   Procedure: BRONCHOSCOPY, WITH EBUS;  Surgeon: Shelah Lamar RAMAN, MD;  Location: [facility] ENDOSCOPY;  Service: Pulmonary;  Laterality: Right;   Possible robotic navigation as well depending on CT results   Social History:  reports that she quit smoking about 30 years ago. Her smoking use included cigarettes. She started smoking about 3 months ago. She has never been exposed to tobacco smoke. She has never used smokeless tobacco. She reports that she does not currently use alcohol . She reports that she does not use drugs.  No Known Allergies  No family history on file.   Prior to Admission medications   Medication Sig Start Date End Date Taking? Authorizing Provider  albuterol  (VENTOLIN  HFA) 108 (90 Base) MCG/ACT inhaler Inhale 2 puffs into the lungs every 6 (six) hours as needed for wheezing or shortness of breath. Patient taking differently: Inhale 2 puffs into the lungs every 4 (four) hours as needed for wheezing or shortness of breath. 05/18/24   Raenelle Coria, MD  ALPRAZolam  (XANAX ) 1 MG tablet Take 1 tablet (1 mg total) by mouth 3 (three) times daily as needed for anxiety. Patient taking differently: Take 1 mg by mouth 3 (three) times daily. 05/24/24   Haze Lonni PARAS, MD  atorvastatin  (LIPITOR) 40 MG tablet Take 1 tablet (40 mg total) by mouth daily. 03/12/24   Lue Elsie BROCKS, MD  enoxaparin  (LOVENOX ) 60 MG/0.6ML injection Inject 0.5 mLs (50 mg total) into the skin every 12 (twelve) hours. 05/18/24   Raenelle Coria, MD  ferrous sulfate  325 (65 FE) MG tablet Take 325 mg by mouth daily with breakfast.    [provider]  FLUoxetine  (PROZAC ) 20 MG capsule Take 20 mg by mouth daily.    [provider]  Multiple Vitamins-Minerals (MULTIVITAMIN WITH MINERALS) tablet Take 1 tablet by mouth daily with breakfast.    [provider]  oxyCODONE  (OXY IR/ROXICODONE ) 5 MG immediate release tablet Take 1 tablet (5 mg total) by mouth every 6 (six) hours as needed for severe pain (pain score 7-10). 05/18/24   Raenelle Coria, MD  triamterene -hydrochlorothiazide  (MAXZIDE -25) 37.5-25 MG per tablet Take 1 tablet by  mouth daily.      [provider]  valsartan (DIOVAN) 320 MG tablet Take 320 mg by mouth daily. 12/31/23   [provider]    Physical Exam: BP 122/84 (BP Location: Left Arm)   Pulse (!) 111   Temp 98.3 F (36.8 C) (Axillary)   Resp 17   SpO2 94%  General:  Alert, oriented, calm, in no acute distress, thin female appearing older than her stated age, wearing nonrebreather mask.  Her sister and long-term boyfriend are at the bedside. Cardiovascular: RRR, no murmurs or rubs, no peripheral edema  Respiratory: clear to auscultation bilaterally, no wheezes, no crackles  Abdomen: soft, nontender, nondistended, normal bowel tones heard  Skin: dry, no rashes  Musculoskeletal: no joint effusions, normal range of motion  Psychiatric: appropriate affect, normal speech  Neurologic: extraocular muscles intact, clear speech, moving all extremities with intact sensorium         Labs on Admission:  Basic Metabolic Panel: Recent Labs  Lab 06/22/24 1244 06/26/24 0436 06/26/24 0507  NA 132* 134* 132*  K 4.0 3.7 3.8  CL 97* 100 102  CO2 26 20*  --   GLUCOSE 112* 198* 195*  BUN 16 15 16   CREATININE 0.60 0.72 0.60  CALCIUM  9.7 9.6  --    Liver Function Tests: Recent Labs  Lab 06/22/24 1244 06/26/24 0436  AST 14* 21  ALT 7 15  ALKPHOS 88 89  BILITOT 0.7 0.9  PROT 7.3 7.2  ALBUMIN  3.2* 2.7*   No results for input(s): LIPASE, AMYLASE in the last 168 hours. No results for input(s): AMMONIA in the last 168 hours. CBC: Recent Labs  Lab 06/22/24 1244 06/26/24 0436 06/26/24 0507  WBC 6.2 7.9  --   NEUTROABS 5.1  --   --   HGB 9.8* 9.7* 9.9*  HCT 31.0* 32.0* 29.0*  MCV 79.9* 83.8  --   PLT 204 149*  --    Cardiac Enzymes: No results for input(s): CKTOTAL, CKMB, CKMBINDEX, TROPONINI in the last 168 hours. BNP (last 3 results) Recent Labs    05/17/24 1415 05/20/24 1632  BNP 42.3 33.8    ProBNP (last 3 results) No results for input(s): PROBNP  in the last 8760 hours.  CBG: No results for input(s): GLUCAP in the last 168 hours.  Radiological Exams on Admission: DG Chest Port 1 View Result Date: 06/26/2024 CLINICAL DATA:  63 year old female with lung cancer undergoing treatment. Shortness of breath, respiratory distress. EXAM: PORTABLE CHEST 1 VIEW COMPARISON:  CTA chest 06/14/2024 and earlier. FINDINGS: Portable AP semi upright view at 0445 hours. Opacified right lung base related to obstructive tumor and drowned lung appears stable since the 06/14/2024 CTA. Abnormal mediastinal contour with bulky superior mediastinal lymphadenopathy and ex nodal disease also appears stable from the scout view at that time. Right superior mediastinal vascular stents are again noted. Stable trachea. No superimposed pneumothorax, pulmonary edema or new lung opacity identified. No left pleural effusion. Paucity of bowel gas. Stable visualized osseous structures. IMPRESSION: 1. Stable malignant appearance of the right lung and mediastinum since 06/14/2024 CTA chest. 2. No new cardiopulmonary abnormality identified. Electronically Signed   By: VEAR Hurst M.D.   On: 06/26/2024 05:00   Assessment/Plan NAUREEN BENTON is a 63 y.o. 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  After stabilization in the ER and discussion with ER provider, PCCM, patient and her family have decided to pursue hospice and comfort care.  Acute on chronic hypoxic respiratory failure-due to progressive non-small cell lung cancer -Inpatient admission -Continue oxygen support  Metastatic and progressive non-small cell lung cancer-patient and family have decided to pursue comfort care -Comfort care order set utilized -Regular diet -Discontinue home  monitoring, vital signs, and labs -Palliative care consulted, anticipate arrangement of home hospice  History of PE-patient on Lovenox  at home, discussed and clarified with her that this will be discontinued  DVT prophylaxis: None    Code Status: Do not attempt resuscitation (DNR) - Comfort care  Consults called: PCCM  Admission status: The appropriate patient status for this patient is INPATIENT. Inpatient status is judged to be reasonable and necessary in order to provide the required intensity of service to ensure the patient's safety. The patient's presenting symptoms, physical exam findings, and initial radiographic and laboratory data in the context of their chronic comorbidities is felt to place them at high risk for further clinical deterioration. Furthermore, it is not anticipated that the patient will be medically stable for discharge from the hospital within 2  midnights of admission.    I certify that at the point of admission it is my clinical judgment that the patient will require inpatient hospital care spanning beyond 2 midnights from the point of admission due to high intensity of service, high risk for further deterioration and high frequency of surveillance required  Time spent: 59 minutes  Yannet Rincon CHRISTELLA Gail MD [facility] Pager [phone]  If 7PM-7AM, please contact night-coverage [email or web address] Password [email or web address]  06/26/2024, 8:16 AM

## 2024-06-26 NOTE — Consult Note (Signed)
 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  Upon presentation discussion with PCCM provider and hospitalist in ER, patient has elected to transition to full comfort focused measures with hope of getting home with hospice support.  Patient admitted to assist with coordination of care to make sure patient has equipment and hospice coordination upon discharge.   palliative medicine team consulted to assist with complex medical decision making and symptom management. Of note patient seen by palliative medicine team during prior hospitalization.  Reviewed EMR prior to presenting to bedside including recent documentation from [facility] provider and hospitalist.  Reviewed recent BMP noting BUN 16, creatinine 0.6, and so GFR estimated to be over 60.  Personally reviewed recent chest x-ray noting malignancy in right lung. At time of EMR review, patient had received as needed IV Ativan  1 mg x 1 dose.  Had not received any opioids to assist with shortness of breath management.  Presented to bedside to see patient in ER.  Patient laying in bed on nonrebreather support.  Able to introduce myself as a member of the palliative medicine team.  Patient able to introduce her sister and long-term boyfriend of over 20 years and give permission to engage in conversation.  Again introduced myself as a  member of the palliative medicine team my role in patient's medical journey.  Discussed patient has already made transition to comfort focused care.  Patient hoping she can return home with hospice support.  Sister and boyfriend planning on providing care at home.  Sister and brother also able to provide input about patient's care with patient's shortness of breath.  Noted have consulted TOC to assist with home hospice referral.  Inquired about patient's symptom management at that time.  Patient describes issues with shortness of breath and anxiety.  Patient does describe that becoming short of breath makes her anxiety worse.  Patient had recently received as needed Ativan  1 mg which she felt did assist with anxiety management.  Noted would continue this medication and patient does have oral doses of medication she can take which she could go home with. Inquired if patient had ever received opioids to assist with her shortness of breath management which she does not feel she has.  Discussed how opioids can assist with shortness of breath management and pain management.  Noted would start low-dose morphine  to assist with this.  Patient denied having difficulties with taking oral tablets so noted would start tablet morphine  with IV morphine  as needed for breakthrough.  Patient agreeing with this plan.  ------------------------------------------------------------------------------------------------------------- Advance Care Planning Conversation  Pertinent diagnosis: Metastatic non-small cell lung cancer with disease to brain, frailty, emphysema, history of PE  The patient and/or family consented to a voluntary Advance Care Planning Conversation in person. Individuals present for the conversation: Patient, patient's sister, patient's significant other- Kristina all present in person to discuss care with this provider  Summary of the conversation:  Able to inquire about ACP planning with patient.  Patient  denies having HCPOA.  Inquired if patient was unable to make medical decisions for herself, who would she want to make medical decisions on her behalf.  Patient noted she would want her long-term boyfriend to make medical decisions for her.  Patient stated that she does have an adult son who is over we are 54 years old.  Discussed importance of completing ACP documentation naming her boyfriend's HCPOA if she would like him to be her medical decision-maker if she is unable to make medical decisions on her behalf.  Noted will consult chaplain to assist with this.  Patient agreeing with this plan.  Outcome of the conversations and/or documents completed:  Have consulted chaplain to assist with ACP documentation completion.  Patient wants her significant other, Kristina, to be her HCPOA.  Patient does have an adult son so noted importance of HCPOA and ACP documentation.  I spent 20 minutes providing separately identifiable ACP services with the patient and/or surrogate decision maker in a voluntary, in-person conversation discussing the patient's wishes and goals as detailed in the above note.  Tinnie Radar, DO Palliative Medicine Provider  -------------------------------------------------------------------------------------------------------------   Spent time answering questions as able.  Noted palliative medicine team continue to follow with patient's medical journey.  Updated care team including hospitalist, TOC, and RN regarding discussion and recommendations.  Primary Diagnoses  Present on Admission:  Lung cancer [facility])   Palliative Review of Systems: Shortness of breath, anxiety  Past Medical History:  Diagnosis Date   Anemia 04/02/2024   hgb = 9   Anxiety    tx w/ xanax  and celexa    Cancer (HCC)    Elevated cholesterol    Hypertension    Subclavian artery stenosis, right (HCC) 04/19/2024   right forearm pain;  thrombophlebitis of her superficial basilic forearm veins- instructed her  to take ASA 81 mg & warm compresses   Social History   Socioeconomic History   Marital status: Significant Other    Spouse name: Not on file   Number of children: Not on file   Years of education: Not on file   Highest education level: Not on file  Occupational History   Not on file  Tobacco Use   Smoking status: Former    Types: Cigarettes    Start date: 03/18/2024    Quit date: 03/18/1994    Years since quitting: 30.2    Passive exposure: Never   Smokeless tobacco: Never   Tobacco comments:    Quit smoking 03/18/2024   Vaping Use   Vaping status: Never Used  Substance and Sexual Activity   Alcohol  use: Not Currently   Drug use: No   Sexual activity: Not Currently    Birth control/protection: Surgical    Comment: Hysterectomy  Other Topics Concern   Not on file  Social History Narrative   Not on file   Social Drivers of Health   Financial Resource Strain: Not on file  Food Insecurity: No Food Insecurity (06/14/2024)   Hunger Vital Sign    Worried About Running Out of Food in the Last Year: Never true    Ran Out of Food in the Last Year: Never true  Transportation Needs: No Transportation Needs (06/14/2024)   PRAPARE - Administrator, Civil Service (Medical): No    Lack of Transportation (Non-Medical): No  Physical Activity: Not on file  Stress: Not on file  Social Connections: Not on file  No family history on file. Scheduled Meds:  ondansetron        Continuous Infusions: PRN Meds:.acetaminophen  **OR** acetaminophen , antiseptic oral rinse, artificial tears, loperamide , LORazepam  **OR** LORazepam , LORazepam , morphine  injection, ondansetron , ondansetron  **OR** ondansetron  (ZOFRAN ) IV, oxyCODONE , scopolamine  No Known Allergies CBC:    Component Value Date/Time   WBC 7.9 06/26/2024 0436   HGB 9.9 (L) 06/26/2024 0507   HGB 9.8 (L) 06/22/2024 1244   HCT 29.0 (L) 06/26/2024 0507   PLT 149 (L) 06/26/2024 0436   PLT 204 06/22/2024 1244   MCV 83.8  06/26/2024 0436   NEUTROABS 5.1 06/22/2024 1244   LYMPHSABS 0.3 (L) 06/22/2024 1244   MONOABS 0.6 06/22/2024 1244   EOSABS 0.1 06/22/2024 1244   BASOSABS 0.1 06/22/2024 1244   Comprehensive Metabolic Panel:    Component Value Date/Time   NA 132 (L) 06/26/2024 0507   K 3.8 06/26/2024 0507   CL 102 06/26/2024 0507   CO2 20 (L) 06/26/2024 0436   BUN 16 06/26/2024 0507   CREATININE 0.60 06/26/2024 0507   CREATININE 0.60 06/22/2024 1244   GLUCOSE 195 (H) 06/26/2024 0507   CALCIUM  9.6 06/26/2024 0436   AST 21 06/26/2024 0436   AST 14 (L) 06/22/2024 1244   ALT 15 06/26/2024 0436   ALT 7 06/22/2024 1244   ALKPHOS 89 06/26/2024 0436   BILITOT 0.9 06/26/2024 0436   BILITOT 0.7 06/22/2024 1244   PROT 7.2 06/26/2024 0436   ALBUMIN  2.7 (L) 06/26/2024 0436    Physical Exam: Vital Signs: BP 122/84 (BP Location: Left Arm)   Pulse (!) 111   Temp 98.2 F (36.8 C) (Oral)   Resp 17   SpO2 94%  SpO2: SpO2: 94 % O2 Device: O2 Device: NRB O2 Flow Rate: O2 Flow Rate (L/min): 15 L/min Intake/output summary: No intake or output data in the 24 hours ending 06/26/24 0831 LBM:   Baseline Weight:   Most recent weight:    General: Chronically ill-appearing, cachectic, frail Cardiovascular: Tachycardia noted Respiratory: increased work of breathing noted Neuro: A&Ox4, following commands easily Psych: appropriately answers all questions          Palliative Performance Scale: 30%              Additional Data Reviewed: Recent Labs    06/26/24 0436 06/26/24 0507  WBC 7.9  --   HGB 9.7* 9.9*  PLT 149*  --   NA 134* 132*  BUN 15 16  CREATININE 0.72 0.60    Imaging: DG Chest Port 1 View CLINICAL DATA:  64 year old female with lung cancer undergoing treatment. Shortness of breath, respiratory distress.  EXAM: PORTABLE CHEST 1 VIEW  COMPARISON:  CTA chest 06/14/2024 and earlier.  FINDINGS: Portable AP semi upright view at 0445 hours. Opacified right lung base related to  obstructive tumor and drowned lung appears stable since the 06/14/2024 CTA. Abnormal mediastinal contour with bulky superior mediastinal lymphadenopathy and ex nodal disease also appears stable from the scout view at that time. Right superior mediastinal vascular stents are again noted. Stable trachea. No superimposed pneumothorax, pulmonary edema or new lung opacity identified. No left pleural effusion. Paucity of bowel gas. Stable visualized osseous structures.  IMPRESSION: 1. Stable malignant appearance of the right lung and mediastinum since 06/14/2024 CTA chest. 2. No new cardiopulmonary abnormality identified.  Electronically Signed   By: VEAR Hurst M.D.   On: 06/26/2024 05:00    I personally reviewed recent imaging.   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  Upon presentation discussion with PCCM provider and hospitalist in ER, patient has elected to transition to full comfort focused measures with hope of getting home with hospice support.  Patient admitted to assist with coordination of care to make sure patient has equipment and hospice coordination upon discharge.   palliative medicine team consulted to assist with complex medical decision making and symptom management. Of note patient seen by palliative medicine team during prior hospitalization.  # Complex medical decision making/goals of care  - Patient had already discussed complex medical decision making with hospitalist and PCCM provider.  Patient electing to focus on comfort focused care at this time with plan to go  home with hospice support.  Patient's sister and boyfriend planning to assist with care at home.  Placed TOC consult to assist with coordination of home hospice referral.  - Patient denies having HCPOA.  Inquired if patient was unable to make medical decisions for herself, who would she want to make medical decisions on her behalf.  Patient noted she would want her long-term boyfriend, Kristina Howe, to make medical decisions for her.  Patient stated that she does have an adult son who is over we are 28 years old.  Discussed importance of completing ACP documentation naming her boyfriend's HCPOA if she would like him to be her medical decision-maker if she is unable to make medical decisions on her behalf.  Have consulted chaplain to assist with completion of documentation.  -  Code Status: Do not attempt resuscitation (DNR) - Comfort care   # Symptom management Patient is receiving these palliative interventions for symptom management with an intent to improve quality of life.   - Shortness of breath/pain, acute and severe in setting of metastatic non-small cell lung cancer   - Start oral morphine  7.5-15 mg every 4 hours as needed   - Change IV morphine  to 4 mg every hour as needed breakthrough after oral opioids   - Start senna 1 tab twice daily to prevent constipation.  Continue to adjust based on patient's symptom burden.   - Anxiety/agitation   - Continue oral Ativan  1 mg every 4 hours as needed   - Change IV Ativan  to 1 mg every 4 hours as needed breakthrough after oral Ativan   # Psycho-social/Spiritual Support:  - Support System: Sister, boyfriend, adult son - Desire for further Chaplain support:yes  # Discharge Planning:  Home with Hospice  Thank you for allowing the palliative care team to participate in the care Kristina Howe.  Tinnie Radar, DO Palliative Care Provider PMT # [phone]  If patient remains symptomatic despite maximum doses, please call PMT at [phone] between  0700 and 1900. Outside of these hours, please call attending, as PMT does not have night coverage.  Billing based on MDM: High  Problems Addressed: One or more chronic illnesses with severe exacerbation, progression, or side effects of treatment.  Amount and/or Complexity of Data: Category 1:Review of prior external note(s) from each unique source, Review of the result(s) of each unique test, and Assessment requiring an independent historian(s)  Risks: Parenteral controlled substances

## 2024-06-26 NOTE — ED Notes (Signed)
 Critical care paged for [place], [place]

## 2024-06-26 NOTE — Consult Note (Addendum)
 NAME:  Kristina Howe, MRN:  [id number], DOB:  04-20-1961, LOS: 0 ADMISSION DATE:  06/26/2024, CONSULTATION DATE:  06/26/2024 REFERRING MD: Nettie Earing, MD, CHIEF COMPLAINT:  SOB for one week   History of Present Illness:  A 63 yr old female patient with HTN, dyslipidemia, stage IVB (T1b, N3, M1b) non-small cell lung cancer (adenocarcinoma), with brain mets, s/p brain radiation, and currently on chest radiation for bulky LNE and malignant nodules in the right lower lobe, left upper lobe, enlarging lymphadenopathy in the mediastinum, subcarinal and supraclavicular diagnosed in May 2025.  She has emphysema Albuterol  PRN and home O2 @ 3 L [place] since one month. She has significant wt loss (50 ib), dysphagia, fatigue, and DOE one month, with minimal exertion, such as walking to the bathroom. She has limited QOL due to DOE. Dx with PE in June 2025, on therapeutic Lovenox . Smoked 1 ppd for 45 yrs, quit March 2025. No alcohol  or illicit drug use. She presented to ED with SOB after radiation session. She gets this each time she has chest radiation. She has chronic right sided chest pain. Denied f/c/r, cough, wheezing, LL edema, rash, URTI symptoms, N/V/D, or abd pain. SpO2 58% per EMS and started on NRM, SpO2 went up to 75%. Placed on BiPAP 15/8/100%, back up rate 15, with SpO2 89-91%. She feels better now.   Pertinent  Medical History  Anxiety, PE, stage IVB (T1b, N3, M1b) non-small cell lung cancer (adenocarcinoma), with brain mets, s/p brain radiation, and currently on chest radiation, emphysema, chronic hypoxic reps failure, HTN, dyslipidemia   Significant Hospital Events: Including procedures, antibiotic start and stop dates in addition to other pertinent events   06/26/2024, ED: SpO2 58% per EMS and started on NRM, SpO2 went up to 75%. Placed on BiPAP 15/8/100%, back up rate 15, with SpO2 89-91%. Given 125 mg IV solumedrol, 0.5 Atrovent  Neb, 2 g IV mag, and albuterol  Neb  Interim History / Subjective:     Objective    Blood pressure 103/75, pulse (!) 121, temperature 98.3 F (36.8 C), temperature source Axillary, resp. rate (!) 30, SpO2 90%.    FiO2 (%):  [100 %] 100 % PEEP:  [8 cmH20] 8 cmH20 Pressure Support:  [7 cmH20] 7 cmH20  No intake or output data in the 24 hours ending 06/26/24 0525 There were no vitals filed for this visit.  Examination: General: chronically ill, alert, oriented x4, and comfortable. On BiPAP. SpO2 90%. Cachetic  HENT: PERL. No LNE or thyromegaly. No JVD Lungs: fair air entry bilaterally, left >right. Basal crackles. No wheezing Cardiovascular: NL S1/S2. No m/g/r Abdomen: no distension or tenderness Extremities: no edema. Symmetrical  Neuro: nonfocal    Resolved problem list   Assessment and Plan  Acute on chronic hypoxic resp failure due to RLL/RML collapse due to malignant obstruction of bronchus intermedius, severe emphysema, and PE Stage IVB (T1b, N3, M1b) lung adenocarcinoma, with brain mets, bulky LNE, dx in May 2025 Anxiety Dysphagia  Significant wt loss and DOE HTN Dyslipidemia Anemia of chronic illness HypoNa  D/w the patient, boy friend (partner for many years) and sister. Poor outcome if she gets intubated. She will not able to be extubated. She states that she needs to rest. She agrees to DNR/DNI, palliative care and hospice (prefers home hospice).  Comfort care will be initiated now per her wishes. Hospitalist team to admit.   PCCM will sign off   Best Practice (right click and Reselect all SmartList Selections daily)  Labs   CBC: Recent Labs  Lab 06/22/24 1244 06/26/24 0436 06/26/24 0507  WBC 6.2 7.9  --   NEUTROABS 5.1  --   --   HGB 9.8* 9.7* 9.9*  HCT 31.0* 32.0* 29.0*  MCV 79.9* 83.8  --   PLT 204 149*  --     Basic Metabolic Panel: Recent Labs  Lab 06/22/24 1244 06/26/24 0507  NA 132* 132*  K 4.0 3.8  CL 97* 102  CO2 26  --   GLUCOSE 112* 195*  BUN 16 16  CREATININE 0.60 0.60  CALCIUM  9.7  --     GFR: Estimated Creatinine Clearance: 51.8 mL/min (by C-G formula based on SCr of 0.6 mg/dL). Recent Labs  Lab 06/22/24 1244 06/26/24 0436  WBC 6.2 7.9    Liver Function Tests: Recent Labs  Lab 06/22/24 1244  AST 14*  ALT 7  ALKPHOS 88  BILITOT 0.7  PROT 7.3  ALBUMIN  3.2*   No results for input(s): LIPASE, AMYLASE in the last 168 hours. No results for input(s): AMMONIA in the last 168 hours.  ABG    Component Value Date/Time   PHART 7.341 (L) 03/11/2024 1329   PCO2ART 47.1 03/11/2024 1329   PO2ART 195 (H) 03/11/2024 1329   HCO3 21.3 06/26/2024 0430   TCO2 22 06/26/2024 0507   ACIDBASEDEF 3.4 (H) 06/26/2024 0430   O2SAT 15.7 06/26/2024 0430     Coagulation Profile: No results for input(s): INR, PROTIME in the last 168 hours.  Cardiac Enzymes: No results for input(s): CKTOTAL, CKMB, CKMBINDEX, TROPONINI in the last 168 hours.  HbA1C: Hgb A1c MFr Bld  Date/Time Value Ref Range Status  03/08/2024 07:54 AM 6.1 (H) 4.8 - 5.6 % Final    Comment:    (NOTE) Pre diabetes:          5.7%-6.4%  Diabetes:              >6.4%  Glycemic control for   <7.0% adults with diabetes     CBG: No results for input(s): GLUCAP in the last 168 hours.  Review of Systems:   As HPI  Past Medical History:  She,  has a past medical history of Anemia (04/02/2024), Anxiety, Cancer (HCC), Elevated cholesterol, Hypertension, and Subclavian artery stenosis, right (HCC) (04/19/2024).   Surgical History:   Past Surgical History:  Procedure Laterality Date   ABDOMINAL HYSTERECTOMY     AORTOGRAM N/A 03/11/2024   Procedure: AORTIC ARCH ANGIOGRAM;  Surgeon: Pearline Norman RAMAN, MD;  Location: [facility] OR;  Service: Vascular;  Laterality: N/A;   BRONCHIAL BIOPSY  04/26/2024   Procedure: BRONCHOSCOPY, WITH BIOPSY;  Surgeon: Shelah Lamar RAMAN, MD;  Location: [facility] ENDOSCOPY;  Service: Pulmonary;;   BRONCHIAL BRUSHINGS  04/26/2024   Procedure: BRONCHOSCOPY, WITH BRUSH BIOPSY;   Surgeon: Shelah Lamar RAMAN, MD;  Location: [facility] ENDOSCOPY;  Service: Pulmonary;;   BRONCHIAL NEEDLE ASPIRATION BIOPSY  04/26/2024   Procedure: BRONCHOSCOPY, WITH NEEDLE ASPIRATION BIOPSY;  Surgeon: Shelah Lamar RAMAN, MD;  Location: [facility] ENDOSCOPY;  Service: Pulmonary;;   BRONCHIAL WASHINGS  04/26/2024   Procedure: IRRIGATION, BRONCHUS;  Surgeon: Shelah Lamar RAMAN, MD;  Location: [facility] ENDOSCOPY;  Service: Pulmonary;;   BRONCHOSCOPY, WITH BIOPSY USING ELECTROMAGNETIC NAVIGATION Right 04/26/2024   Procedure: BRONCHOSCOPY, WITH BIOPSY USING ELECTROMAGNETIC NAVIGATION;  Surgeon: Shelah Lamar RAMAN, MD;  Location: [facility] ENDOSCOPY;  Service: Pulmonary;  Laterality: Right;   COLONOSCOPY  2024   INSERTION OF RETROGRADE CAROTID STENT Right 03/11/2024   Procedure: INSERTION RIGHT SUBCLAVIAN ARTERY  AND RIGHT COMMON CAROTID ARTERY STENTS;  Surgeon: Pearline Norman RAMAN, MD;  Location: [facility] OR;  Service: Vascular;  Laterality: Right;   THORACIC EXPOSURE Right 03/11/2024   Procedure: DIRECT EXPOSURE OF RIGHT COMMON CAROTID ARTERY;  Surgeon: Pearline Norman RAMAN, MD;  Location: [facility] OR;  Service: Vascular;  Laterality: Right;   ULTRASOUND GUIDANCE FOR VASCULAR ACCESS Right 03/11/2024   Procedure: ULTRASOUND GUIDANCE, FOR VASCULAR ACCESS, RIGHT RADIAL ARTERY AND RIGHT FEMORAL ARTERY;  Surgeon: Pearline Norman RAMAN, MD;  Location: [facility] OR;  Service: Vascular;  Laterality: Right;   UPPER EXTREMITY ANGIOGRAM Right 03/11/2024   Procedure: GERALYN, RIGHT UPPER EXTREMITY;  Surgeon: Pearline Norman RAMAN, MD;  [facility];  Service: Vascular;  Laterality: Right;   UPPER GI ENDOSCOPY  2022   VIDEO BRONCHOSCOPY WITH ENDOBRONCHIAL ULTRASOUND Right 04/26/2024   Procedure: BRONCHOSCOPY, WITH EBUS;  Surgeon: Shelah Lamar RAMAN, MD;  Location: [facility] ENDOSCOPY;  Service: Pulmonary;  Laterality: Right;  Possible robotic navigation as well depending on CT results     Social History:   reports that she quit smoking about 30 years ago. Her smoking use included  cigarettes. She started smoking about 3 months ago. She has never been exposed to tobacco smoke. She has never used smokeless tobacco. She reports that she does not currently use alcohol . She reports that she does not use drugs.   Family History:  Her family history is not on file.   Allergies No Known Allergies   Home Medications  Prior to Admission medications   Medication Sig Start Date End Date Taking? Authorizing Provider  albuterol  (VENTOLIN  HFA) 108 (90 Base) MCG/ACT inhaler Inhale 2 puffs into the lungs every 6 (six) hours as needed for wheezing or shortness of breath. Patient taking differently: Inhale 2 puffs into the lungs every 4 (four) hours as needed for wheezing or shortness of breath. 05/18/24   Raenelle Coria, MD  ALPRAZolam  (XANAX ) 1 MG tablet Take 1 tablet (1 mg total) by mouth 3 (three) times daily as needed for anxiety. Patient taking differently: Take 1 mg by mouth 3 (three) times daily. 05/24/24   Haze Lonni PARAS, MD  atorvastatin  (LIPITOR) 40 MG tablet Take 1 tablet (40 mg total) by mouth daily. 03/12/24   Lue Elsie BROCKS, MD  enoxaparin  (LOVENOX ) 60 MG/0.6ML injection Inject 0.5 mLs (50 mg total) into the skin every 12 (twelve) hours. 05/18/24   Raenelle Coria, MD  ferrous sulfate  325 (65 FE) MG tablet Take 325 mg by mouth daily with breakfast.    [provider]  FLUoxetine  (PROZAC ) 20 MG capsule Take 20 mg by mouth daily.    [provider]  Multiple Vitamins-Minerals (MULTIVITAMIN WITH MINERALS) tablet Take 1 tablet by mouth daily with breakfast.    [provider]  oxyCODONE  (OXY IR/ROXICODONE ) 5 MG immediate release tablet Take 1 tablet (5 mg total) by mouth every 6 (six) hours as needed for severe pain (pain score 7-10). 05/18/24   Raenelle Coria, MD  triamterene -hydrochlorothiazide  (MAXZIDE -25) 37.5-25 MG per tablet Take 1 tablet by mouth daily.      [provider]  valsartan (DIOVAN) 320 MG tablet Take 320 mg by mouth  daily. 12/31/23   [provider]     Critical care time: 52 min    Mancel Ply, MD [place] [facility] Pager: see AMION

## 2024-06-27 DIAGNOSIS — Z7189 Other specified counseling: Secondary | ICD-10-CM | POA: Diagnosis not present

## 2024-06-27 DIAGNOSIS — C7931 Secondary malignant neoplasm of brain: Secondary | ICD-10-CM

## 2024-06-27 DIAGNOSIS — Z515 Encounter for palliative care: Principal | ICD-10-CM

## 2024-06-27 DIAGNOSIS — J9621 Acute and chronic respiratory failure with hypoxia: Secondary | ICD-10-CM | POA: Diagnosis not present

## 2024-06-27 DIAGNOSIS — Z66 Do not resuscitate: Secondary | ICD-10-CM

## 2024-06-27 DIAGNOSIS — R4589 Other symptoms and signs involving emotional state: Secondary | ICD-10-CM

## 2024-06-27 DIAGNOSIS — Z79899 Other long term (current) drug therapy: Secondary | ICD-10-CM

## 2024-06-27 DIAGNOSIS — C3490 Malignant neoplasm of unspecified part of unspecified bronchus or lung: Secondary | ICD-10-CM | POA: Diagnosis not present

## 2024-06-27 MED ORDER — MORPHINE BOLUS VIA INFUSION: 2.0000 mg | INTRAVENOUS | Status: DC | PRN

## 2024-06-27 MED ORDER — MORPHINE 100MG IN NS 100ML (1MG/ML) PREMIX INFUSION
4.0000 mg/h | INTRAVENOUS | Status: DC
  Administered 2024-06-27: 4 mg/h via INTRAVENOUS
  Filled 2024-06-27: qty 100

## 2024-06-27 MED ORDER — CARMEX CLASSIC LIP BALM EX OINT
TOPICAL_OINTMENT | CUTANEOUS | Status: DC | PRN
  Filled 2024-06-27: qty 10

## 2024-06-27 NOTE — Plan of Care (Signed)
   Problem: Education: Goal: Knowledge of General Education information will improve Description: Including pain rating scale, medication(s)/side effects and non-pharmacologic comfort measures Outcome: Progressing   Problem: Activity: Goal: Risk for activity intolerance will decrease Outcome: Progressing   Problem: Elimination: Goal: Will not experience complications related to bowel motility Outcome: Progressing

## 2024-06-27 NOTE — Progress Notes (Addendum)
 Unit Secretary called RT to report that PT has low Sp02 while on an oxygen mask, also stated PT is Comfort Care. RT asked what type of 02 mask then Secretary connected me with PT RN. RN states that PT is on 10 LPM Non Rebreather mask with Sp02 in the 50's. RN confirms PT is Comfort Care but has concerns with PT being on 10 LPM Non Rebreather- she doesn't feel that is Comfort Care. RT asked what our goals are and would she feel better if PT was not on a non rebreather that RT could place on another device. RN wishes to speak to MD and requested my contact information if MD has questions- information was given. RT encouraged RN to contact RT if there is anything RT can do.  Follow up conversation with RN- can run non rebreather up to 15 LPM.

## 2024-06-27 NOTE — TOC Progression Note (Addendum)
 Transition of Care [facility]) - Progression Note    Patient Details  Name: Kristina Howe MRN: [id number] Date of Birth: 24-Mar-1961  Transition [facility]) CM/SW Contact  Lorraine LILLETTE Fenton, LCSW Phone Number: 06/27/2024, 12:13 PM  Clinical Narrative:    When ready, Pt will DC with HH/[facility] selected and engaged.  [place] to [place] who verified she will reach out to family. TOC following.  Addendum: [facility] on site to work with pt/family.  CSW invited to [facility]. Pt/family would like boyfriend Chyrl to be present  to help make decisions. Setting up a tentative meeting for tomorrow at 10am. [facility] will be able to get pain med  pump  tomorrow, family agreed to plan leave tomorrow.  TOC following.    Barriers to Discharge: Continued Medical Work up  Expected Discharge Plan and Services         Expected Discharge Date: 2024/07/01                                     Social Determinants of Health (SDOH) Interventions SDOH Screenings   Food Insecurity: No Food Insecurity (06/26/2024)  Housing: Low Risk  (06/26/2024)  Transportation Needs: No Transportation Needs (06/26/2024)  Utilities: Not At Risk (06/26/2024)  Depression (PHQ2-9): Low Risk  (05/11/2024)  Social Connections: Moderately Integrated (06/26/2024)  Tobacco Use: Medium Risk (06/26/2024)    Readmission Risk Interventions    06/02/2024    2:39 PM  Readmission Risk Prevention Plan  Transportation Screening Complete  PCP or Specialist Appt within 3-5 Days Complete  HRI or Home Care Consult Complete  Social Work Consult for Recovery Care Planning/Counseling Complete  Palliative Care Screening Complete  Medication Review Oceanographer) Complete

## 2024-06-27 NOTE — Progress Notes (Signed)
 PROGRESS NOTE    Kristina Howe  FMW:[id number] DOB: Nov 17, 1961 DOA: 06/26/2024 PCP: [name] BROCKS, FNP    Brief Narrative:   Kristina Howe is a 63 y.o. 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  Over the last week, patient with progressive dyspnea now requiring increased oxygen needs (was 3L over the last 1 month).  Upon ambulating to the bathroom her O2 saturation has been dropping into the low 90s.  Underwent radiation treatment day prior to ED presentation where she has severe shortness of breath which does happen commonly for her.  In the ED, temperature 98.3 F, HR 126, RR 32, BP 137/83, SpO2 82% on 3 L nasal cannula placed on 15 L NRB.  VBG with pH 7.38, PCO236, PO2 less than 31.  WBC 7.9, hemoglobin 9.9, platelet count 149.  Sodium 132, potassium 3.8, chloride 102, CO2 20, glucose 195, BUN 16, creatinine 0.60.  AST 21, ALT 15, total Ruben 0.9.  High sensitive troponin 302.  Chest x-ray with stable malignant appearance of the right lung and mediastinum since CTA chest dated 06/14/2024, no new cardiopulmonary abnormality identified.  Seen in consultation by pulmonary critical care medicine, palliative care and family and patient decided to pursue comfort care.  TRH consulted for admission for assistance with transitioning to hospice, likely home.  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  Currently undergoing chest radiation.  Has been previously maintained on 3 L nasal cannula outpatient but becoming hypoxic and was noted to be 82% on 3 L cannula and placed on nonrebreather at the time of admission.   VBG with normal pH, PCO2 within normal limits, PO2 less than 31.  Chest x-ray with no new cardiopulmonary abnormality identified, noted stable malignant appearance right lung and mediastinum.  Seen by pulmonary critical care medicine and palliative care and patient/family have agreed to transition to comfort measures/hospice. -- Palliative care following, appreciate assistance -- Morphine  drip -- Ativan  as needed  -- Supplemental oxygen for comfort -- Hospice consulted, may consider residential versus home hospice -- Prognosis guarded   DVT prophylaxis: Comfort measures    Code Status: Do not attempt resuscitation (DNR) - Comfort care Family Communication: Sister present at bedside this morning  Disposition Plan:  Level of care: Med-Surg Status is: Inpatient Remains inpatient appropriate because: Awaiting hospice to be set up, home versus residential    Consultants:  PCCM Palliative care  Procedures:  None  Antimicrobials:  None   Subjective: Patient seen examined bedside, lying in bed.  Sister present at bedside.  Patient with recurrent hypoxic moment this morning with attempt to ambulate to the bathroom on nonrebreather.  Discussed with palliative care, Dr. Clayton this morning.  Given patient's continued anxiety, dyspnea starting on morphine  drip.  Patient has been referred to hospice to consider residential versus home options.  Overall prognosis guarded.  No other specific complaints, questions or concerns at this time.  Denies headache, no chest pain, no abdominal pain, no fever/chills, no nausea/vomiting/diarrhea.  No acute events overnight per nurse staff.  Objective: Vitals:   06/26/24 1235 06/26/24 1236 06/26/24 1617 06/27/24 0616  BP:    ROLLEN)  111/92  Pulse: (!) 112 (!) 110  (!) 111  Resp:    18  Temp:    97.8 F (36.6 C)  TempSrc:    Oral  SpO2: 94% 95%  (!) 80%  Weight:   44.2 kg   Height:   5' 7 (1.702 m)     Intake/Output Summary (Last 24 hours) at 06/27/2024  1316 Last data filed at 06/26/2024 2308 Gross per 24 hour  Intake 240 ml  Output --  Net 240 ml   Filed Weights   06/26/24 1617  Weight: 44.2 kg    Examination:  Physical Exam: GEN: NAD, alert and oriented x 3, chronically ill in appearance, dyspneic HEENT: NCAT, PERRL, EOMI, sclera clear, MMM PULM: Breath sounds diminished bases, slightly increased respiratory effort without accessory muscle use, on 10 L NRB CV: Tachycardic, regular rhythm w/o M/G/R GI: abd soft, NTND, + BS MSK: no peripheral edema    Data Reviewed: I have personally reviewed following labs and imaging studies  CBC: Recent Labs  Lab 06/22/24 1244 06/26/24 0436 06/26/24 0507  WBC 6.2 7.9  --   NEUTROABS 5.1  --   --   HGB 9.8* 9.7* 9.9*  HCT 31.0* 32.0* 29.0*  MCV 79.9* 83.8  --   PLT 204 149*  --    Basic Metabolic Panel: Recent Labs  Lab 06/22/24 1244 06/26/24 0436 06/26/24 0507  NA 132* 134* 132*  K 4.0 3.7 3.8  CL 97* 100 102  CO2 26 20*  --   GLUCOSE 112* 198* 195*  BUN 16 15 16   CREATININE 0.60 0.72 0.60  CALCIUM  9.7 9.6  --    GFR: Estimated Creatinine Clearance: 51.5 mL/min (by C-G formula based on SCr of 0.6 mg/dL). Liver Function Tests: Recent Labs  Lab 06/22/24 1244 06/26/24 0436  AST 14* 21  ALT 7 15  ALKPHOS 88 89  BILITOT 0.7 0.9  PROT 7.3 7.2  ALBUMIN  3.2* 2.7*   No results for input(s): LIPASE, AMYLASE in the last 168 hours. No results for input(s): AMMONIA in the last 168 hours. Coagulation Profile: No results for input(s): INR, PROTIME in the last 168 hours. Cardiac Enzymes: No results for input(s): CKTOTAL, CKMB, CKMBINDEX, TROPONINI in the last 168 hours. BNP (last 3 results) No results for input(s): PROBNP in the last 8760 hours. HbA1C: No results for input(s): HGBA1C in the last 72 hours. CBG: No results for input(s): GLUCAP in the last 168 hours. Lipid Profile: No results for input(s): CHOL, HDL, LDLCALC, TRIG,  CHOLHDL, LDLDIRECT in the last 72 hours. Thyroid Function Tests: No results for input(s): TSH, T4TOTAL, FREET4, T3FREE, THYROIDAB in the last 72 hours. Anemia Panel: No results for input(s): VITAMINB12, FOLATE, FERRITIN, TIBC, IRON, RETICCTPCT in the last 72 hours. Sepsis Labs: No results for input(s): PROCALCITON, LATICACIDVEN in the last 168 hours.  No results found for this or any previous visit (from the past 240 hours).       Radiology Studies: DG Chest Port 1 View Result Date: 06/26/2024 CLINICAL DATA:  63 year old female with lung cancer undergoing treatment. Shortness of breath, respiratory distress. EXAM: PORTABLE CHEST 1 VIEW COMPARISON:  CTA chest 06/14/2024 and earlier. FINDINGS: Portable AP semi upright view at 0445 hours. Opacified right lung base related to obstructive tumor and drowned lung appears stable since the 06/14/2024 CTA. Abnormal mediastinal contour with bulky superior mediastinal lymphadenopathy and ex nodal disease also appears stable from the scout view at that time. Right superior mediastinal vascular stents are again  noted. Stable trachea. No superimposed pneumothorax, pulmonary edema or new lung opacity identified. No left pleural effusion. Paucity of bowel gas. Stable visualized osseous structures. IMPRESSION: 1. Stable malignant appearance of the right lung and mediastinum since 06/14/2024 CTA chest. 2. No new cardiopulmonary abnormality identified. Electronically Signed   By: VEAR Hurst M.D.   On: 06/26/2024 05:00        Scheduled Meds: Continuous Infusions:  morphine  4 mg/hr (06/27/24 1047)     LOS: 1 day    Time spent: 51 minutes spent on 06/27/2024 caring for this patient face-to-face including chart review, ordering labs/tests, documenting, discussion with nursing staff, consultants, updating family and interview/physical exam    Camellia PARAS [place], DO [facility] Available via Epic secure chat 7am-7pm After  these hours, please refer to coverage provider listed on [email or web address] 06/27/2024, 1:16 PM

## 2024-06-27 NOTE — Progress Notes (Signed)
 Daily Progress Note   Patient Name: Kristina Howe       Date: 06/27/2024 DOB: 04-May-1961  Age: 63 y.o. MRN#: [id number] Attending Physician: [place], Eric J, DO Primary Care Physician: [name] BROCKS, FNP Admit Date: 06/26/2024 Length of Stay: 1 day  Reason for Consultation/Follow-up: Establishing goals of care and symptom management  Subjective:   CC: Patient having shortness of breath.  Following up regarding complex medical decision making and symptom management.  Subjective:  Reviewed EMR including recent documentation from hospitalist, RN, and RT.  At time of EMR review in past 24 hours patient has received as needed IV morphine  4 mg x 2 doses and IV Ativan  1 mg x 1 dose. Discussed care with hospitalist, TOC, and RN for medical updates as well as planning for patient to go home with hospice.  Patient having worsening shortness of breath and refusing to remove nonrebreather to take pills.  ------------------------------------------------------------------------------------------------------------- Advance Care Planning Conversation  Pertinent diagnosis: History of PE, anxiety, stage IV non-small cell lung cancer with metastatic disease to brain, frailty, pulmonary emphysema, acute on chronic hypoxic respiratory failure  The patient and/or family consented to a voluntary Advance Care Planning Conversation in person. Individuals present for the conversation: Patient, patient's sister  Summary of the conversation:  Presented to bedside to see patient.  Patient's sister present at bedside as well.  Again able to introduce myself as a member of the palliative medicine team though they remember speaking with me yesterday.  Discussed patient's medical management moving forward.  Expressed the patient having worsening shortness of breath and this is causing her anxiety that is worsening.  Expressed management of this would be opioids for shortness of breath.  Patient and sister acknowledge  this.  Discussed based on patient's work of breathing which is clearly evident, would recommend continue with IV morphine  infusion for symptom management.  Spent time discussing that with IV morphine , goal would be for patient's comfort so that she could come off of nonrebreather at end-of-life.  Discussed IV morphine  to make patient sleepy and acknowledges that her time of life is shortening.  Patient and sister acknowledged this.  They noted priority is patient's comfort at this time.  Discussed that once patient is comfortable, can remove the nonrebreather because this is not comfortable for the patient.  They agree with this.  They both agree with starting patient on continuous IV morphine  infusion for symptom management at end-of-life.  Also discussed that with patient needing IV medications, would likely not be appropriate for patient to go home at this time.  Discussed patient could consider inpatient hospice for end-of-life management.  Discussed patient would have to be evaluated and approved for this.  Patient and sister agreeing with referral for inpatient hospice evaluation.  Patient specifically request to remain here in [place] and wants to go to the inpatient hospice on [facility].  Discussed this would be [facility] so noted would reach out to [facility] and TOC to assist with care coordination.  Spent time answering questions as able.  Noted palliative medicine team continue to follow along with patient's medical journey.  Outcome of the conversations and/or documents completed:  Planning for evaluation to inpatient hospice at this time due to patient's worsening symptom burden.  Continuing to adjust medications due to patient's high symptom burden.  Comfort is priority at this time.  I spent 25 minutes providing separately identifiable ACP services with the patient and/or surrogate decision maker in a voluntary, in-person conversation discussing  the patient's wishes and goals as  detailed in the above note.  Tinnie Radar, DO Palliative Medicine Provider  -------------------------------------------------------------------------------------------------------------  Discussed care with hospitalist, TOC, RN, and [facility] liaison during the day to coordinate care.  Review of Systems Shortness of breath Objective:   Vital Signs:  BP (!) 111/92 (BP Location: Left Arm)   Pulse (!) 111   Temp 97.8 F (36.6 C) (Oral)   Resp 18   Ht 5' 7 (1.702 m)   Wt 44.2 kg   SpO2 (!) 80%   BMI 15.26 kg/m   Physical Exam: General: Chronically ill-appearing, cachectic, frail Cardiovascular: Tachycardia noted Respiratory: increased work of breathing noted Neuro: A&Ox4, following commands easily Psych: appropriately answers all questions  Assessment & Plan:   Assessment: Patient is a 62 year old female with a past medical history of hypertension, hyperlipidemia, emphysema, history of PE, anxiety, and stage IV non-small cell lung cancer with metastatic disease to brain status post brain radiation currently undergoing chest radiation who was admitted on 06/26/2024 for management of worsening shortness of breath.  Upon presentation discussion with PCCM provider and hospitalist in ER, patient has elected to transition to full comfort focused measures with hope of getting home with hospice support.  Patient admitted to assist with coordination of care to make sure patient has equipment and hospice coordination upon discharge.   palliative medicine team consulted to assist with complex medical decision making and symptom management. Of note patient seen by palliative medicine team during prior hospitalization.  Recommendations/Plan: # Complex medical decision making/goals of care:   -Discussed care with patient while sister at bedside today as detailed above in HPI.  Patient having continued increased work of breathing in setting of comfort care.  Discussed pathways for medical care moving  forward including initiating continuous IV morphine  infusion to assist with management of breathing knowing patient's time is growing shorter.  Discussed could refer patient to inpatient hospice for evaluation due to her high symptom burden.  Patient and sister agreeing with supporting this.  Patient specifically request to remain here in [place] and seek hospice at inpatient location on [facility], explained this would be [facility].  Noted would reach out to [facility] liaison and TOC to coordinate.                - Patient denies having HCPOA.  Inquired if patient was unable to make medical decisions for herself, who would she want to make medical decisions on her behalf.  Patient noted she would want her long-term boyfriend, Chyrl Ducking, to make medical decisions for her.  Patient stated that she does have an adult son who is over we are 21 years old.  Discussed importance of completing ACP documentation naming her boyfriend's HCPOA if she would like him to be her medical decision-maker if she is unable to make medical decisions on her behalf.  Have consulted chaplain to assist with completion of documentation.                -  Code Status: Do not attempt resuscitation (DNR) - Comfort care    # Symptom management Patient is receiving these palliative interventions for symptom management with an intent to improve quality of life.                 - Shortness of breath/pain, acute and severe in setting of metastatic non-small cell lung cancer                               -  Start IV morphine  titratable continuous infusion with bolus dosing.  Oral morphine  7.5-15 mg every 4 hours as needed.  Continue to adjust based on patient's symptom burden.                  - Anxiety/agitation                               - Continue oral Ativan  1 mg every 4 hours as needed                               - Continue IV Ativan  to 1 mg every 4 hours as needed breakthrough after oral Ativan    # Psycho-social/Spiritual  Support:  - Support System: Sister, boyfriend, adult son - Desire for further Chaplain support:yes  # Discharge Planning: Hospice facility  - Patient to be evaluated for inpatient hospice with [facility]  Discussed with: Patient, patient's sister, hospitalist, [facility] liaison, RN, TOC  Thank you for allowing the palliative care team to participate in the care Cathlean HERO Kopper.  Tinnie Radar, DO Palliative Care Provider PMT # [phone]  If patient remains symptomatic despite maximum doses, please call PMT at [phone] between 0700 and 1900. Outside of these hours, please call attending, as PMT does not have night coverage.

## 2024-06-27 NOTE — Progress Notes (Signed)
 [facility] Room [phone]  [facility] Liaison Note  Referral received from [facility] for family interest in [facility].   Met with patient and family in room to explain services and hospice philosophy.Packet left. Patient requested that liaison come back on tomorrow when significant other present to discuss and help make decisions. Requested time of 10am. TOC and team notified of this. Will follow up on tomorrow.    Thank you for the opportunity to participate in this patient's care  Nat Babe, BSN, RN Hospice Nurse Liaison [phone]

## 2024-06-28 ENCOUNTER — Ambulatory Visit

## 2024-06-28 DIAGNOSIS — C3490 Malignant neoplasm of unspecified part of unspecified bronchus or lung: Secondary | ICD-10-CM | POA: Diagnosis not present

## 2024-06-29 NOTE — Radiation Completion Notes (Signed)
 Patient Name: Kristina Howe, Kristina Howe MRN: [id number] Date of Birth: 04-Aug-1961 Referring Physician: SHERROD SHERROD, M.D. Date of Service: 2024-06-29 Radiation Oncologist: Norleen Limes, M.D. 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  Intent: Palliative     ==========DELIVERED PLANS==========  First Treatment Date: 2024-06-15 Last Treatment Date: 2024-06-25   Plan Name: Lung_R Site: Lung, Right Technique: 3D Mode: Photon Dose Per Fraction: 3 Gy Prescribed Dose (Delivered / Prescribed): 27 Gy / 30 Gy Prescribed Fxs (Delivered / Prescribed): 9 / 10     ==========ON TREATMENT VISIT DATES========== 2024-06-18, 2024-06-25    See weekly On Treatment Notes in Epic for details in the Media tab (listed as Progress notes on the On Treatment Visit Dates listed above). Sadly the patient's respiratory status progressively worsened and she was admitted after her 9th treatment, and passed on July 05, 2024 in the hospital setting. We will be available as needed to the patient's family and medical team.      Donald KYM Husband, PAC

## 2024-07-05 ENCOUNTER — Ambulatory Visit: Admitting: Internal Medicine

## 2024-07-05 ENCOUNTER — Other Ambulatory Visit

## 2024-07-06 ENCOUNTER — Other Ambulatory Visit

## 2024-07-06 ENCOUNTER — Ambulatory Visit: Admitting: Internal Medicine

## 2024-07-07 ENCOUNTER — Encounter

## 2024-07-09 NOTE — Progress Notes (Signed)
    OVERNIGHT PROGRESS REPORT  Notified by RN that patient is deceased as of 63 Hrs  Patient was DNR/CMO (Comfort Measures Only) followed by Palliative.  2 RN verified.   Lynwood Kipper MSNA MSN ACNPC-AG Acute Care Nurse Practitioner [facility]

## 2024-07-09 NOTE — Death Summary Note (Signed)
 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 female with past medical history significant for HTN, HLD, stage IVb non-small cell lung cancer with brain metastasis s/p brain radiation and currently undergoing chest radiation who presented to [facility] ED on 2024-07-24 with severe shortness of breath.  Over the last week, patient with progressive dyspnea now requiring increased oxygen needs (was 3L over the last 1 month).  Upon ambulating to the bathroom her O2 saturation has been dropping into the low 90s.  Underwent radiation treatment day prior to ED presentation where she has severe shortness of breath which does happen commonly for her.   In the ED, temperature 98.3 F, HR 126, RR 32, BP 137/83, SpO2 82% on 3 L nasal cannula placed on 15 L NRB.  VBG with pH 7.38, PCO236, PO2 less than 31.  WBC 7.9, hemoglobin 9.9, platelet count 149.  Sodium 132, potassium 3.8, chloride 102, CO2 20, glucose 195, BUN 16, creatinine 0.60.  AST 21, ALT 15, total Ruben 0.9.  High sensitive troponin 302.  Chest x-ray with stable malignant appearance of the right lung and mediastinum since CTA chest dated 06/14/2024, no new cardiopulmonary abnormality identified.  Seen in  consultation by pulmonary critical care medicine, palliative care and family and patient decided to pursue comfort care.  TRH consulted for admission for assistance with transitioning to hospice, likely home.  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  Currently undergoing chest radiation.  Has been previously maintained on 3 L nasal cannula outpatient but becoming hypoxic and was noted to be 82% on 3 L cannula and placed on nonrebreather at the time of admission.  VBG with normal pH, PCO2 within normal limits, PO2 less than 31.  Chest x-ray with no new cardiopulmonary abnormality identified, noted stable malignant appearance right lung and mediastinum.  Seen by pulmonary critical care medicine and palliative care; and patient/family have agreed to transition to comfort measures/hospice.  Patient was started on morphine  drip.  Patient was pronounced deceased on 07/26/24 at 6:40 AM.  Procedures: None  Consultations: PCCM, palliative care  The results of significant diagnostics from this hospitalization (including imaging, microbiology, ancillary and laboratory) are listed below for reference.   Significant Diagnostic Studies: DG Chest Port 1 View Result Date: 2024-07-24 CLINICAL DATA:  63 year old female with lung cancer undergoing treatment. Shortness of breath, respiratory distress. EXAM: PORTABLE CHEST 1 VIEW COMPARISON:  CTA chest 06/14/2024 and earlier. FINDINGS: Portable AP semi upright view at 0445 hours. Opacified right lung base related to obstructive  tumor and drowned lung appears stable since the 06/14/2024 CTA. Abnormal mediastinal contour with bulky superior mediastinal lymphadenopathy and ex nodal disease also appears stable from the scout view at that time. Right superior mediastinal vascular  stents are again noted. Stable trachea. No superimposed pneumothorax, pulmonary edema or new lung opacity identified. No left pleural effusion. Paucity of bowel gas. Stable visualized osseous structures. IMPRESSION: 1. Stable malignant appearance of the right lung and mediastinum since 06/14/2024 CTA chest. 2. No new cardiopulmonary abnormality identified. Electronically Signed   By: VEAR Hurst M.D.   On: 06/26/2024 05:00   CT Angio Chest PE W and/or Wo Contrast Result Date: 06/14/2024 CLINICAL DATA:  Concern for pulmonary embolism.  Lung cancer. EXAM: CT ANGIOGRAPHY CHEST WITH CONTRAST TECHNIQUE: Multidetector CT imaging of the chest was performed using the standard protocol during bolus administration of intravenous contrast. Multiplanar CT image reconstructions and MIPs were obtained to evaluate the vascular anatomy. RADIATION DOSE REDUCTION: This exam was performed according to the departmental dose-optimization program which includes automated exposure control, adjustment of the mA and/or kV according to patient size and/or use of iterative reconstruction technique. CONTRAST:  75mL OMNIPAQUE  IOHEXOL  350 MG/ML SOLN COMPARISON:  CT dated 05/24/2024. FINDINGS: Cardiovascular: There is no cardiomegaly. Interval increase in the size of the pericardial effusion measuring approximately 1 cm in thickness anterior to the heart. There is mild atherosclerotic calcification of the thoracic aorta. No aneurysmal dilatation or dissection. Stents in the proximal right common carotid and subclavian arteries noted. The origins of the great vessels of the aortic arch are patent. Small nonocclusive right upper lobe segmental pulmonary artery embolus similar to prior CT. No new pulmonary embolus. No evidence of right heart straining. Mediastinum/Nodes: Bulky hilar and mediastinal adenopathy progressed since the prior CT. Bilateral supraclavicular adenopathy noted. There is mass effect and compression of the midportion of the  esophagus by the mediastinal adenopathy. No mediastinal fluid collection. Lungs/Pleura: There is complete occlusion of the bronchus intermedius and right middle and right lower lobe bronchi with complete collapse of the right middle and right lower lobes. This is likely related to tumor invasion. There is a small right pleural effusion. There is background of emphysema. Linear scarring in the left apex. No pneumothorax. Upper Abdomen: No acute abnormality. Musculoskeletal: Osteopenia.  No acute osseous pathology. Review of the MIP images confirms the above findings. IMPRESSION: 1. Small nonocclusive right upper lobe segmental pulmonary artery embolus similar to prior CT. No new pulmonary embolus. No evidence of right heart straining. 2. Complete occlusion of the bronchus intermedius and right middle and right lower lobe bronchi with complete collapse of the right middle and right lower lobes. 3. Small right pleural effusion. 4. Bulky hilar and mediastinal adenopathy progressed since the prior CT. 5. Aortic Atherosclerosis (ICD10-I70.0) and Emphysema (ICD10-J43.9). Electronically Signed   By: Vanetta Chou M.D.   On: 06/14/2024 11:55   CT Head Wo Contrast Result Date: 06/14/2024 CLINICAL DATA:  Mental status change, unknown cause EXAM: CT HEAD WITHOUT CONTRAST TECHNIQUE: Contiguous axial images were obtained from the base of the skull through the vertex without intravenous contrast. RADIATION DOSE REDUCTION: This exam was performed according to the departmental dose-optimization program which includes automated exposure control, adjustment of the mA and/or kV according to patient size and/or use of iterative reconstruction technique. COMPARISON:  MRI of the head dated May 26, 2024. FINDINGS: Brain: Age-related cerebral volume loss. Area of diminished attenuation within the left posterior temporal lobe corresponding with the ring-enhancing lesion noted on  the previous MRI. There is also moderate subcortical and  deep cerebral white matter disease. No discrete lesions are evident within the cerebellar hemispheres. Vascular: Negative. Skull: Unremarkable. Sinuses/Orbits: Normal. Other: None. IMPRESSION: 1. Focal area of diminished attenuation at the left temporal occipital junction corresponding with the likely metastatic lesion seen on previous MRI. The other lesions are not evident, but the current study is limited by the absence of intravenous contrast. 2. Moderate cerebral white matter disease. Electronically Signed   By: Evalene Coho M.D.   On: 06/14/2024 11:54   DG Chest Portable 1 View Result Date: 06/14/2024 CLINICAL DATA:  Shortness of breath, known stage IV lung cancer and COPD EXAM: PORTABLE CHEST 1 VIEW COMPARISON:  Prior chest x-ray 06/01/2024 FINDINGS: Unchanged contour of the mediastinum consistent with bulky mediastinal lymphadenopathy. Chronic bronchitic changes are similar compared to prior. Probable right middle and right lower lobe atelectasis is similar compared to prior. No new effusion or pneumothorax. No acute osseous abnormality. IMPRESSION: Stable appearance of the chest without significant interval change compared to 06/01/2024. Right middle and right lower lobe atelectasis with bulky mediastinal adenopathy. Electronically Signed   By: Wilkie Lent M.D.   On: 06/14/2024 08:15   DG Chest Portable 1 View Result Date: 06/01/2024 CLINICAL DATA:  Shortness of breath EXAM: PORTABLE CHEST 1 VIEW COMPARISON:  05/23/2024 FINDINGS: Opacity in the right lower hemithorax could reflect a combination of airspace disease and effusion. Left lung clear. Heart is normal size. Again noted is bulky mediastinal adenopathy as seen on prior CT. No acute bony abnormality. IMPRESSION: New opacification in the right lower chest may reflect a combination of effusion and airspace disease/collapse. Bulky mediastinal adenopathy. Electronically Signed   By: Franky Crease M.D.   On: 06/01/2024 16:17     Microbiology: No results found for this or any previous visit (from the past 240 hours).  Time spent: 32 minutes  Signed: Camellia PARAS [place], DO 22-Jul-2024

## 2024-07-09 DEATH — deceased

## 2024-07-12 ENCOUNTER — Ambulatory Visit

## 2024-07-15 ENCOUNTER — Ambulatory Visit: Admitting: Emergency Medicine

## 2024-09-07 ENCOUNTER — Other Ambulatory Visit

## 2024-09-13 ENCOUNTER — Ambulatory Visit: Admitting: Radiation Oncology

## 2024-09-13 ENCOUNTER — Encounter

## 2024-10-12 ENCOUNTER — Other Ambulatory Visit (HOSPITAL_COMMUNITY): Payer: Self-pay

## 2024-10-15 ENCOUNTER — Encounter (HOSPITAL_COMMUNITY)

## 2024-10-15 ENCOUNTER — Ambulatory Visit: Admitting: Vascular Surgery

## 2024-10-22 ENCOUNTER — Other Ambulatory Visit (HOSPITAL_COMMUNITY): Payer: Self-pay
